# Patient Record
Sex: Female | Born: 1970 | Race: White | Hispanic: No | Marital: Married | Smoking: Former smoker
Health system: Southern US, Community
[De-identification: ages and names within clinical notes are randomized; demographics above are authoritative.]

## PROBLEM LIST (undated history)

## (undated) DIAGNOSIS — E669 Obesity, unspecified: Secondary | ICD-10-CM

## (undated) DIAGNOSIS — E282 Polycystic ovarian syndrome: Secondary | ICD-10-CM

## (undated) DIAGNOSIS — E559 Vitamin D deficiency, unspecified: Secondary | ICD-10-CM

## (undated) DIAGNOSIS — G9332 Myalgic encephalomyelitis/chronic fatigue syndrome: Secondary | ICD-10-CM

## (undated) DIAGNOSIS — D649 Anemia, unspecified: Secondary | ICD-10-CM

## (undated) DIAGNOSIS — R7989 Other specified abnormal findings of blood chemistry: Secondary | ICD-10-CM

## (undated) DIAGNOSIS — I10 Essential (primary) hypertension: Secondary | ICD-10-CM

## (undated) DIAGNOSIS — F419 Anxiety disorder, unspecified: Secondary | ICD-10-CM

## (undated) DIAGNOSIS — M2550 Pain in unspecified joint: Secondary | ICD-10-CM

## (undated) DIAGNOSIS — M1990 Unspecified osteoarthritis, unspecified site: Secondary | ICD-10-CM

## (undated) DIAGNOSIS — K589 Irritable bowel syndrome without diarrhea: Secondary | ICD-10-CM

## (undated) DIAGNOSIS — E039 Hypothyroidism, unspecified: Secondary | ICD-10-CM

## (undated) DIAGNOSIS — K829 Disease of gallbladder, unspecified: Secondary | ICD-10-CM

## (undated) DIAGNOSIS — E88819 Insulin resistance, unspecified: Secondary | ICD-10-CM

## (undated) DIAGNOSIS — I493 Ventricular premature depolarization: Secondary | ICD-10-CM

## (undated) DIAGNOSIS — R002 Palpitations: Secondary | ICD-10-CM

## (undated) DIAGNOSIS — M549 Dorsalgia, unspecified: Secondary | ICD-10-CM

## (undated) DIAGNOSIS — E8881 Metabolic syndrome: Secondary | ICD-10-CM

## (undated) DIAGNOSIS — E7800 Pure hypercholesterolemia, unspecified: Secondary | ICD-10-CM

## (undated) DIAGNOSIS — E785 Hyperlipidemia, unspecified: Secondary | ICD-10-CM

## (undated) DIAGNOSIS — R5382 Chronic fatigue, unspecified: Secondary | ICD-10-CM

## (undated) HISTORY — PX: CHOLECYSTECTOMY: SHX55

## (undated) HISTORY — DX: Chronic fatigue, unspecified: R53.82

## (undated) HISTORY — DX: Anxiety disorder, unspecified: F41.9

## (undated) HISTORY — DX: Hyperlipidemia, unspecified: E78.5

## (undated) HISTORY — DX: Ventricular premature depolarization: I49.3

## (undated) HISTORY — DX: Myalgic encephalomyelitis/chronic fatigue syndrome: G93.32

## (undated) HISTORY — DX: Other specified abnormal findings of blood chemistry: R79.89

## (undated) HISTORY — DX: Pain in unspecified joint: M25.50

## (undated) HISTORY — DX: Palpitations: R00.2

## (undated) HISTORY — DX: Pure hypercholesterolemia, unspecified: E78.00

## (undated) HISTORY — DX: Insulin resistance, unspecified: E88.819

## (undated) HISTORY — DX: Essential (primary) hypertension: I10

## (undated) HISTORY — DX: Hypothyroidism, unspecified: E03.9

## (undated) HISTORY — DX: Disease of gallbladder, unspecified: K82.9

## (undated) HISTORY — DX: Unspecified osteoarthritis, unspecified site: M19.90

## (undated) HISTORY — DX: Irritable bowel syndrome, unspecified: K58.9

## (undated) HISTORY — DX: Anemia, unspecified: D64.9

## (undated) HISTORY — DX: Vitamin D deficiency, unspecified: E55.9

## (undated) HISTORY — DX: Dorsalgia, unspecified: M54.9

## (undated) HISTORY — DX: Polycystic ovarian syndrome: E28.2

## (undated) HISTORY — DX: Obesity, unspecified: E66.9

## (undated) HISTORY — DX: Metabolic syndrome: E88.81

## (undated) HISTORY — PX: BREAST REDUCTION SURGERY: SHX8

---

## 2005-07-31 ENCOUNTER — Emergency Department (HOSPITAL_COMMUNITY): Admission: EM | Admit: 2005-07-31 | Discharge: 2005-07-31 | Payer: Self-pay | Admitting: Emergency Medicine

## 2005-08-28 ENCOUNTER — Ambulatory Visit: Payer: Self-pay | Admitting: *Deleted

## 2005-08-28 ENCOUNTER — Encounter: Payer: Self-pay | Admitting: Cardiovascular Disease

## 2005-09-17 ENCOUNTER — Ambulatory Visit: Payer: Self-pay | Admitting: *Deleted

## 2005-10-21 ENCOUNTER — Ambulatory Visit: Payer: Self-pay | Admitting: Cardiology

## 2005-10-22 ENCOUNTER — Ambulatory Visit: Payer: Self-pay

## 2005-12-10 ENCOUNTER — Emergency Department (HOSPITAL_COMMUNITY): Admission: EM | Admit: 2005-12-10 | Discharge: 2005-12-11 | Payer: Self-pay | Admitting: Emergency Medicine

## 2006-04-10 ENCOUNTER — Ambulatory Visit: Payer: Self-pay | Admitting: Family Medicine

## 2006-08-30 ENCOUNTER — Emergency Department (HOSPITAL_COMMUNITY): Admission: EM | Admit: 2006-08-30 | Discharge: 2006-08-30 | Payer: Self-pay | Admitting: Emergency Medicine

## 2006-11-19 ENCOUNTER — Ambulatory Visit: Payer: Self-pay | Admitting: Internal Medicine

## 2007-06-08 DIAGNOSIS — F411 Generalized anxiety disorder: Secondary | ICD-10-CM | POA: Insufficient documentation

## 2007-06-08 DIAGNOSIS — I10 Essential (primary) hypertension: Secondary | ICD-10-CM | POA: Insufficient documentation

## 2007-06-10 ENCOUNTER — Ambulatory Visit: Payer: Self-pay | Admitting: Family Medicine

## 2007-06-10 DIAGNOSIS — S335XXA Sprain of ligaments of lumbar spine, initial encounter: Secondary | ICD-10-CM | POA: Insufficient documentation

## 2007-11-01 ENCOUNTER — Inpatient Hospital Stay (HOSPITAL_COMMUNITY): Admission: EM | Admit: 2007-11-01 | Discharge: 2007-11-03 | Payer: Self-pay | Admitting: Emergency Medicine

## 2007-11-02 ENCOUNTER — Ambulatory Visit: Payer: Self-pay | Admitting: Internal Medicine

## 2007-11-03 ENCOUNTER — Encounter: Payer: Self-pay | Admitting: Family Medicine

## 2007-11-04 ENCOUNTER — Ambulatory Visit: Payer: Self-pay | Admitting: Family Medicine

## 2007-11-04 DIAGNOSIS — R1031 Right lower quadrant pain: Secondary | ICD-10-CM | POA: Insufficient documentation

## 2007-11-04 DIAGNOSIS — R1032 Left lower quadrant pain: Secondary | ICD-10-CM

## 2007-11-25 ENCOUNTER — Ambulatory Visit: Payer: Self-pay | Admitting: Gastroenterology

## 2008-01-26 ENCOUNTER — Encounter: Payer: Self-pay | Admitting: Family Medicine

## 2008-04-26 ENCOUNTER — Ambulatory Visit: Payer: Self-pay | Admitting: Family Medicine

## 2008-04-26 DIAGNOSIS — R002 Palpitations: Secondary | ICD-10-CM | POA: Insufficient documentation

## 2008-06-03 ENCOUNTER — Ambulatory Visit: Payer: Self-pay | Admitting: Family Medicine

## 2008-06-07 ENCOUNTER — Encounter: Payer: Self-pay | Admitting: Family Medicine

## 2008-08-08 ENCOUNTER — Telehealth: Payer: Self-pay | Admitting: Family Medicine

## 2008-12-01 ENCOUNTER — Telehealth: Payer: Self-pay | Admitting: Family Medicine

## 2009-01-10 ENCOUNTER — Telehealth: Payer: Self-pay | Admitting: Family Medicine

## 2009-08-23 ENCOUNTER — Telehealth: Payer: Self-pay | Admitting: Family Medicine

## 2009-10-25 ENCOUNTER — Telehealth: Payer: Self-pay | Admitting: Family Medicine

## 2009-11-14 ENCOUNTER — Telehealth: Payer: Self-pay | Admitting: Family Medicine

## 2010-09-10 ENCOUNTER — Other Ambulatory Visit: Payer: Self-pay | Admitting: Family Medicine

## 2010-09-10 ENCOUNTER — Encounter: Payer: Self-pay | Admitting: Family Medicine

## 2010-09-10 ENCOUNTER — Ambulatory Visit
Admission: RE | Admit: 2010-09-10 | Discharge: 2010-09-10 | Payer: Self-pay | Source: Home / Self Care | Attending: Family Medicine | Admitting: Family Medicine

## 2010-09-11 LAB — HEPATIC FUNCTION PANEL
ALT: 52 U/L — ABNORMAL HIGH (ref 0–35)
AST: 37 U/L (ref 0–37)
Albumin: 4.5 g/dL (ref 3.5–5.2)
Alkaline Phosphatase: 57 U/L (ref 39–117)
Total Bilirubin: 0.5 mg/dL (ref 0.3–1.2)

## 2010-09-11 LAB — CBC WITH DIFFERENTIAL/PLATELET
Basophils Absolute: 0.1 10*3/uL (ref 0.0–0.1)
Eosinophils Relative: 2.1 % (ref 0.0–5.0)
HCT: 46.2 % — ABNORMAL HIGH (ref 36.0–46.0)
Hemoglobin: 15.8 g/dL — ABNORMAL HIGH (ref 12.0–15.0)
Lymphocytes Relative: 27.2 % (ref 12.0–46.0)
Lymphs Abs: 2.7 10*3/uL (ref 0.7–4.0)
Monocytes Relative: 5.3 % (ref 3.0–12.0)
Neutro Abs: 6.4 10*3/uL (ref 1.4–7.7)
WBC: 9.9 10*3/uL (ref 4.5–10.5)

## 2010-09-11 LAB — BASIC METABOLIC PANEL
Calcium: 9.6 mg/dL (ref 8.4–10.5)
GFR: 114.8 mL/min (ref >60.00)
Potassium: 4.7 mEq/L (ref 3.5–5.1)
Sodium: 139 mEq/L (ref 135–145)

## 2010-09-11 LAB — LIPID PANEL
HDL: 59.8 mg/dL (ref >39.00)
VLDL: 27 mg/dL (ref 0.0–40.0)

## 2010-09-11 LAB — LDL CHOLESTEROL, DIRECT: Direct LDL: 194.4 mg/dL

## 2010-09-11 LAB — TSH: TSH: 2.14 u[IU]/mL (ref 0.35–5.50)

## 2010-09-11 NOTE — Progress Notes (Signed)
Summary: refill xanax  Phone Note Refill Request Message from:  Patient on August 23, 2009 2:16 PM  Refills Requested: Medication #1:  ALPRAZOLAM 0.5 MG  TABS three times a day as needed anxiety [facility]   Method Requested: Electronic Initial call taken by: Alfred Levins, CMA,  August 23, 2009 2:16 PM  Follow-up for Phone Call        call in #30 only with no rf. She needs an OV before any after that Follow-up by: Nelwyn Salisbury MD,  August 23, 2009 4:05 PM  Additional Follow-up for Phone Call Additional follow up Details #1::        Rx called to pharmacy Additional Follow-up by: Alfred Levins, CMA,  August 23, 2009 5:14 PM    Prescriptions: ALPRAZOLAM 0.5 MG  TABS (ALPRAZOLAM) three times a day as needed anxiety  #90 x 0   Entered by:   Alfred Levins, CMA   Authorized by:   Nelwyn Salisbury MD   Signed by:   Alfred Levins, CMA on 08/23/2009   Method used:   Telephoned to ...       [facility]  [address]  [phone]* (retail)       [address]       [place], [place]  [address]       Ph: [phone] or [phone]       Fax: [phone]   RxID:   [id number]

## 2010-09-11 NOTE — Progress Notes (Signed)
Summary: refill xanax  Phone Note From Pharmacy   Caller: [facility]  [address]  [phone]* Call For: refill  Summary of Call: request for refill on xanax 0.5 mg  three times a day prn Initial call taken by: Duard Brady LPN,  November 14, 2009 9:01 AM  Follow-up for Phone Call        as I said last time, NO, not without an OV Follow-up by: Nelwyn Salisbury MD,  November 14, 2009 12:57 PM    Prescriptions: ALPRAZOLAM 0.5 MG  TABS (ALPRAZOLAM) three times a day as needed anxiety  #0 x 0   Entered by:   Duard Brady LPN   Authorized by:   Nelwyn Salisbury MD   Signed by:   Duard Brady LPN on 98/06/9146   Method used:   Faxed to ...       [facility]  [address]  [phone]* (retail)       [address]       [place], [place]  [address]       Ph: [phone] or [phone]       Fax: [phone]   RxID:   [id number]

## 2010-09-11 NOTE — Progress Notes (Signed)
Summary: REQ FOR REFILL ON MED (alprazolam / xanax)  Phone Note Call from Patient   Caller: Patient Summary of Call: Pt called about Rx for med: alprazolam / xanax 0.5mg ...only has 1 pill left..... Pt adv that she has appt for this Friday, 10/27/2009 for med ck / refill w/ Dr Clent Ridges.  Initial call taken by: Debbra Riding,  October 25, 2009 2:28 PM  Follow-up for Phone Call        NO, she needs to see me first Follow-up by: Nelwyn Salisbury MD,  October 26, 2009 8:54 AM  Additional Follow-up for Phone Call Additional follow up Details #1::        Phone Call Completed----Noted..... Home # for pt has been d/c.... Pt will be in for appt on Friday, 10/27/2009... If pt calls before then she needs to be notified of Dr Claris Che notation and a valid c/b # will need to be obtained.  Additional Follow-up by: Debbra Riding,  October 26, 2009 9:00 AM

## 2010-09-12 ENCOUNTER — Encounter: Payer: Self-pay | Admitting: Family Medicine

## 2010-09-14 ENCOUNTER — Telehealth: Payer: Self-pay

## 2010-09-14 NOTE — Telephone Encounter (Signed)
Phone number disconnected. Letter mailed.

## 2010-09-14 NOTE — Telephone Encounter (Signed)
Message copied by Madison Hickman on Fri Sep 14, 2010  4:32 PM ------      Message from: Dwaine Deter      Created: Caleen Essex Sep 14, 2010  4:09 PM       normal

## 2010-09-19 NOTE — Letter (Signed)
Summary: Results Follow-up Letter  [facility]  [address] [place], [place] [address]   Phone: [phone]  Fax: [phone]    09/12/2010  [address] [place], [place]  [address]  Dear Ms. Kinn,   We have  been unable to reach you via phone-- number disconnected.  Attached is copy of your labs with recommendations . Call if questions         Sincerely,     Dr Kathie Rhodes. Fry,MD

## 2010-09-19 NOTE — Assessment & Plan Note (Signed)
Summary: consult re: irregular heart beats/cjr   Vital Signs:  Patient profile:   40 year old female Weight:      180 pounds O2 Sat:      97 % Temp:     98.8 degrees F Pulse rate:   102 / minute BP sitting:   140 / 94  (left arm) Cuff size:   large  Vitals Entered By: Pura Spice, RN (September 10, 2010 2:16 PM) CC: c/o heart skipping states does have panic attacks took husb xanax last night.  multiple issues needs cpx c/o hair loss pain in rt lower leg like stabbing pain   History of Present Illness: Here for a recurrence of anxiety and chest palpitations, similar to what she experienced about 5  years ago. At that time she had a full cardiologic workup including labs, a stress ECHO, and an event monitor (all of which were normal). It was felt to be due to anxiety, and in fact they resolved after she started taking Zoloft. She took this for 6 months and then stopped it. She did well until recently, when the skipping and racing started again. No chest pains or SOB, but she can feel her heart pounding in her chest. She has been under a lot of stress lately, and she thinks she is having "panic attacks". She has not slept well lately, and the palpitations are often more noticeable at night. No GERD symptoms.   Allergies: No Known Drug Allergies  Past History:  Past Medical History: Reviewed history from 04/26/2008 and no changes required. PVCs (normal workup with labs, EKG, ECHO, and treadmill per Dr. Glennon Hamilton), saw Dr. Jones Broom Anxiety Hypertension  Review of Systems  The patient denies anorexia, fever, weight loss, weight gain, vision loss, decreased hearing, hoarseness, chest pain, syncope, dyspnea on exertion, peripheral edema, prolonged cough, headaches, hemoptysis, abdominal pain, melena, hematochezia, severe indigestion/heartburn, hematuria, incontinence, genital sores, muscle weakness, suspicious skin lesions, transient blindness, difficulty walking, depression, unusual  weight change, abnormal bleeding, enlarged lymph nodes, angioedema, breast masses, and testicular masses.    Physical Exam  General:  Well-developed,well-nourished,in no acute distress; alert,appropriate and cooperative throughout examination Neck:  No deformities, masses, or tenderness noted. Chest Wall:  No deformities, masses, or tenderness noted. Lungs:  Normal respiratory effort, chest expands symmetrically. Lungs are clear to auscultation, no crackles or wheezes. Heart:  Normal rate and regular rhythm. S1 and S2 normal without gallop, murmur, click, rub or other extra sounds. EKG normal  Neurologic:  alert & oriented X3.   Psych:  Oriented X3, memory intact for recent and remote, normally interactive, good eye contact, and moderately anxious.     Impression & Recommendations:  Problem # 1:  PALPITATIONS (ICD-785.1)  The following medications were removed from the medication list:    Toprol Xl 50 Mg Xr24h-tab (Metoprolol succinate) ..... Once daily  Orders: Venipuncture (57846) TLB-BMP (Basic Metabolic Panel-BMET) (80048-METABOL) TLB-CBC Platelet - w/Differential (85025-CBCD) TLB-Hepatic/Liver Function Pnl (80076-HEPATIC) TLB-TSH (Thyroid Stimulating Hormone) (84443-TSH) TLB-Lipid Panel (80061-LIPID) Specimen Handling (96295) EKG w/ Interpretation (93000)  Problem # 2:  ANXIETY (ICD-300.00)  The following medications were removed from the medication list:    Zoloft 50 Mg Tabs (Sertraline hcl) .[name]... 1/2 tab daily Her updated medication list for this problem includes:    Alprazolam 0.5 Mg Tabs (Alprazolam) .[name]... Three times a day as needed anxiety    Zoloft 50 Mg Tabs (Sertraline hcl) ..... Once daily  Complete Medication List: 1)  Alprazolam 0.5 Mg Tabs (Alprazolam) .Marland KitchenMarland KitchenMarland Kitchen  Three times a day as needed anxiety 2)  Zoloft 50 Mg Tabs (Sertraline hcl) .... Once daily  Patient Instructions: 1)  get back on Zoloft daily.  2)  Please schedule a follow-up appointment in 1 month.    Prescriptions: ALPRAZOLAM 0.5 MG  TABS (ALPRAZOLAM) three times a day as needed anxiety  #90 x 2   Entered and Authorized by:   Nelwyn Salisbury MD   Signed by:   Nelwyn Salisbury MD on 09/10/2010   Method used:   Print then Give to Patient   RxID:   [id number] ZOLOFT 50 MG TABS (SERTRALINE HCL) once daily  #30 x 2   Entered and Authorized by:   Nelwyn Salisbury MD   Signed by:   Nelwyn Salisbury MD on 09/10/2010   Method used:   Print then Give to Patient   RxID:   [id number]    Orders Added: 1)  Venipuncture [84696] 2)  TLB-BMP (Basic Metabolic Panel-BMET) [80048-METABOL] 3)  TLB-CBC Platelet - w/Differential [85025-CBCD] 4)  TLB-Hepatic/Liver Function Pnl [80076-HEPATIC] 5)  TLB-TSH (Thyroid Stimulating Hormone) [84443-TSH] 6)  TLB-Lipid Panel [80061-LIPID] 7)  Specimen Handling [99000] 8)  Est. Patient Level IV [29528] 9)  EKG w/ Interpretation [93000]

## 2010-09-20 ENCOUNTER — Encounter: Payer: Self-pay | Admitting: Family Medicine

## 2010-09-24 ENCOUNTER — Ambulatory Visit: Payer: Self-pay | Admitting: Family Medicine

## 2010-12-25 NOTE — Assessment & Plan Note (Signed)
[facility]                         GASTROENTEROLOGY OFFICE NOTE   NAME:[name] [name]                     MRN:          [id number]  DATE:11/25/2007                            DOB:          Oct 09, 1970    PROBLEM:  Abdominal pain.   [name] [name] is a 40 year old white female referred through the courtesy  of Dr. Clent Ridges for evaluation.  Approximately 3 weeks ago she was  hospitalized for 4 days because of severe right lower quadrant pain.  Workup including blood work, CT, and ultrasound were all negative.  It  was felt that she had a gastroenteritis.  She has a history of erratic  bowels following her cholecystectomy.  For the 2 weeks prior to her  hospital admission she noted increasing diarrhea.  There is no history  of melena or hematochezia.  Since discharge she has felt well and has  had no further episodes of pain.  Stools have been intermittently loose.   PAST MEDICAL HISTORY:  Pertinent for anxiety.  She is status post  cholecystectomy.   FAMILY HISTORY:  Pertinent for a sister with ulcerative colitis.   MEDICATIONS INCLUDE:  The Xanax p.r.n.   ALLERGIES:  She has no allergies.   SOCIAL HISTORY:  She is married and works for [employer].  She smokes a pack a day.  She drinks only occasionally.   REVIEW OF SYSTEMS:  Reviewed and is positive only for back pain.   PHYSICAL EXAMINATION:  VITAL SIGNS:  Pulse 88, blood pressure 120/80,  weight 161.  HEENT: EOMI.  PERRLA.  Sclerae are anicteric.  Conjunctivae are pink.  NECK:  Supple without thyromegaly, adenopathy or carotid bruits.  CHEST:  Clear to auscultation and percussion without adventitious  sounds.  CARDIAC:  Regular rhythm; normal S1 S2.  There are no murmurs, gallops  or rubs.  ABDOMEN:  Bowel sounds are normoactive.  Abdomen is soft, nontender and  nondistended.  There are no abdominal masses, tenderness, splenic  enlargement or hepatomegaly.  EXTREMITIES:  Full  range of motion.  No cyanosis, clubbing or edema.  RECTAL:  Deferred.  MUSCULOSKELETAL:  There are no skeletal abnormalities.  NEUROLOGIC EXAM:  Nonfocal.   IMPRESSION:  Recent episode of right lower quadrant pain.  Negative  workup renders acute appendicitis and colitis very unlikely.  There is  no acute abdominal event or gynecological event such as a ruptured cyst.  At this point, I would chalk this up to a gastroenteritis.  With her  family history of colitis, I have a slightly higher index of concern  about underlying inflammatory bowel disease; but, I still think, that  this is unlikely.   RECOMMENDATIONS:  No further GI workup at this point. I carefully  instructed [name] [name] to contact me if she has recurrent bowel symptoms  including worsening diarrhea or abdominal pain.     Barbette Hair. Arlyce Dice, MD,FACG  Electronically Signed    RDK/MedQ  DD: 11/25/2007  DT: 11/25/2007  Job #: [id number]   cc:   Jeannett Senior A. Clent Ridges, MD

## 2010-12-25 NOTE — H&P (Signed)
[name], [name]            ACCOUNT NO.:  [id number]   MEDICAL RECORD NO.:  [id number]          PATIENT TYPE:  EMS   LOCATION:  MAJO                         FACILITY:  [facility]   PHYSICIAN:  Deirdre Peer. Polite, M.D. DATE OF BIRTH:  1971/02/12   DATE OF ADMISSION:  10/31/2007  DATE OF DISCHARGE:                              HISTORY & PHYSICAL   CHIEF COMPLAINT:  Abdominal pain.   HISTORY OF PRESENT ILLNESS:  A 40 year old female presents to the ED  after awakened earlier in the day with abdominal pain.  She stated the  pain was in the right lower quadrant, constant, then moved to the pubic  area, constant throughout the day appears to have moved from side to  side.  She also complains of some gas pain.  Denies any lactose  intolerance.  Denies any vomiting, denies any diarrhea, denies any  vaginal discharge.  Denies any fever or chills.  In the ED, the patient  was evaluated.  The patient was afebrile, hemodynamically stable.  The  patient had screening labs initially showed an elevated white count of  13.6.  Had a pelvic exam that showed a few clue cells, 3 wbc's, no  Trichomonas, no yeast.  UA negative.  BMET unremarkable.  The patient  had a followup CBC that showed decrease in the white count down to 11.7  with no shift.  The patient had a CT of the abdomen and pelvis which was  normal, as well as an abdominal ultrasound normal.  The patient  initially was given some IV fluids and Percocet, later given GI cocktail  and morphine.  Stated that she felt some relief with her morphine;  however, was still having fleeting abdominal pain.  Medicine was called  for further evaluation and admission.  At the time of my evaluation the  patient standing at the side of the bed, constantly writhing,  complaining of abdominal pain.  Admission is deemed necessary for  further evaluation and treatment.   PAST MEDICAL HISTORY:  Significant for:  1. Palpitations with a negative evaluation by  [facility].  2. Anxiety with p.r.n. Xanax use.   CURRENT MEDICATIONS:  P.r.n. Xanax.   SOCIAL HISTORY:  Positive for tobacco, half pack per day; social  alcohol; no drugs.   PAST SURGICAL HISTORY:  Significant for cholecystectomy approximately 11  years ago.   ALLERGIES:  None.   FAMILY HISTORY:  Mother - hypertension.  Father - history of esophageal  cancer; he is deceased.  Brother and sister are healthy.   REVIEW OF SYSTEMS:  As stated in the HPI.   PHYSICAL EXAMINATION:  The patient is oriented, moderate distress  secondary to complaints of abdominal pain.  VITAL SIGNS:  BP 125/65, temperature 98.3, pulse 87, respiratory rate  18.  HEENT:  Unremarkable.  CHEST:  Clear without rales or rhonchi.  CARDIOVASCULAR:  Regular, no S3.  ABDOMEN:  Soft, generalized discomfort greater in the right lower  quadrant.  No guarding, no rebound, no tympany.  No hepatosplenomegaly  appreciated.  EXTREMITIES:  No edema.  NEUROLOGIC:  Nonfocal.   ASSESSMENT:  1. Abdominal  pain in a patient with a negative CT, negative      ultrasound.  However, pelvic exam positive for clue cells which      could be consistent with vaginosis.  2. Anxiety.  3. Elevated WBC, questionable etiology.   Recommend the patient be admitted for pain control.  She will be given  IV fluid, analgesia.  Will have followup labs.  Will have repeat exam.  Consider treatment for vaginosis when taking p.o. adequately.      Deirdre Peer. Polite, M.D.  Electronically Signed     RDP/MEDQ  D:  11/01/2007  T:  11/01/2007  Job:  [id number]

## 2010-12-25 NOTE — Discharge Summary (Signed)
[name], [name]            ACCOUNT NO.:  [id number]   MEDICAL RECORD NO.:  [id number]          PATIENT TYPE:  OBV   LOCATION:  5120                         FACILITY:  [facility]   PHYSICIAN:  [name], MDDATE OF BIRTH:  1971/05/24   DATE OF ADMISSION:  10/31/2007  DATE OF DISCHARGE:  11/03/2007                               DISCHARGE SUMMARY   DISCHARGE DIAGNOSES:  1. Abdominal pain, suspect secondary to viral gastroenteritis, workup      here negative.  2. Anxiety.  3. Menorrhagia with normal hemoglobin, recommend outpatient GYN      followup.   HISTORY OF PRESENT ILLNESS:  [name] is a 39 year old white female  admitted on October 31, 2007, with a chief complaint of abdominal pain.  She apparently awakened earlier on the day of admission with abdominal  pain, which was in the right lower quadrant and then moved to the pubic  area.  It was constant throughout the day and appears to move from side  to side.  She complained of some gas.  She was noted to have a slightly  elevated white count of 13.6 on admission.  She underwent a pelvic exam  in the emergency department.  Wet prep noted few clue cells, 3 white  blood cells, and no Trichomonas and no yeast.  Urinalysis was negative.  She was admitted for further evaluation and treatment.   PAST MEDICAL HISTORY:  1. History of palpitations with apparently negative evaluation per      [facility].  2. Anxiety, uses p.r.n. Xanax.   COURSE OF HOSPITALIZATION:  Abdominal pain.  The patient was admitted.  Urine pregnancy screen was negative.  She underwent a CT of the abdomen  and pelvis, which was negative.  An ultrasound was also performed, which  showed normal uterus and normal ovaries.  She had a slight elevation of  white blood cell count, which trended down to normal overnight.  She is  currently tolerating p.o.  She had 2 episodes of loose stools during  this admission.  She continues to complain of gassy  discomfort and  suprapubic/right lower quadrant tenderness, although she has no guarding  and no rebound.  She has positive bowel sounds and is eating without  difficulty.  She has been afebrile.  At this time, we plan to discharge  the patient to home.  We will give her a small prescription for Vicodin  p.r.n. for pain.  She did note that she has had very heavy menstrual  periods, although there was no note of any uterine fibroids on vaginal  ultrasound.  It was recommended to the patient that she establish care  with her local gynecologist for further followup.  The patient does  appear quite anxious, concerned that she may have an ectopic pregnancy  or underlying malignancy.  Reassured the patient that there was no sign  of ectopic pregnancy on CT, and that her urine pregnancy test was  negative.  There were no signs of any large masses or such on CT either.  She was, however, instructed to call Dr. Clent [name] or return to the emergency  department, should she develop fever of 101, increasing abdominal pain,  or inability to keep down fluid and medicines.   PERTINENT LABORATORY:  At the time of discharge, hemoglobin 13.5,  hematocrit 38.6, white blood cell count 8.5, platelets 211, amylase and  lipase normal, BUN 4, creatinine 0.62.   DISPOSITION:  The patient will be discharged to home.   FOLLOWUP:  She is scheduled to follow up with Dr. Gershon [name] on November 04, 2007, at 9:30 a.m.      [name] Craze, NP      [name] Rover. Felicity Coyer, MD  Electronically Signed    MO/MEDQ  D:  11/03/2007  T:  11/04/2007  Job:  [id number]   cc:   [name] Senior A. Clent Ridges, MD

## 2010-12-28 NOTE — Assessment & Plan Note (Signed)
[name] OFFICE NOTE   NAME:[name], [name]                     MRN:          [id number]  DATE:04/10/2006                            DOB:          1971/07/14    This is a 40 year old woman here to establish with our practice who is  complaining of elevated blood pressures.  She has had palpitations for the  past year or so and has seen Dr. Cecil Cranker on a number of occasions  for this.  This was deemed to be simple PVCs only, and she was told that it  had no medical consequence.  Her work up thus far has included a treadmill  stress test, lab work, EKGs, and even an echocardiogram.  I do not have  access to actual results of these tests, but verbally today, she tells me  that they were all normal.  Clearly her PVCs are exacerbated by stress, and  she has been under quite a bit of stress lately.  She and her family moved  to [place] from [place], [place] about a year ago due to a job  relocation for her husband.  She has found the move to be extremely  stressful, and, in fact, has been seeing Dr. Bobby Rumpf for psychiatric help for  this.  Three weeks ago, she was started on Zoloft, and they are slowly  tapering up on her dose.  She uses Xanax periodically as well.  She says she  feels a little better than she did, but realizes that anxiety plays a large  part in what she is feeling.  Also for the first time over the past several  weeks, she has noted some elevations in her blood pressure.  She has checked  it at a pharmacy a couple of times, and her mother has a blood pressure cuff  that she has used several times as well.  She has gotten several readings in  the 140 systolic and 93-100 diastolic over the past several weeks, and other  times her blood pressure is normal.  She denies any chest pain, shortness of  breath, or other problems.   PAST MEDICAL HISTORY:  She has had 4 vaginal deliveries.   Her last pap smear  was in 2006.  She does plan to come back to see [place] in the near future for  another complete physical as well as pap smear.  She has never had a  mammogram.  She has had a cholecystectomy.   ALLERGIES:  None.   CURRENT MEDICATIONS:  1. Zoloft 12.5 mg per day.  2. Xanax 0.5 mg as needed.   HABITS:  She drinks some alcohol.  She smokes 1 pack per day of cigarettes.   SOCIAL HISTORY:  She is a housewife.  She is married with 4 children, and  her mother lives with her as well.   FAMILY HISTORY:  Remarkable for alcoholism and hypertension.   OBJECTIVE:  HEIGHT:  5'5.  Weight 169.  Blood pressure 120/80.  Pulse 100  and regular.  GENERAL:  She is a  little overweight.  She is fairly anxious.  NECK:  Supple without lymphadenopathy or masses.  LUNGS:  Clear.  CARDIAC:  Rate and rhythm are regular without gallops, murmurs, rubs.  Pulses are full.  EKG is within normal limits.  EXTREMITIES:  No edema.   ASSESSMENT/PLAN:  1. PVCs.  I reassured her that these are usually a benign phenomenon, and      that stress and smoking can make them worse.  We will try to get them      under control as below.  She will follow up as needed.  2. Elevated blood pressures.  I think at this point we could treat with      non-pharmaceutical measures.  I have advised her to get regular      exercise, lose some weight, and stop smoking immediately.  We will      continue to follow this closely and treat if needed.  3. Anxiety.  She will follow up with Dr. Bobby Rumpf.                                   Tera Mater. Clent Ridges, MD   SAF/MedQ  DD:  04/10/2006  DT:  04/11/2006  Job #:  [id number]

## 2010-12-28 NOTE — Assessment & Plan Note (Signed)
[facility]                            CARDIOLOGY OFFICE NOTE   NAME:[name], [name]                     MRN:          [id number]  DATE:11/19/2006                            DOB:          10-13-1970    This is a very pleasant 40 year old married white female with a history  of palpitations.  She had an echocardiogram in 2007 with normal LV size  and function.  She had stress echo in March 2007, exercise 9 minutes, 10  seconds.  Target heart rate was achieved.  The echo images were very  good.  There was no signs of scar or ischemia.  She has had a normal  TSH, and she has had an event recorder with rare PACs and PVCs, and  sinus tachycardia at 112 beats per minute.  She saw Lorin Picket last year and  was started on Lexapro, which she says she never took, and followed up  with primary who is Dr. Ander Slade in [place].  She saw him this morning who  recommended to resume Zoloft.   The patient says last year she was started on Zoloft by Dr. Ander Slade, and  actually, her symptoms improved.  Back in November she was feeling  better so she stopped her Zoloft and did well over the past 3 months.  Two weeks ago she began having palpitations once again.  She says she  feels these beats of skipping and pounding, and she becomes very anxious  and has what she describes as a panic attack.  She feels very limited by  these, she is afraid to leave her house because she has a fear of this  going into prolonged arrhythmia and she is scared to be out if this  would happen, even though this has never happened.  She does take Xanax  on a p.r.n. basis.  She drinks no caffeine, but she does smoke less than  a pack of cigarettes a day.  Dr. Ander Slade asked her to start her Zoloft this  evening which she is willing to do, but she does not like taking  medications in general, so was hoping not to have to do this.   CURRENT MEDICATIONS:  1. Xanax 0.5 mg 1/2 b.i.d. p.r.n.  2. Folic acid 400 mEq  daily.  3. B12 500 daily.  4. Multivitamin daily.   PHYSICAL EXAMINATION:  This is an anxious 40 year old white female in no  acute distress.  Blood pressure 130/90, pulse 97, weight 168.  NECK:  Without JVD, HDR, bruit, or thyroid enlargement.  LUNGS:  Clear anterior, posterior, and lateral.  HEART:  Regular rate and rhythm at 90 beats per minute.  Normal S1, S2.  No murmur, rub, bruit, thrill, or heave noted.  ABDOMEN:  Soft without organomegaly, masses, lesions, or abnormal  tenderness.  EXTREMITIES:  Without cyanosis, clubbing, or edema.  She has good distal  pulses.   EKG normal sinus rhythm, no acute change on EKG.   IMPRESSION:  1. Palpitations associated with anxiety.  2. Documented premature atrial contractions and premature ventricular      contractions on prior event recorder.  3. Normal stress echo in March 2007.  4. Stress and anxiety.   PLAN:  I had a long discussion with the patient and told her her  premature ventricular contractions and premature atrial contractions are  not life threatening.  I told her if she would have prolonged racing or  arrhythmias that she should come to the emergency room.  I have offered  her a low dose beta blocker to take on a p.r.n. basis since she does not  want to take daily medications which she seems happy to try.  I have  encouraged her to quit smoking all together as her husband is trying to  quit at this time, and told her that this would also help.  She has had  a fear of exercising because of it getting worse, and I have told her  she can also get on an exercise program, and this may also benefit her.  If the Zoloft that Dr. Ander Slade has prescribed and the p.r.n. Lopressor do  not help she is to call, and she has an appointment to see Dr. Gala Romney  in follow up in light of Dr. Blossom Hoops retirement.   ADDENDUM:  She does admit to being under quite a bit of stress since she  moved here from [place], and trying to adjust to  life here.      Jacolyn Reedy, PA-C  Electronically Signed      Bevelyn Buckles. Bensimhon, MD  Electronically Signed   ML/MedQ  DD: 11/19/2006  DT: 11/19/2006  Job #: [id number]

## 2011-04-18 ENCOUNTER — Other Ambulatory Visit: Payer: Self-pay | Admitting: Family Medicine

## 2011-04-19 NOTE — Telephone Encounter (Signed)
Call in #[phone] with 5 rf 

## 2011-04-19 NOTE — Telephone Encounter (Signed)
Rx request for alprazolam 0.5 mg; pt last seen on 09/10/10. Pls advise

## 2011-04-23 ENCOUNTER — Other Ambulatory Visit: Payer: Self-pay

## 2011-04-23 NOTE — Telephone Encounter (Signed)
Rx request for alprazolam 0.5 mg. Pt last seen on 09/10/10. Pls advise.

## 2011-04-23 NOTE — Telephone Encounter (Signed)
Pt called regarding her Rx. Pt only has 3 days left. Please contact pt when prescription is sent

## 2011-04-24 NOTE — Telephone Encounter (Signed)
Script called in

## 2011-04-24 NOTE — Telephone Encounter (Signed)
Call in #[phone] with 5 rf 

## 2011-05-06 LAB — CBC
HCT: 37.8
HCT: 41.8
Hemoglobin: 12.9
Hemoglobin: 14.4
MCHC: 34.3
MCHC: 34.3
MCHC: 34.8
MCHC: 35
MCV: 97.2
MCV: 97.6
MCV: 97.7
MCV: 98
Platelets: 211
Platelets: 225
RBC: 4.27
RDW: 12.6
RDW: 12.7
WBC: 11.7 — ABNORMAL HIGH
WBC: 13.6 — ABNORMAL HIGH
WBC: 8.5

## 2011-05-06 LAB — DIFFERENTIAL
Basophils Relative: 1
Basophils Relative: 2 — ABNORMAL HIGH
Eosinophils Absolute: 0.4
Eosinophils Absolute: 0.4
Eosinophils Relative: 3
Lymphs Abs: 3.9
Lymphs Abs: 4.1 — ABNORMAL HIGH
Monocytes Absolute: 1
Monocytes Relative: 7
Neutro Abs: 6.4
Neutrophils Relative %: 55

## 2011-05-06 LAB — URINALYSIS, ROUTINE W REFLEX MICROSCOPIC
Glucose, UA: NEGATIVE
Ketones, ur: NEGATIVE
Nitrite: NEGATIVE
Protein, ur: NEGATIVE

## 2011-05-06 LAB — POCT I-STAT CREATININE: Operator id: 151321

## 2011-05-06 LAB — BASIC METABOLIC PANEL
CO2: 27
Calcium: 8.6
Chloride: 109
Glucose, Bld: 105 — ABNORMAL HIGH
Sodium: 141

## 2011-05-06 LAB — RAPID URINE DRUG SCREEN, HOSP PERFORMED
Barbiturates: NOT DETECTED
Cocaine: NOT DETECTED
Opiates: POSITIVE — AB

## 2011-05-06 LAB — I-STAT 8, (EC8 V) (CONVERTED LAB)
Chloride: 108
Glucose, Bld: 96
Hemoglobin: 14.6
Potassium: 4
Sodium: 139
TCO2: 26

## 2011-05-06 LAB — GC/CHLAMYDIA PROBE AMP, GENITAL: Chlamydia, DNA Probe: NEGATIVE

## 2011-05-06 LAB — POCT PREGNANCY, URINE: Operator id: 151321

## 2012-05-08 ENCOUNTER — Encounter: Payer: Self-pay | Admitting: Family Medicine

## 2012-05-08 DIAGNOSIS — Z0289 Encounter for other administrative examinations: Secondary | ICD-10-CM

## 2013-12-22 ENCOUNTER — Telehealth: Payer: Self-pay | Admitting: Family Medicine

## 2013-12-22 NOTE — Telephone Encounter (Signed)
Per Dr. Sarajane Jews request he wants patient to go to the ED. Pt is informed to go to ED.

## 2013-12-22 NOTE — Telephone Encounter (Addendum)
Pt was triaged this am w/ you and you advised pt to go to ED. Pt would like a fup ASAP bc she said they did not resolve anything at the ed.  However, pt has never seen you or anyone else here. She had scheduled an appt in 04/2012 and was a no show and cancelled a 2012 appt.   Pt 's husband is a pt of yours, but you have never seen this pt.  Is it ok to scheduled pt a post hosp fup?

## 2013-12-22 NOTE — Telephone Encounter (Signed)
Patient Information:  Caller Name: [name]  Phone: [phone]  Patient: [name], [name]  Gender: Female  DOB: Aug 31, 1970  Age: 43 Years  PCP: Alysia Penna [facility])  Pregnant: No  Office Follow Up:  Does the office need to follow up with this patient?: Yes  Instructions For The Office: Call and give pt instructions per note.   Symptoms  Reason For Call & Symptoms: 12/21/13 OM was having heart palpitations at 2200 and then 0300, 12/22/13.  If she laid on her back with her feet elevated it stopped.  She has 1 diarrhea stool this AM   She took .25 mg of Xanax this AM to calm down.   No longer on Zoloft.  Pt is having SOB with exertion, but no chest pain,.  She has been having pain in the left sde of her neck for about 1 month.  Called Saundrea in the office and instructed to send note to the office so she can review with Dr.  Deborha Payment will call the pt back.  Reviewed Health History In EMR: Yes  Reviewed Medications In EMR: Yes  Reviewed Allergies In EMR: Yes  Reviewed Surgeries / Procedures: Yes  Date of Onset of Symptoms: 12/21/2013  Treatments Tried: Xanax  Treatments Tried Worked: No OB / GYN:  LMP: 12/03/2013  Guideline(s) Used:  Heart Rate and Heartbeat Questions  Disposition Per Guideline:   Go to ED Now (or to Office with PCP Approval)  Reason For Disposition Reached:   New or worsened shortness of breath with activity (dyspnea on exertion)  Advice Given:  N/A  Patient Will Follow Care Advice:  YES

## 2013-12-22 NOTE — Telephone Encounter (Signed)
Sorry but I cannot take her on since I am too busy. I suggest she see either Roxy Cedar or Bebe Liter

## 2013-12-23 NOTE — Telephone Encounter (Signed)
Informed pt of dr fry's decision. Encouraged pt to establish w/ dr Maudie Mercury. Pt refused and states she will find another dr. Abbott [name] was upset and and did not understand nor agree w/ our 3 yr policy of not being seen.

## 2013-12-27 ENCOUNTER — Ambulatory Visit (INDEPENDENT_AMBULATORY_CARE_PROVIDER_SITE_OTHER): Payer: PRIVATE HEALTH INSURANCE | Admitting: Physician Assistant

## 2013-12-27 ENCOUNTER — Encounter: Payer: Self-pay | Admitting: Physician Assistant

## 2013-12-27 VITALS — BP 132/86 | HR 91 | Ht 65.0 in | Wt 185.0 lb

## 2013-12-27 DIAGNOSIS — R002 Palpitations: Secondary | ICD-10-CM

## 2013-12-27 DIAGNOSIS — I10 Essential (primary) hypertension: Secondary | ICD-10-CM

## 2013-12-27 DIAGNOSIS — Z1239 Encounter for other screening for malignant neoplasm of breast: Secondary | ICD-10-CM

## 2013-12-27 DIAGNOSIS — F411 Generalized anxiety disorder: Secondary | ICD-10-CM

## 2013-12-27 MED ORDER — ALPRAZOLAM 0.5 MG PO TBDP: 0.5000 mg | ORAL_TABLET | Freq: Two times a day (BID) | ORAL | Status: DC | PRN

## 2013-12-27 MED ORDER — SERTRALINE HCL 50 MG PO TABS: 50.0000 mg | ORAL_TABLET | Freq: Every day | ORAL | Status: DC

## 2013-12-28 NOTE — Progress Notes (Signed)
Subjective:    Patient ID: [name], female    DOB: 1971/08/02, 43 y.o.   MRN: [id number]  HPI Pt is a 43 yo female who presents to the clinic to establish care.   .. Active Ambulatory Problems    Diagnosis Date Noted  . ANXIETY 06/08/2007  . HYPERTENSION 06/08/2007  . PALPITATIONS 04/26/2008  . ABDOMINAL PAIN RIGHT LOWER QUADRANT 11/04/2007  . LUMBAR STRAIN 06/10/2007   Resolved Ambulatory Problems    Diagnosis Date Noted  . No Resolved Ambulatory Problems   Past Medical History  Diagnosis Date  . Anxiety   . Hypertension   . Hyperlipidemia    . Family History  Problem Relation Age of Onset  . Alcohol abuse      family hx  . Arthritis      family hx  . Hypertension      family hx  . Stroke      <50 family hx  . Colitis    . Hyperlipidemia Mother   . Hypertension Mother   . Cancer Father   . Alcohol abuse Father    . History   Social History  . Marital Status: Married    Spouse Name: N/A    Number of Children: N/A  . Years of Education: N/A   Occupational History  . Not on file.   Social History Main Topics  . Smoking status: Former Research scientist (life sciences)  . Smokeless tobacco: Not on file  . Alcohol Use: Yes  . Drug Use: No  . Sexual Activity: Yes   Other Topics Concern  . Not on file   Social History Narrative  . No narrative on file   Pt follows up from ED last Wednesday 12/22/13. She was having bad palpitations. Full labs and cardaic work up was done and negative. Negative for TSH, anemic, cardaic enzymes. One liver enzyme was a little elevated. No acute finding. She was given metoprolol to start before palpitation or when needed. She does not want to take due to hx of BB making her feel like crap. She has had palpitations for 17 years. Seems to be getting worse. Trigger is 10 days before her period. Once her period starts seems to help relieve symptoms. No caffiene for 17 years. Occasionally will drink wine. Gained 20lbs over last year. zoloft did  help anxiety and thus palpitations but she didn't have insurance and went off of it. Would like to try again.    Review of Systems  All other systems reviewed and are negative.      Objective:   Physical Exam  Constitutional: She is oriented to person, place, and time. She appears well-developed and well-nourished.  HENT:  Head: Normocephalic and atraumatic.  Cardiovascular: Normal rate, regular rhythm and normal heart sounds.   Pulmonary/Chest: Effort normal and breath sounds normal.  Neurological: She is alert and oriented to person, place, and time.  Skin: Skin is dry.  Psychiatric: She has a normal mood and affect. Her behavior is normal.          Assessment & Plan:  Palpitations- discussed palpitations and being around her period. Perhaps there is some PMS trigger. I do think adding an anti-depressant/anti-anxiety might be benefical. I did encourage use of metoprolol however pt had bad experience with BB in past. She does not want to try at this point. Pt was given xanax to use as needed for acute Palpitations that cause panic attacks. Pt has had in the past and not used more  than one every couple of days. I would also like to set pt up with holter monitor over 10 days before period to further evaluate palpitations.   Anxiety/PMS- will start zoloft 50mg  daily. Pt will do her own taper up since she does not like to advance medications very fast. Encouraged walking and being outside to help with anxiety and mood. F/u in 4 weeks.    Obesity/abnormal weight gain- will discuss at future appt. I do think working on weight loss can help you feel better and be a natural treatment for anxiety.   Pt needs CPE and fasting labs.

## 2014-01-14 ENCOUNTER — Other Ambulatory Visit: Payer: Self-pay | Admitting: *Deleted

## 2014-01-14 DIAGNOSIS — R002 Palpitations: Secondary | ICD-10-CM

## 2014-01-17 ENCOUNTER — Encounter: Payer: Self-pay | Admitting: *Deleted

## 2014-01-17 ENCOUNTER — Encounter (INDEPENDENT_AMBULATORY_CARE_PROVIDER_SITE_OTHER): Payer: PRIVATE HEALTH INSURANCE

## 2014-01-17 DIAGNOSIS — R002 Palpitations: Secondary | ICD-10-CM

## 2014-01-17 DIAGNOSIS — F411 Generalized anxiety disorder: Secondary | ICD-10-CM

## 2014-01-17 NOTE — Progress Notes (Signed)
Patient ID: [name], female   DOB: 16-Jul-1971, 43 y.o.   MRN: [id number] ECardio 24 hour holter monitor applied to patient.

## 2014-01-18 ENCOUNTER — Ambulatory Visit (INDEPENDENT_AMBULATORY_CARE_PROVIDER_SITE_OTHER): Payer: PRIVATE HEALTH INSURANCE

## 2014-01-18 DIAGNOSIS — R928 Other abnormal and inconclusive findings on diagnostic imaging of breast: Secondary | ICD-10-CM

## 2014-01-19 ENCOUNTER — Encounter: Payer: Self-pay | Admitting: Physician Assistant

## 2014-01-19 DIAGNOSIS — N6310 Unspecified lump in the right breast, unspecified quadrant: Secondary | ICD-10-CM | POA: Insufficient documentation

## 2014-01-21 ENCOUNTER — Other Ambulatory Visit: Payer: Self-pay | Admitting: Physician Assistant

## 2014-01-21 DIAGNOSIS — R928 Other abnormal and inconclusive findings on diagnostic imaging of breast: Secondary | ICD-10-CM

## 2014-01-24 ENCOUNTER — Encounter: Payer: PRIVATE HEALTH INSURANCE | Admitting: Physician Assistant

## 2014-01-26 ENCOUNTER — Encounter: Payer: Self-pay | Admitting: Physician Assistant

## 2014-01-26 ENCOUNTER — Telehealth: Payer: Self-pay | Admitting: Physician Assistant

## 2014-01-26 ENCOUNTER — Ambulatory Visit (INDEPENDENT_AMBULATORY_CARE_PROVIDER_SITE_OTHER): Payer: PRIVATE HEALTH INSURANCE | Admitting: Physician Assistant

## 2014-01-26 VITALS — BP 125/86 | HR 101 | Ht 65.0 in | Wt 183.0 lb

## 2014-01-26 DIAGNOSIS — Z131 Encounter for screening for diabetes mellitus: Secondary | ICD-10-CM

## 2014-01-26 DIAGNOSIS — I498 Other specified cardiac arrhythmias: Secondary | ICD-10-CM

## 2014-01-26 DIAGNOSIS — R002 Palpitations: Secondary | ICD-10-CM

## 2014-01-26 DIAGNOSIS — R000 Tachycardia, unspecified: Secondary | ICD-10-CM | POA: Insufficient documentation

## 2014-01-26 DIAGNOSIS — Z0000 Encounter for general adult medical examination without abnormal findings: Secondary | ICD-10-CM

## 2014-01-26 DIAGNOSIS — F411 Generalized anxiety disorder: Secondary | ICD-10-CM

## 2014-01-26 DIAGNOSIS — Z13220 Encounter for screening for lipoid disorders: Secondary | ICD-10-CM

## 2014-01-26 LAB — CBC WITH DIFFERENTIAL/PLATELET
Basophils Absolute: 0 10*3/uL (ref 0.0–0.1)
Basophils Relative: 0 % (ref 0–1)
EOS PCT: 2 % (ref 0–5)
Eosinophils Absolute: 0.2 10*3/uL (ref 0.0–0.7)
HCT: 44 % (ref 36.0–46.0)
HEMOGLOBIN: 15.7 g/dL — AB (ref 12.0–15.0)
LYMPHS ABS: 2.7 10*3/uL (ref 0.7–4.0)
LYMPHS PCT: 31 % (ref 12–46)
MCH: 33.8 pg (ref 26.0–34.0)
MCHC: 35.7 g/dL (ref 30.0–36.0)
MCV: 94.8 fL (ref 78.0–100.0)
MONOS PCT: 5 % (ref 3–12)
Monocytes Absolute: 0.4 10*3/uL (ref 0.1–1.0)
Neutro Abs: 5.3 10*3/uL (ref 1.7–7.7)
Neutrophils Relative %: 62 % (ref 43–77)
PLATELETS: 326 10*3/uL (ref 150–400)
RBC: 4.64 MIL/uL (ref 3.87–5.11)
RDW: 13.1 % (ref 11.5–15.5)
WBC: 8.6 10*3/uL (ref 4.0–10.5)

## 2014-01-26 MED ORDER — ALPRAZOLAM 0.5 MG PO TABS: 0.5000 mg | ORAL_TABLET | Freq: Every evening | ORAL | Status: DC | PRN

## 2014-01-26 NOTE — Progress Notes (Signed)
   Subjective:    Patient ID: [name], female    DOB: 05/27/1971, 43 y.o.   MRN: [id number]  HPI    Review of Systems     Objective:   Physical Exam        Assessment & Plan:     Subjective:     [name] is a 43 y.o. female and is here for a comprehensive physical exam. The patient reports problems - she continues to have palpitations and tachycardia. she wants to know results of her holter monitor. .  She stopped zoloft. She did not see any benefit and just doesn't want to be on those kind of medications.  History   Social History  . Marital Status: Married    Spouse Name: N/A    Number of Children: N/A  . Years of Education: N/A   Occupational History  . Not on file.   Social History Main Topics  . Smoking status: Former Research scientist (life sciences)  . Smokeless tobacco: Not on file  . Alcohol Use: Yes  . Drug Use: No  . Sexual Activity: Yes   Other Topics Concern  . Not on file   Social History Narrative  . No narrative on file   Health Maintenance  Topic Date Due  . Pap Smear  07/23/1987  . Tetanus/tdap  07/22/1988  . Influenza Vaccine  03/12/2014  . Mammogram  01/20/2015    The following portions of the patient's history were reviewed and updated as appropriate: allergies, current medications, past family history, past medical history, past social history, past surgical history and problem list.  Review of Systems A comprehensive review of systems was negative.   Objective:    BP 125/86  Pulse 101  Ht 5\' 5"  (1.651 m)  Wt 183 lb (83.008 kg)  BMI 30.45 kg/m2  LMP 01/21/2014 General appearance: alert, cooperative and appears stated age Head: Normocephalic, without obvious abnormality, atraumatic Eyes: conjunctivae/corneas clear. PERRL, EOM's intact. Fundi benign. Ears: normal TM's and external ear canals both ears Nose: Nares normal. Septum midline. Mucosa normal. No drainage or sinus tenderness. Throat: lips, mucosa, and tongue normal;  teeth and gums normal Neck: no adenopathy, no carotid bruit, no JVD, supple, symmetrical, trachea midline and thyroid not enlarged, symmetric, no tenderness/mass/nodules Back: symmetric, no curvature. ROM normal. No CVA tenderness. Lungs: clear to auscultation bilaterally Heart: regular rate and rhythm, S1, S2 normal, no murmur, click, rub or gallop tachycardia at 101. Abdomen: soft, non-tender; bowel sounds normal; no masses,  no organomegaly Extremities: extremities normal, atraumatic, no cyanosis or edema Pulses: 2+ and symmetric Skin: Skin color, texture, turgor normal. No rashes or lesions Lymph nodes: Cervical, supraclavicular, and axillary nodes normal. Neurologic: Grossly normal    Assessment:    Healthy female exam.      Plan:    CPE-ordered fasting labs. Declined Tdap today. Scheduled for mammogram. Discussed calcium 1200mg  and vitamin D 800 units.   Sinus tachycardia/anxiety- holter moniter- NSR sinus tachycardia up to 117bpm sinus tachycardia. Gave samples of bystolic 5mg  daily. Pt is concerned it could be her hormones. Will check labs tSH, LH, Fsh etc.  Recheck in 4 weeks.  See After Visit Summary for Counseling Recommendations

## 2014-01-26 NOTE — Telephone Encounter (Signed)
Please call pt: Holter monitor results showed NSR that went into sinus tachycardia. No extra or concerning beats. bystolic should really help this.

## 2014-01-26 NOTE — Telephone Encounter (Signed)
Pt.notified

## 2014-01-26 NOTE — Patient Instructions (Signed)
Keeping You Healthy  Get These Tests 1. Blood Pressure- Have your blood pressure checked once a year by your health care Cuauhtemoc Huegel.  Normal blood pressure is 120/80. 2. Weight- Have your body mass index (BMI) calculated to screen for obesity.  BMI is measure of body fat based on height and weight.  You can also calculate your own BMI at GravelBags.it. 3. Cholesterol- Have your cholesterol checked every 5 years starting at age 45 then yearly starting at age 65. 61. Chlamydia, HIV, and other sexually transmitted diseases- Get screened every year until age 25, then within three months of each new sexual Brionna Romanek. 5. Pap Smear- Every 1-3 years; discuss with your health care Adline Kirshenbaum. 6. Mammogram- Every year starting at age 64  Take these medicines  Calcium with Vitamin D-Your body needs 1200 mg of Calcium each day and [phone] IU of Vitamin D daily.  Your body can only absorb 500 mg of Calcium at a time so Calcium must be taken in 2 or 3 divided doses throughout the day.  Multivitamin with folic acid- Once daily if it is possible for you to become pregnant.  Get these Immunizations  Tetanus shot- Every 10 years.  Flu shot-Every year.  Take these steps 1. Do not smoke-Your healthcare Jazmine Longshore can help you quit.  For tips on how to quit go to www.smokefree.gov or call 1-800 QUITNOW. 2. Be physically active- Exercise 5 days a week for at least 30 minutes.  If you are not already physically active, start slow and gradually work up to 30 minutes of moderate physical activity.  Examples of moderate activity include walking briskly, dancing, swimming, bicycling, etc. 3. Breast Cancer- A self breast exam every month is important for early detection of breast cancer.  For more information and instruction on self breast exams, ask your healthcare Deanda Ruddell or https://www.patel.info/. 4. Eat a healthy diet- Eat a variety of healthy foods such as fruits, vegetables, whole  grains, low fat milk, low fat cheeses, yogurt, lean meats, poultry and fish, beans, nuts, tofu, etc.  For more information go to www. Thenutritionsource.org 5. Drink alcohol in moderation- Limit alcohol intake to one drink or less per day. Never drink and drive. 6. Depression- Your emotional health is as important as your physical health.  If you're feeling down or losing interest in things you normally enjoy please talk to your healthcare Yovany Clock about being screened for depression. 7. Dental visit- Brush and floss your teeth twice daily; visit your dentist twice a year. 8. Eye doctor- Get an eye exam at least every 2 years. 9. Helmet use- Always wear a helmet when riding a bicycle, motorcycle, rollerblading or skateboarding. 18. Safe sex- If you may be exposed to sexually transmitted infections, use a condom. 11. Seat belts- Seat belts can save your live; always wear one. 12. Smoke/Carbon Monoxide detectors- These detectors need to be installed on the appropriate level of your home. Replace batteries at least once a year. 13. Skin cancer- When out in the sun please cover up and use sunscreen 15 SPF or higher. 14. Violence- If anyone is threatening or hurting you, please tell your healthcare Marvelene Stoneberg.

## 2014-01-27 LAB — LIPID PANEL
CHOL/HDL RATIO: 5.1 ratio
Cholesterol: 235 mg/dL — ABNORMAL HIGH (ref 0–200)
HDL: 46 mg/dL (ref >39)
LDL CALC: 163 mg/dL — AB (ref 0–99)
TRIGLYCERIDES: 129 mg/dL (ref <150)
VLDL: 26 mg/dL (ref 0–40)

## 2014-01-27 LAB — COMPLETE METABOLIC PANEL WITH GFR
ALK PHOS: 59 U/L (ref 39–117)
ALT: 32 U/L (ref 0–35)
AST: 18 U/L (ref 0–37)
Albumin: 4.4 g/dL (ref 3.5–5.2)
BILIRUBIN TOTAL: 0.4 mg/dL (ref 0.2–1.2)
BUN: 6 mg/dL (ref 6–23)
CO2: 24 mEq/L (ref 19–32)
CREATININE: 0.66 mg/dL (ref 0.50–1.10)
Calcium: 9.7 mg/dL (ref 8.4–10.5)
Chloride: 103 mEq/L (ref 96–112)
GFR, Est Non African American: >89 mL/min
Glucose, Bld: 95 mg/dL (ref 70–99)
Potassium: 4.8 mEq/L (ref 3.5–5.3)
SODIUM: 139 meq/L (ref 135–145)
Total Protein: 7 g/dL (ref 6.0–8.3)

## 2014-01-27 LAB — FOLLICLE STIMULATING HORMONE: FSH: 8.8 m[IU]/mL

## 2014-01-27 LAB — LUTEINIZING HORMONE: LH: 3.3 m[IU]/mL

## 2014-01-27 LAB — TSH: TSH: 1.74 u[IU]/mL (ref 0.350–4.500)

## 2014-01-27 LAB — FERRITIN: FERRITIN: 112 ng/mL (ref 10–291)

## 2014-01-31 ENCOUNTER — Encounter: Payer: Self-pay | Admitting: Physician Assistant

## 2014-01-31 DIAGNOSIS — E785 Hyperlipidemia, unspecified: Secondary | ICD-10-CM | POA: Insufficient documentation

## 2014-02-01 ENCOUNTER — Ambulatory Visit
Admission: RE | Admit: 2014-02-01 | Discharge: 2014-02-01 | Disposition: A | Discharge status: Home / Self Care | Payer: 59 | Source: Ambulatory Visit | Attending: Physician Assistant | Admitting: Physician Assistant

## 2014-02-01 DIAGNOSIS — R928 Other abnormal and inconclusive findings on diagnostic imaging of breast: Secondary | ICD-10-CM

## 2014-02-02 ENCOUNTER — Encounter: Payer: Self-pay | Admitting: Physician Assistant

## 2014-02-02 LAB — ESTRADIOL, FREE
ESTRADIOL FREE: 1.58 pg/mL
Estradiol: 83 pg/mL

## 2014-02-24 ENCOUNTER — Other Ambulatory Visit: Payer: Self-pay | Admitting: Physician Assistant

## 2014-05-27 ENCOUNTER — Other Ambulatory Visit: Payer: Self-pay

## 2014-05-30 ENCOUNTER — Other Ambulatory Visit (HOSPITAL_COMMUNITY)
Admission: RE | Admit: 2014-05-30 | Discharge: 2014-05-30 | Disposition: A | Discharge status: Home / Self Care | Payer: 59 | Source: Ambulatory Visit | Attending: Physician Assistant | Admitting: Physician Assistant

## 2014-05-30 ENCOUNTER — Encounter: Payer: Self-pay | Admitting: Physician Assistant

## 2014-05-30 ENCOUNTER — Ambulatory Visit (INDEPENDENT_AMBULATORY_CARE_PROVIDER_SITE_OTHER): Payer: 59 | Admitting: Physician Assistant

## 2014-05-30 VITALS — BP 115/81 | HR 75 | Ht 65.0 in | Wt 188.0 lb

## 2014-05-30 DIAGNOSIS — R002 Palpitations: Secondary | ICD-10-CM

## 2014-05-30 DIAGNOSIS — Z01419 Encounter for gynecological examination (general) (routine) without abnormal findings: Secondary | ICD-10-CM

## 2014-05-30 DIAGNOSIS — Z1151 Encounter for screening for human papillomavirus (HPV): Secondary | ICD-10-CM | POA: Insufficient documentation

## 2014-05-30 DIAGNOSIS — R5383 Other fatigue: Secondary | ICD-10-CM

## 2014-05-30 DIAGNOSIS — Z113 Encounter for screening for infections with a predominantly sexual mode of transmission: Secondary | ICD-10-CM | POA: Insufficient documentation

## 2014-05-30 DIAGNOSIS — I493 Ventricular premature depolarization: Secondary | ICD-10-CM

## 2014-05-30 DIAGNOSIS — F411 Generalized anxiety disorder: Secondary | ICD-10-CM

## 2014-05-30 DIAGNOSIS — N760 Acute vaginitis: Secondary | ICD-10-CM | POA: Insufficient documentation

## 2014-05-30 MED ORDER — NEBIVOLOL HCL 2.5 MG PO TABS: ORAL_TABLET | ORAL | Status: DC

## 2014-05-30 MED ORDER — CITALOPRAM HYDROBROMIDE 20 MG PO TABS: ORAL_TABLET | ORAL | Status: DC

## 2014-05-30 MED ORDER — ALPRAZOLAM 0.5 MG PO TABS: 0.5000 mg | ORAL_TABLET | Freq: Two times a day (BID) | ORAL | Status: DC | PRN

## 2014-05-30 NOTE — Patient Instructions (Signed)
Will schedule for cardiology.

## 2014-05-30 NOTE — Progress Notes (Signed)
   Subjective:    Patient ID: [name], female    DOB: May 19, 1971, 43 y.o.   MRN: [id number]  HPI    Review of Systems     Objective:   Physical Exam        Assessment & Plan:   Subjective:     [name] is a 43 y.o. woman who comes in today for a  pap smear only. Her most recent annual exam was on 01/26/2014. Her most recent Pap smear was on unknown and showed no abnormalities. Previous abnormal Pap smears: no. Contraception: vasectomy  She continues to have palpitations. She did not start bystolic until about 5 days ago and has noticed improvement since. They still make her very anxious. She is now scared to exercise. When PVC's occur they send her into a panic attack. She does have xanax which she can take to calm her down. No CP, vision changes. She has had a frontal headache a few days after starting bystolic but better today. She feels anxious most days any ways. She worries and very irritable.   Quit smoking 12 days ago cold [place].  The following portions of the patient's history were reviewed and updated as appropriate: allergies, current medications, past family history, past medical history, past social history, past surgical history and problem list.  Review of Systems A comprehensive review of systems was negative.   Objective:    BP 115/81  Pulse 75  Ht 5\' 5"  (1.651 m)  Wt 188 lb (85.276 kg)  BMI 31.28 kg/m2  Heart: NSR, no murmurs. Pysch:  Pelvic Exam: cervix normal in appearance, external genitalia normal and vagina appeared normal with some scant thin white to grey discharge.. Pap smear obtained.   Assessment:    Screening pap smear.   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 Discussed starting celexa. Side effects discussed. Pt is not a fan of medications. Give rx to have filled accordingly. Pt aware to start at bedtime. If anxiety is triggering PVC's could help in more than one way. Continue on bystolic just started 2.5 so give  another week if PVC's still not controlled then increase to 5mg . Reassured pt holter monitor showed tachycardia but no PVC's. Pt request cardiologist for further evaluation since PVC  Are making her very nervous.   Tobacco dependence- stopped smoking 12 days ago. Cold [place]. This could be increasing her anxiety. Encouraged  And praised decision. Encouraged start of celexa.     Follow up in 3 years, or as indicated by Pap results.

## 2014-05-31 DIAGNOSIS — I493 Ventricular premature depolarization: Secondary | ICD-10-CM | POA: Insufficient documentation

## 2014-05-31 LAB — VITAMIN B12: Vitamin B-12: 376 pg/mL (ref 211–911)

## 2014-05-31 LAB — VITAMIN D 25 HYDROXY (VIT D DEFICIENCY, FRACTURES): VIT D 25 HYDROXY: 24 ng/mL — AB (ref 30–89)

## 2014-05-31 LAB — MAGNESIUM: Magnesium: 1.8 mg/dL (ref 1.5–2.5)

## 2014-06-02 LAB — CYTOLOGY - PAP

## 2014-06-02 LAB — CERVICOVAGINAL ANCILLARY ONLY
BACTERIAL VAGINITIS: NEGATIVE
CANDIDA VAGINITIS: NEGATIVE

## 2014-06-03 LAB — CERVICOVAGINAL ANCILLARY ONLY: Herpes: NEGATIVE

## 2014-06-22 ENCOUNTER — Encounter: Payer: Self-pay | Admitting: Cardiology

## 2014-06-22 ENCOUNTER — Encounter: Payer: Self-pay | Admitting: *Deleted

## 2014-06-22 ENCOUNTER — Ambulatory Visit (INDEPENDENT_AMBULATORY_CARE_PROVIDER_SITE_OTHER): Payer: 59 | Admitting: Cardiology

## 2014-06-22 VITALS — BP 142/100 | HR 71 | Ht 65.0 in | Wt 193.0 lb

## 2014-06-22 DIAGNOSIS — R002 Palpitations: Secondary | ICD-10-CM

## 2014-06-22 MED ORDER — NEBIVOLOL HCL 5 MG PO TABS: 5.0000 mg | ORAL_TABLET | Freq: Every day | ORAL | Status: DC

## 2014-06-22 NOTE — Progress Notes (Signed)
     HPI: 43 year old female for evaluation of palpitations. Echocardiogram January 2007 showed normal LV function. Holter monitor in June 2015 showed sinus to sinus tachycardia. Potassium and TSH normal in June 2015. Patient has had intermittent palpitations for years. She has seen cardiologist previously in [place]. Workup has revealed PVCs. Recently her symptoms have worsened. Her palpitations are more sustained. She otherwise does not have dyspnea, chest pain or syncope. She does note increased palpitations with exercise.  Current Outpatient Prescriptions  Medication Sig Dispense Refill  . ALPRAZolam (XANAX) 0.5 MG tablet Take 1 tablet (0.5 mg total) by mouth 2 (two) times daily as needed for anxiety. 60 tablet 1  . citalopram (CELEXA) 20 MG tablet Take one half tablet for 7 days then increase to one full tablet. (Patient taking differently: hasnt started as of 06/22/2014 Take one half tablet for 7 days then increase to one full tablet.) 30 tablet 1  . nebivolol (BYSTOLIC) 2.5 MG tablet Take 2 tablets daily. (Patient taking differently: daily. Take 2.5 mg daily) 60 tablet 2   No current facility-administered medications for this visit.    No Known Allergies   Past Medical History  Diagnosis Date  . Anxiety   . Hypertension   . Hyperlipidemia   . PVC's (premature ventricular contractions)     Past Surgical History  Procedure Laterality Date  . Cholecystectomy      History   Social History  . Marital Status: Married    Spouse Name: N/A    Number of Children: 4  . Years of Education: N/A   Occupational History  .      Office Scientist, water quality   Social History Main Topics  . Smoking status: Former Research scientist (life sciences)  . Smokeless tobacco: Not on file     Comment: quit April 19, 2014  . Alcohol Use: 0.0 oz/week    0 Not specified per week     Comment: Occasional  . Drug Use: No  . Sexual Activity: Yes   Other Topics Concern  . Not on file   Social History Narrative     Family History  Problem Relation Age of Onset  . Alcohol abuse      family hx  . Arthritis      family hx  . Hypertension      family hx  . Stroke      <50 family hx  . Colitis    . Hyperlipidemia Mother   . Hypertension Mother   . Cancer Father   . Alcohol abuse Father     ROS: no fevers or chills, productive cough, hemoptysis, dysphasia, odynophagia, melena, hematochezia, dysuria, hematuria, rash, seizure activity, orthopnea, PND, pedal edema, claudication. Remaining systems are negative.  Physical Exam:   Blood pressure 142/100, pulse 71, height 5\' 5"  (1.651 m), weight 193 lb (87.544 kg), last menstrual period 06/18/2014.  General:  Well developed/well nourished in NAD Skin warm/dry Patient not depressed No peripheral clubbing Back-normal HEENT-normal/normal eyelids Neck supple/normal carotid upstroke bilaterally; no bruits; no JVD; no thyromegaly chest - CTA/ normal expansion CV - RRR/normal S1 and S2; no murmurs, rubs or gallops;  PMI nondisplaced Abdomen -NT/ND, no HSM, no mass, + bowel sounds, no bruit 2+ femoral pulses, no bruits Ext-no edema, chords, 2+ DP Neuro-grossly nonfocal  ECG Sinus rhythm at a rate of 71. Low voltage. No ST changes.

## 2014-06-22 NOTE — Patient Instructions (Addendum)
Your physician recommends that you schedule a follow-up appointment in: [place] has requested that you have an exercise tolerance test. For further information please visit [email or web address]. Please also follow instruction sheet, as given.   Your physician has requested that you have an echocardiogram. Echocardiography is a painless test that uses sound waves to create images of your heart. It provides your doctor with information about the size and shape of your heart and how well your heart's chambers and valves are working. This procedure takes approximately one hour. There are no restrictions for this procedure.   INCREASE BYSTOLIC TO 5 MG ONCE DAILY     Exercise Stress Electrocardiogram An exercise stress electrocardiogram is a test to check how blood flows to your heart. It is done to find areas of poor blood flow. You will need to walk on a treadmill for this test. The electrocardiogram will record your heartbeat when you are at rest and when you are exercising. BEFORE THE PROCEDURE  Do not have drinks with caffeine or foods with caffeine for 24 hours before the test, or as told by your doctor. This includes coffee, tea (even decaf tea), sodas, chocolate, and cocoa.  Follow your doctor's instructions about eating and drinking before the test.  Ask your doctor what medicines you should or should not take before the test. Take your medicines with water unless told by your doctor not to.  If you use an inhaler, bring it with you to the test.  Bring a snack to eat after the test.  Do not  smoke for 4 hours before the test.  Do not put lotions, powders, creams, or oils on your chest before the test.  Wear comfortable shoes and clothing. PROCEDURE  You will have patches put on your chest. Small areas of your chest may need to be shaved. Wires will be connected to the patches.  Your heart rate will be watched while you are resting and while you are  exercising.  You will walk on the treadmill. The treadmill will slowly get faster to raise your heart rate.  The test will take about 1-2 hours. AFTER THE PROCEDURE  Your heart rate and blood pressure will be watched after the test.  You may return to your normal diet, activities, and medicines or as told by your doctor. Document Released: 01/15/2008 Document Revised: 12/13/2013 Document Reviewed: 04/05/2013 [facility] Patient Information 2015 [place], [place]. This information is not intended to replace advice given to you by your health care provider. Make sure you discuss any questions you have with your health care provider.

## 2014-06-22 NOTE — Assessment & Plan Note (Signed)
Blood pressure elevated. Increase bystolic to 5 mg daily.

## 2014-06-22 NOTE — Assessment & Plan Note (Signed)
Patient complains of increasing palpitations. Previously diagnosed with PVCs. She notes increase with exercise. Schedule exercise treadmill. Plan echocardiogram to assess LV function. Increase beta blocker. If symptoms persist we will arrange an event monitor.

## 2014-06-23 ENCOUNTER — Telehealth: Payer: Self-pay | Admitting: Cardiology

## 2014-06-29 ENCOUNTER — Institutional Professional Consult (permissible substitution): Payer: 59 | Admitting: Cardiology

## 2014-07-05 ENCOUNTER — Encounter (HOSPITAL_COMMUNITY): Payer: 59

## 2014-07-05 ENCOUNTER — Ambulatory Visit (HOSPITAL_COMMUNITY)
Admission: RE | Admit: 2014-07-05 | Discharge: 2014-07-05 | Disposition: A | Discharge status: Home / Self Care | Payer: 59 | Source: Ambulatory Visit | Attending: Cardiovascular Disease | Admitting: Cardiovascular Disease

## 2014-07-05 DIAGNOSIS — I10 Essential (primary) hypertension: Secondary | ICD-10-CM | POA: Insufficient documentation

## 2014-07-05 DIAGNOSIS — E785 Hyperlipidemia, unspecified: Secondary | ICD-10-CM | POA: Insufficient documentation

## 2014-07-05 DIAGNOSIS — R002 Palpitations: Secondary | ICD-10-CM | POA: Diagnosis present

## 2014-07-05 DIAGNOSIS — Z87891 Personal history of nicotine dependence: Secondary | ICD-10-CM | POA: Insufficient documentation

## 2014-07-05 DIAGNOSIS — I519 Heart disease, unspecified: Secondary | ICD-10-CM

## 2014-07-05 NOTE — Progress Notes (Signed)
2D Echocardiogram Complete.  07/05/2014   Warden Buffa [place], RDCS

## 2014-07-06 ENCOUNTER — Encounter: Payer: Self-pay | Admitting: Cardiology

## 2014-07-06 NOTE — Telephone Encounter (Signed)
New message     Talk to a nurse regarding her 06-22-14 visit that was posted on mychart

## 2014-07-06 NOTE — Telephone Encounter (Signed)
This encounter was created in error - please disregard.

## 2014-07-22 ENCOUNTER — Telehealth (HOSPITAL_COMMUNITY): Payer: Self-pay

## 2014-07-22 NOTE — Telephone Encounter (Signed)
Encounter complete. 

## 2014-07-27 ENCOUNTER — Ambulatory Visit (HOSPITAL_COMMUNITY)
Admission: RE | Admit: 2014-07-27 | Discharge: 2014-07-27 | Disposition: A | Discharge status: Home / Self Care | Payer: 59 | Source: Ambulatory Visit | Attending: Internal Medicine | Admitting: Internal Medicine

## 2014-07-27 DIAGNOSIS — I10 Essential (primary) hypertension: Secondary | ICD-10-CM

## 2014-07-27 DIAGNOSIS — R002 Palpitations: Secondary | ICD-10-CM | POA: Diagnosis present

## 2014-07-27 DIAGNOSIS — I493 Ventricular premature depolarization: Secondary | ICD-10-CM | POA: Insufficient documentation

## 2014-07-27 DIAGNOSIS — F410 Panic disorder [episodic paroxysmal anxiety] without agoraphobia: Secondary | ICD-10-CM | POA: Diagnosis not present

## 2014-07-27 NOTE — Procedures (Signed)
Exercise Treadmill Test   Test  Exercise Tolerance Test Ordering MD: Kirk Ruths, MD    Unique Test No: 1  Treadmill:  1  Indication for ETT: Palpitations  Contraindication to ETT: No   Stress Modality: exercise - treadmill  Cardiac Imaging Performed: non   Protocol: standard Bruce - maximal  Max BP:  139/104  Max MPHR (bpm):  176 85% MPR (bpm):  149  MPHR obtained (bpm):  179 % MPHR obtained:  102  Reached 85% MPHR (min:sec):  6:26 Total Exercise Time (min-sec):  9  Workload in METS:  10.1 Borg Scale: 15  Reason ETT Terminated:  SOB, Fatigue and panic attack    ST Segment Analysis At Rest: normal ST segments - no evidence of significant ST depression With Exercise: borderline ST changes  Other Information Arrhythmia:  No Angina during ETT:  absent (0) Quality of ETT:  diagnostic  ETT Interpretation:  normal - no evidence of ischemia by ST analysis  Comments: Upsloping <29mm st depression Good exercise tolerance Patient experienced a "panic attack" during the end of exercise PVC's were noted  Pixie Casino, MD, [facility] Attending Cardiologist [place]

## 2014-08-22 ENCOUNTER — Other Ambulatory Visit: Payer: Self-pay | Admitting: Physician Assistant

## 2014-08-24 ENCOUNTER — Encounter: Payer: Self-pay | Admitting: Cardiology

## 2014-08-24 ENCOUNTER — Ambulatory Visit (INDEPENDENT_AMBULATORY_CARE_PROVIDER_SITE_OTHER): Payer: 59 | Admitting: Cardiology

## 2014-08-24 VITALS — BP 140/78 | HR 87 | Ht 65.0 in | Wt 201.0 lb

## 2014-08-24 DIAGNOSIS — I493 Ventricular premature depolarization: Secondary | ICD-10-CM

## 2014-08-24 DIAGNOSIS — R002 Palpitations: Secondary | ICD-10-CM

## 2014-08-24 MED ORDER — NEBIVOLOL HCL 5 MG PO TABS: 5.0000 mg | ORAL_TABLET | Freq: Every day | ORAL | Status: DC

## 2014-08-24 NOTE — Assessment & Plan Note (Addendum)
These are felt secondary to PVCs. Increase beta blocker. We will consider repeating a monitor in the future if her symptoms worsen.

## 2014-08-24 NOTE — Progress Notes (Signed)
      HPI: follow-up palpitations. Holter monitor in June 2015 showed sinus to sinus tachycardia. Potassium and TSH normal in June 2015. Patient has had intermittent palpitations for years. She has seen cardiologist previously in [place]. Workup has revealed PVCs. Echocardiogram November 2015 showed normal LV function and grade 1 diastolic dysfunction. Trace mitral regurgitation. Exercise treadmill December 2015 showed no diagnostic ST changes and PVCs. Since last seen she occasionally feels her heart skip. There is no dyspnea, chest pain or syncope.  Current Outpatient Prescriptions  Medication Sig Dispense Refill  . ALPRAZolam (XANAX) 0.5 MG tablet TAKE 1 TABLET BY MOUTH TWICE A DAY AS NEEDED FOR ANXIETY 30 tablet 0  . nebivolol (BYSTOLIC) 5 MG tablet Take 1 tablet (5 mg total) by mouth daily. (Patient taking differently: Take 2.5 mg by mouth daily. )     No current facility-administered medications for this visit.     Past Medical History  Diagnosis Date  . Anxiety   . Hypertension   . Hyperlipidemia   . PVC's (premature ventricular contractions)     Past Surgical History  Procedure Laterality Date  . Cholecystectomy      History   Social History  . Marital Status: Married    Spouse Name: N/A    Number of Children: 4  . Years of Education: N/A   Occupational History  .      Office Scientist, water quality   Social History Main Topics  . Smoking status: Former Research scientist (life sciences)  . Smokeless tobacco: Not on file     Comment: quit April 19, 2014  . Alcohol Use: 0.0 oz/week    0 Not specified per week     Comment: Occasional  . Drug Use: No  . Sexual Activity: Yes   Other Topics Concern  . Not on file   Social History Narrative    ROS: fatigue, weight gain and hair loss but no fevers or chills, productive cough, hemoptysis, dysphasia, odynophagia, melena, hematochezia, dysuria, hematuria, rash, seizure activity, orthopnea, PND, pedal edema, claudication. Remaining systems  are negative.  Physical Exam: Well-developed well-nourished in no acute distress.  Skin is warm and dry.  HEENT is normal.  Neck is supple.  Chest is clear to auscultation with normal expansion.  Cardiovascular exam is regular rate and rhythm.  Abdominal exam nontender or distended. No masses palpated. Extremities show no edema. neuro grossly intact

## 2014-08-24 NOTE — Assessment & Plan Note (Signed)
Blood pressure mildly elevated. Increase bysystolic to 5 mg daily and follow.

## 2014-08-24 NOTE — Assessment & Plan Note (Signed)
Patient continues to have palpitations. Increase bysystolic to 5 mg daily. I have explained that in the setting of normal LV function preserved benign.

## 2014-08-24 NOTE — Patient Instructions (Signed)
Your physician wants you to follow-up in: [place] will receive a reminder letter in the mail two months in advance. If you don't receive a letter, please call our office to schedule the follow-up appointment.   INCREASE BYSTOLIC TO 5 MG ONCE DAILY

## 2014-08-26 ENCOUNTER — Ambulatory Visit: Payer: 59 | Admitting: Physician Assistant

## 2014-08-26 DIAGNOSIS — Z0289 Encounter for other administrative examinations: Secondary | ICD-10-CM

## 2014-08-30 ENCOUNTER — Encounter: Payer: Self-pay | Admitting: Physician Assistant

## 2014-08-30 ENCOUNTER — Ambulatory Visit (INDEPENDENT_AMBULATORY_CARE_PROVIDER_SITE_OTHER): Payer: 59 | Admitting: Physician Assistant

## 2014-08-30 VITALS — BP 118/77 | HR 77 | Wt 203.0 lb

## 2014-08-30 DIAGNOSIS — R635 Abnormal weight gain: Secondary | ICD-10-CM

## 2014-08-30 DIAGNOSIS — R5383 Other fatigue: Secondary | ICD-10-CM

## 2014-08-30 DIAGNOSIS — L853 Xerosis cutis: Secondary | ICD-10-CM

## 2014-08-30 DIAGNOSIS — L659 Nonscarring hair loss, unspecified: Secondary | ICD-10-CM

## 2014-08-31 ENCOUNTER — Other Ambulatory Visit: Payer: Self-pay | Admitting: Physician Assistant

## 2014-08-31 ENCOUNTER — Other Ambulatory Visit: Payer: Self-pay | Admitting: *Deleted

## 2014-08-31 DIAGNOSIS — R635 Abnormal weight gain: Secondary | ICD-10-CM

## 2014-08-31 LAB — CBC WITH DIFFERENTIAL/PLATELET
BASOS ABS: 0 10*3/uL (ref 0.0–0.1)
Basophils Relative: 0 % (ref 0–1)
EOS PCT: 2 % (ref 0–5)
Eosinophils Absolute: 0.2 10*3/uL (ref 0.0–0.7)
HCT: 41.6 % (ref 36.0–46.0)
Hemoglobin: 14.3 g/dL (ref 12.0–15.0)
Lymphocytes Relative: 30 % (ref 12–46)
Lymphs Abs: 3.3 10*3/uL (ref 0.7–4.0)
MCH: 32.4 pg (ref 26.0–34.0)
MCHC: 34.4 g/dL (ref 30.0–36.0)
MCV: 94.3 fL (ref 78.0–100.0)
MPV: 10.1 fL (ref 8.6–12.4)
Monocytes Absolute: 0.7 10*3/uL (ref 0.1–1.0)
Monocytes Relative: 6 % (ref 3–12)
NEUTROS ABS: 6.9 10*3/uL (ref 1.7–7.7)
Neutrophils Relative %: 62 % (ref 43–77)
PLATELETS: 304 10*3/uL (ref 150–400)
RBC: 4.41 MIL/uL (ref 3.87–5.11)
RDW: 12.7 % (ref 11.5–15.5)
WBC: 11.1 10*3/uL — ABNORMAL HIGH (ref 4.0–10.5)

## 2014-08-31 LAB — TSH: TSH: 3.017 u[IU]/mL (ref 0.350–4.500)

## 2014-08-31 LAB — COMPLETE METABOLIC PANEL WITH GFR
ALBUMIN: 4 g/dL (ref 3.5–5.2)
ALK PHOS: 48 U/L (ref 39–117)
ALT: 26 U/L (ref 0–35)
AST: 21 U/L (ref 0–37)
BUN: 11 mg/dL (ref 6–23)
CHLORIDE: 101 meq/L (ref 96–112)
CO2: 29 mEq/L (ref 19–32)
Calcium: 9.6 mg/dL (ref 8.4–10.5)
Creat: 0.56 mg/dL (ref 0.50–1.10)
GFR, Est African American: >89 mL/min
GFR, Est Non African American: >89 mL/min
GLUCOSE: 80 mg/dL (ref 70–99)
POTASSIUM: 4.3 meq/L (ref 3.5–5.3)
SODIUM: 137 meq/L (ref 135–145)
Total Bilirubin: 0.4 mg/dL (ref 0.2–1.2)
Total Protein: 6.8 g/dL (ref 6.0–8.3)

## 2014-08-31 LAB — T4, FREE: Free T4: 0.95 ng/dL (ref 0.80–1.80)

## 2014-08-31 LAB — VITAMIN B12: Vitamin B-12: 326 pg/mL (ref 211–911)

## 2014-08-31 LAB — VITAMIN D 25 HYDROXY (VIT D DEFICIENCY, FRACTURES): Vit D, 25-Hydroxy: 16 ng/mL — ABNORMAL LOW (ref 30–100)

## 2014-08-31 LAB — FERRITIN: FERRITIN: 148 ng/mL (ref 10–291)

## 2014-08-31 NOTE — Progress Notes (Signed)
   Subjective:    Patient ID: [name], female    DOB: Jul 20, 1971, 44 y.o.   MRN: [id number]  HPI Patient is a 44 year old female who presents to the clinic with some concerns. She is very concerned because she is extremely fatigued. She sleeps 8-9 hours at night but she has no energy the next day. She denies any frequent awakenings or snoring. She has no problems getting to sleep or staying asleep. She is also getting 23 pounds in the last couple months. She's not doing anything different. She admits she not exercising. She is signing of her [employer] today. She has extremely dry skin as well. She is losing hair. She is very concerned her something hormonal going on. She denies any depression or anxiety. She denies any feelings of hopelessness or helplessness. In the past she has had low vitamin D.   Review of Systems  All other systems reviewed and are negative.      Objective:   Physical Exam  Constitutional: She is oriented to person, place, and time. She appears well-developed and well-nourished.  HENT:  Head: Normocephalic and atraumatic.  Neck: Normal range of motion. Neck supple. No thyromegaly present.  Cardiovascular: Normal rate, regular rhythm and normal heart sounds.   Pulmonary/Chest: Effort normal and breath sounds normal. She has no wheezes.  Neurological: She is alert and oriented to person, place, and time.  Psychiatric: She has a normal mood and affect. Her behavior is normal.          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 Imaged by Dr. Stanford Breed, cardiologist.  Fatigue/dry skin/hair loss/weight gain- unclear etiology at this point. GAD-7 was 7, pH Q9 was 8. Patient does not feel like her anxiety or depression is playing a role in the symptoms. She has tried an antidepressant in the past and has made no benefit. Patient reports to be sleeping well at night with no snoring. I have a low suspicion for sleep apnea causing the  symptoms. will order some labs to evaluate vitamin D, B12, iron levels, thyroid and will get a 24-hour urine cortisol to look at any think suspicious for Cushing's. Patient is very concerned and stated if we do not find anything in this initial workup that we could consider referral to endocrinology. Patient is very resistant to treatment with medications therefore we will hold off on any medication treatment at this time.

## 2014-09-05 ENCOUNTER — Other Ambulatory Visit: Payer: Self-pay | Admitting: Physician Assistant

## 2014-09-05 MED ORDER — VITAMIN D (ERGOCALCIFEROL) 1.25 MG (50000 UNIT) PO CAPS: 50000.0000 [IU] | ORAL_CAPSULE | ORAL | Status: DC

## 2014-09-05 MED ORDER — LEVOTHYROXINE SODIUM 25 MCG PO TABS: 25.0000 ug | ORAL_TABLET | Freq: Every day | ORAL | Status: DC

## 2014-09-06 LAB — CORTISOL, URINE, FREE

## 2014-09-13 ENCOUNTER — Other Ambulatory Visit: Payer: Self-pay | Admitting: Physician Assistant

## 2014-09-13 ENCOUNTER — Telehealth: Payer: Self-pay

## 2014-09-13 DIAGNOSIS — E039 Hypothyroidism, unspecified: Secondary | ICD-10-CM

## 2014-09-13 DIAGNOSIS — R1310 Dysphagia, unspecified: Secondary | ICD-10-CM

## 2014-09-13 DIAGNOSIS — R29898 Other symptoms and signs involving the musculoskeletal system: Secondary | ICD-10-CM

## 2014-09-13 NOTE — Telephone Encounter (Signed)
Symptoms also sound like they could be allergic reaction. Stop levothyroxine. Ordered U/S of thyroid and labs. Amber will fax down to lab.

## 2014-09-13 NOTE — Telephone Encounter (Signed)
Spoke with pt to notify her of labs ordered and to stop taking the levothyroxine.  She requested [place] add selenium and iodine labs as well.  Those labs were added & faxed to [facility].

## 2014-09-13 NOTE — Telephone Encounter (Signed)
Patient calls to report worsening of symptoms since starting Levothyroxin 09/05/14; neck feels 'wierd', tenderness right axilla, occasion difficulty swallowing, sharp shooting pains in both heels. She has good insurance which will be D/Cs 10/09/14 due to job change. She would like several tests ordered before end of insurance:for Hashimoto, all related labs; an ultrasound of her thyroid, free T3 and T4 and reverse T3. I suggested this may be time for a referral to Endocrinologist and she is open to this, although would prefer "just having all tests done" with you. Time frame crucial.

## 2014-09-15 ENCOUNTER — Ambulatory Visit (INDEPENDENT_AMBULATORY_CARE_PROVIDER_SITE_OTHER): Payer: 59

## 2014-09-15 DIAGNOSIS — E041 Nontoxic single thyroid nodule: Secondary | ICD-10-CM

## 2014-09-15 DIAGNOSIS — E039 Hypothyroidism, unspecified: Secondary | ICD-10-CM

## 2014-09-15 DIAGNOSIS — R590 Localized enlarged lymph nodes: Secondary | ICD-10-CM

## 2014-09-16 LAB — T3, FREE: T3, Free: 3.2 pg/mL (ref 2.3–4.2)

## 2014-09-16 LAB — THYROID PEROXIDASE ANTIBODY: THYROID PEROXIDASE ANTIBODY: 1 [IU]/mL (ref <9)

## 2014-09-17 LAB — SELENIUM SERUM: Selenium, Blood: 129 mcg/L (ref 63–160)

## 2014-09-17 LAB — IODINE, SERUM/PLASMA: Iodine: 57 mcg/L (ref 52–109)

## 2014-09-19 ENCOUNTER — Other Ambulatory Visit: Payer: Self-pay | Admitting: Physician Assistant

## 2014-09-19 DIAGNOSIS — R5383 Other fatigue: Secondary | ICD-10-CM | POA: Insufficient documentation

## 2014-09-19 DIAGNOSIS — I493 Ventricular premature depolarization: Secondary | ICD-10-CM

## 2014-09-19 DIAGNOSIS — L853 Xerosis cutis: Secondary | ICD-10-CM | POA: Insufficient documentation

## 2014-09-19 DIAGNOSIS — R635 Abnormal weight gain: Secondary | ICD-10-CM | POA: Insufficient documentation

## 2014-09-19 LAB — T3, REVERSE: T3 REVERSE: 20 ng/dL (ref 8–25)

## 2014-10-18 ENCOUNTER — Other Ambulatory Visit: Payer: Self-pay | Admitting: Physician Assistant

## 2014-10-18 NOTE — Telephone Encounter (Signed)
F/u appt needed for anxiety before future refills

## 2014-10-21 ENCOUNTER — Telehealth: Payer: Self-pay | Admitting: *Deleted

## 2014-10-21 ENCOUNTER — Telehealth: Payer: Self-pay | Admitting: Physician Assistant

## 2014-10-21 NOTE — Telephone Encounter (Signed)
Pt left vm stating that the endocrinologist did some labs & the only thing that was "off" was elevated testosterone. They are wanting her to start taking metformin for PCOS.  She states that she does not agree with this due to very regular periods and having 4 children.  She would like a GYN referral.

## 2014-10-22 NOTE — Telephone Encounter (Signed)
Ok to make referral

## 2014-10-25 ENCOUNTER — Telehealth: Payer: Self-pay | Admitting: *Deleted

## 2014-10-25 DIAGNOSIS — R7989 Other specified abnormal findings of blood chemistry: Secondary | ICD-10-CM

## 2014-10-25 NOTE — Telephone Encounter (Signed)
Referral to GYN placed

## 2014-10-31 ENCOUNTER — Encounter: Payer: Self-pay | Admitting: Obstetrics & Gynecology

## 2014-10-31 ENCOUNTER — Ambulatory Visit (INDEPENDENT_AMBULATORY_CARE_PROVIDER_SITE_OTHER): Payer: 59 | Admitting: Obstetrics & Gynecology

## 2014-10-31 VITALS — BP 122/89 | HR 75 | Resp 16 | Ht 65.0 in | Wt 200.0 lb

## 2014-10-31 DIAGNOSIS — R102 Pelvic and perineal pain: Secondary | ICD-10-CM | POA: Diagnosis not present

## 2014-10-31 MED ORDER — LEVONORGEST-ETH ESTRAD 91-DAY 0.15-0.03 &0.01 MG PO TABS: 1.0000 | ORAL_TABLET | Freq: Every day | ORAL | Status: DC

## 2014-10-31 NOTE — Progress Notes (Signed)
   Subjective:    Patient ID: [name], female    DOB: [birth year], 44 y.o.   MRN: [id number]  HPI  44 yo MW P4 (20,18,16, and 60 yo kids) here for evaluation of elevated free test 59 (range 8-48). This was ordered by [facility]. She does mention extra hair growth of chin, legs, butt, started about a year ago. PA at endocrinology said "You have PCOS"  She also complains of bilateral pelvic "cramping", started about 2 months ago, happens "mostly upon waking", lasts 20-40 minutes, goes away spontaneously. Her periods are "like clockwork" every 26 days, lasts 3 days. Her husband has had a vasectomy in 2002. She has never used birth control.  She denies dyspareunia  She has had PVCs since one of her pregnancies in 1998. She remembers them getting worse always just before her periods.  Review of Systems Paternal aunt died of ovarian cancer at 76 yo Mammo and paps UTD  Quit smoking 10/17 and that is when all of her symptoms started.    Objective:   Physical Exam Pleasant non-anxious WF NAD Breathing and ambulating normally      Assessment & Plan:  Pelvic pain- check gyn u/s

## 2014-11-01 ENCOUNTER — Telehealth: Payer: Self-pay | Admitting: Physician Assistant

## 2014-11-01 ENCOUNTER — Ambulatory Visit (INDEPENDENT_AMBULATORY_CARE_PROVIDER_SITE_OTHER): Payer: 59

## 2014-11-01 DIAGNOSIS — R102 Pelvic and perineal pain: Secondary | ICD-10-CM

## 2014-11-01 NOTE — Telephone Encounter (Signed)
I see [name] has seen Dr. Hulan Fray she mentioned drawing a lymes titer with EBV. We can send to lab if [name] would like to have drawn. Will you call and ask [name]?

## 2014-11-03 ENCOUNTER — Ambulatory Visit (INDEPENDENT_AMBULATORY_CARE_PROVIDER_SITE_OTHER): Payer: 59 | Admitting: Obstetrics & Gynecology

## 2014-11-03 ENCOUNTER — Encounter: Payer: Self-pay | Admitting: Obstetrics & Gynecology

## 2014-11-03 VITALS — BP 131/86 | HR 78 | Resp 16 | Ht 65.0 in | Wt 200.0 lb

## 2014-11-03 DIAGNOSIS — R5382 Chronic fatigue, unspecified: Secondary | ICD-10-CM | POA: Diagnosis not present

## 2014-11-03 MED ORDER — LEVOTHYROXINE SODIUM 25 MCG PO TABS: 25.0000 ug | ORAL_TABLET | Freq: Every day | ORAL | Status: DC

## 2014-11-03 NOTE — Progress Notes (Signed)
   Subjective:    Patient ID: [name], female    DOB: Aug 20, 1970, 44 y.o.   MRN: [id number]  HPI  44 yo lady here with fatigue and for follow up for her u/s.  Review of Systems     Objective:   Physical Exam   U/s normal     Assessment & Plan:  Fatigue- She would like to retry synthroid. OK. Re check thyroid panel in 3 month

## 2014-11-21 ENCOUNTER — Other Ambulatory Visit: Payer: Self-pay | Admitting: Physician Assistant

## 2014-12-12 ENCOUNTER — Other Ambulatory Visit: Payer: Self-pay | Admitting: Physician Assistant

## 2014-12-15 ENCOUNTER — Telehealth: Payer: Self-pay | Admitting: Cardiology

## 2014-12-15 NOTE — Telephone Encounter (Signed)
Pt. Encouraged to make appt. With Dr. Stanford Breed or see an extender on  A day Dr. Stanford Breed is here. Pt. stated understanding of instructions

## 2014-12-15 NOTE — Telephone Encounter (Signed)
Patient c/o Palpitations:  High priority if patient c/o lightheadedness and shortness of breath.  1. How long have you been having palpitations? yesterday  2. Are you currently experiencing lightheadedness and shortness of breath?No  3. Have you checked your BP and heart rate? (document readings) 138/86 pulse 74  4. Are you experiencing any other symptoms? No

## 2014-12-19 ENCOUNTER — Ambulatory Visit: Payer: 59 | Admitting: Cardiology

## 2015-01-17 ENCOUNTER — Other Ambulatory Visit: Payer: Self-pay | Admitting: Physician Assistant

## 2015-01-19 ENCOUNTER — Other Ambulatory Visit: Payer: Self-pay

## 2015-01-19 MED ORDER — CITALOPRAM HYDROBROMIDE 20 MG PO TABS: 20.0000 mg | ORAL_TABLET | Freq: Every day | ORAL | Status: DC

## 2015-01-19 MED ORDER — ALPRAZOLAM 0.5 MG PO TABS: 0.5000 mg | ORAL_TABLET | Freq: Two times a day (BID) | ORAL | Status: DC | PRN

## 2015-01-20 ENCOUNTER — Other Ambulatory Visit: Payer: Self-pay

## 2015-01-20 MED ORDER — SERTRALINE HCL 50 MG PO TABS: 50.0000 mg | ORAL_TABLET | Freq: Every day | ORAL | Status: DC

## 2015-02-03 ENCOUNTER — Ambulatory Visit: Payer: 59 | Admitting: Cardiology

## 2015-02-20 ENCOUNTER — Encounter: Payer: Self-pay | Admitting: Family Medicine

## 2015-02-20 ENCOUNTER — Other Ambulatory Visit: Payer: 59

## 2015-02-20 ENCOUNTER — Ambulatory Visit (INDEPENDENT_AMBULATORY_CARE_PROVIDER_SITE_OTHER): Payer: 59 | Admitting: Family Medicine

## 2015-02-20 VITALS — BP 143/92 | HR 94 | Wt 209.0 lb

## 2015-02-20 DIAGNOSIS — R5382 Chronic fatigue, unspecified: Secondary | ICD-10-CM

## 2015-02-20 DIAGNOSIS — I493 Ventricular premature depolarization: Secondary | ICD-10-CM | POA: Diagnosis not present

## 2015-02-20 DIAGNOSIS — G4719 Other hypersomnia: Secondary | ICD-10-CM | POA: Diagnosis not present

## 2015-02-20 DIAGNOSIS — F411 Generalized anxiety disorder: Secondary | ICD-10-CM | POA: Diagnosis not present

## 2015-02-20 DIAGNOSIS — G478 Other sleep disorders: Secondary | ICD-10-CM

## 2015-02-20 MED ORDER — ALPRAZOLAM 0.5 MG PO TABS: 0.5000 mg | ORAL_TABLET | Freq: Two times a day (BID) | ORAL | Status: DC | PRN

## 2015-02-20 MED ORDER — SERTRALINE HCL 50 MG PO TABS: 25.0000 mg | ORAL_TABLET | Freq: Every day | ORAL | Status: DC

## 2015-02-20 NOTE — Addendum Note (Signed)
Addended by: Asencion Islam on: 02/20/2015 04:41 PM   Modules accepted: Orders

## 2015-02-20 NOTE — Addendum Note (Signed)
Addended by: Phill Myron on: 02/20/2015 04:43 PM   Modules accepted: Orders

## 2015-02-20 NOTE — Progress Notes (Signed)
CC: [name] is a 44 y.o. female is here for Fatigue and Palpitations   Subjective: HPI:  Follow-up anxiety: Vision states that the only thing that causes her anxiety is palpitations. She tells me it always occurs for 2-3 days in the direct middle of her ovilary cycle and also during her menses. Symptoms are moderate to severe in severity but have slightly improved with using Bystolic, sertraline and as needed alprazolam. She is requesting refills on Zoloft and Xanax. She's tried metoprolol the past for palpitations however it was intolerable due to hypotension. She denies any chest pain, lightheadedness, nor peripheral edema.   She continues to struggle with severe daytime sleepiness and nonrestorative sleep. She tells me that she slept 13 hours yesterday and would not have woken up with her husband and wake her up. This is a typical scenario has been going on for at least 9 months now. Symptoms were present prior to starting a beta blocker. Symptoms have been persistent despite starting on a low dose of levothyroxine given to her by her gynecologist. Symptoms are severe in severity. Nothing seems to make symptoms better or worse. She's had quite a bit of blood work done over the last 9 months all of which was unremarkable other than vitamin D however she is taking vitamin D supplement on a daily basis given to her by her endocrinologist and has not noticed any benefit. Denies fevers, chills, joint pain or rash.  Review Of Systems Outlined In HPI  Past Medical History  Diagnosis Date  . Anxiety   . Hypertension   . Hyperlipidemia   . PVC's (premature ventricular contractions)     Past Surgical History  Procedure Laterality Date  . Cholecystectomy     Family History  Problem Relation Age of Onset  . Alcohol abuse      family hx  . Arthritis      family hx  . Hypertension      family hx  . Stroke      <50 family hx  . Colitis    . Hyperlipidemia Mother   . Hypertension  Mother   . Cancer Father   . Alcohol abuse Father     History   Social History  . Marital Status: Married    Spouse Name: N/A  . Number of Children: 4  . Years of Education: N/A   Occupational History  .      Office Scientist, water quality   Social History Main Topics  . Smoking status: Former Research scientist (life sciences)  . Smokeless tobacco: Not on file     Comment: quit April 19, 2014  . Alcohol Use: 0.0 oz/week    0 Standard drinks or equivalent per week     Comment: Occasional  . Drug Use: No  . Sexual Activity: Yes    Birth Control/ Protection: Other-see comments     Comment: Spouse vasectomy   Other Topics Concern  . Not on file   Social History Narrative     Objective: BP 143/92 mmHg  Pulse 94  Wt 209 lb (94.802 kg)  Vital signs reviewed. General: Alert and Oriented, No Acute Distress HEENT: Pupils equal, round, reactive to light. Conjunctivae clear.  External ears unremarkable.  Moist mucous membranes. Lungs: Clear and comfortable work of breathing, speaking in full sentences without accessory muscle use. Cardiac: Regular rate and rhythm.  Neuro: CN II-XII grossly intact, gait normal. Extremities: No peripheral edema.  Strong peripheral pulses.  Mental Status: No depression, anxiety, nor agitation.  Logical though process. Skin: Warm and dry.  Assessment & Plan: [name] was seen today for fatigue and palpitations.  Diagnoses and all orders for this visit:  Non-restorative sleep Orders: -     Home sleep test  Excessive daytime sleepiness Orders: -     Home sleep test  PVC's (premature ventricular contractions)  Anxiety state  Other orders -     sertraline (ZOLOFT) 50 MG tablet; Take 0.5 tablets (25 mg total) by mouth daily. -     ALPRAZolam (XANAX) 0.5 MG tablet; Take 1 tablet (0.5 mg total) by mouth 2 (two) times daily as needed for anxiety.   Suspicion for sleep apnea or some other sleep disorder preventing her from getting REM sleep is high therefore start  home sleep study. PVCs and anxiety: Offered a switch to carvedilol which would be much more affordable with her having to pay out of pocket for Bystolic however she states that she is willing to live with the symptoms at the current severity level and is overall pleased with the Bystolic, Zoloft and alprazolam regimen.   Return if symptoms worsen or fail to improve.

## 2015-02-21 ENCOUNTER — Telehealth: Payer: Self-pay | Admitting: *Deleted

## 2015-02-21 LAB — THYROID PANEL WITH TSH
FREE THYROXINE INDEX: 2 (ref 1.4–3.8)
T3 UPTAKE: 25 % (ref 22–35)
T4 TOTAL: 8 ug/dL (ref 4.5–12.0)
TSH: 2.144 u[IU]/mL (ref 0.350–4.500)

## 2015-02-21 NOTE — Telephone Encounter (Signed)
Copy of normal labs mailed to pt's home address.

## 2015-03-13 ENCOUNTER — Ambulatory Visit (HOSPITAL_BASED_OUTPATIENT_CLINIC_OR_DEPARTMENT_OTHER): Payer: Medicaid Other | Attending: Family Medicine | Admitting: Radiology

## 2015-03-13 VITALS — Ht 65.0 in | Wt 195.0 lb

## 2015-03-13 DIAGNOSIS — G4719 Other hypersomnia: Secondary | ICD-10-CM

## 2015-03-13 DIAGNOSIS — G4733 Obstructive sleep apnea (adult) (pediatric): Secondary | ICD-10-CM | POA: Insufficient documentation

## 2015-03-13 DIAGNOSIS — G4710 Hypersomnia, unspecified: Secondary | ICD-10-CM | POA: Diagnosis present

## 2015-03-23 ENCOUNTER — Other Ambulatory Visit: Payer: Self-pay | Admitting: *Deleted

## 2015-03-23 DIAGNOSIS — R002 Palpitations: Secondary | ICD-10-CM

## 2015-03-23 MED ORDER — NEBIVOLOL HCL 5 MG PO TABS: 5.0000 mg | ORAL_TABLET | Freq: Every day | ORAL | Status: DC

## 2015-03-27 ENCOUNTER — Encounter: Payer: Self-pay | Admitting: Obstetrics & Gynecology

## 2015-03-27 ENCOUNTER — Ambulatory Visit (INDEPENDENT_AMBULATORY_CARE_PROVIDER_SITE_OTHER): Payer: Medicaid Other | Admitting: Obstetrics & Gynecology

## 2015-03-27 VITALS — BP 128/92 | HR 87 | Resp 16 | Ht 65.0 in | Wt 210.0 lb

## 2015-03-27 DIAGNOSIS — R5382 Chronic fatigue, unspecified: Secondary | ICD-10-CM

## 2015-03-27 NOTE — Progress Notes (Signed)
   Subjective:    Patient ID: [name], female    DOB: [birth year], 44 y.o.   MRN: [id number]  HPI  Ms. Blane is here to discuss some salivary hormone test results she received in the mail. Her test and DHEAS were elevated and the prog and estradiol were normal. She is seeing Telford Nab and also an endocrinologist for her fatigue.  Review of Systems She has monthly periods about every 27-30 days.    Objective:   Physical Exam [id number] Breathing, conversing, and ambulating normally       Assessment & Plan:  Chronic fatigue- I would defer to her endocrinologist and primary care provider

## 2015-03-28 ENCOUNTER — Telehealth: Payer: Self-pay | Admitting: Family Medicine

## 2015-03-28 NOTE — Telephone Encounter (Signed)
Pt called clinic regarding results from her sleep study. Will route to Provider who ordered exam and Providers who are covering for PCP in her absence.

## 2015-03-28 NOTE — Telephone Encounter (Signed)
Kelsi, I can't find results anywhere in our system regarding this.  Can you see if any of the [facility] sleep labs have records of this.  Also I don't think I'm covering for jade this month.

## 2015-03-28 NOTE — Telephone Encounter (Signed)
Called sleep study location in [place], Pt study has not been reviewed yet. Advised Pt once it's reviewed we would contact her.

## 2015-03-29 ENCOUNTER — Ambulatory Visit (INDEPENDENT_AMBULATORY_CARE_PROVIDER_SITE_OTHER): Payer: Medicaid Other | Admitting: Cardiology

## 2015-03-29 ENCOUNTER — Encounter: Payer: Self-pay | Admitting: Cardiology

## 2015-03-29 VITALS — BP 118/70 | HR 78 | Ht 65.0 in | Wt 211.1 lb

## 2015-03-29 DIAGNOSIS — R002 Palpitations: Secondary | ICD-10-CM

## 2015-03-29 DIAGNOSIS — E785 Hyperlipidemia, unspecified: Secondary | ICD-10-CM

## 2015-03-29 DIAGNOSIS — I10 Essential (primary) hypertension: Secondary | ICD-10-CM

## 2015-03-29 MED ORDER — ATENOLOL 25 MG PO TABS: 25.0000 mg | ORAL_TABLET | Freq: Two times a day (BID) | ORAL | Status: DC

## 2015-03-29 NOTE — Progress Notes (Signed)
      HPI: follow-up palpitations. Holter monitor in June 2015 showed sinus to sinus tachycardia. Potassium and TSH normal in June 2015. Patient has had intermittent palpitations for years. She has seen cardiologist previously in [place]. Workup has revealed PVCs. Echocardiogram November 2015 showed normal LV function and grade 1 diastolic dysfunction. Trace mitral regurgitation. Exercise treadmill December 2015 showed no diagnostic ST changes and PVCs. Since last seen she states her palpitations have worsened recently. They are continued to be described as a skip and heartbeat. Not sustained. She notes some dyspnea on exertion but no orthopnea, PND, pedal edema or chest pain.  Current Outpatient Prescriptions  Medication Sig Dispense Refill  . ALPRAZolam (XANAX) 0.5 MG tablet Take 1 tablet (0.5 mg total) by mouth 2 (two) times daily as needed for anxiety. 45 tablet 1  . Cholecalciferol (VITAMIN D3) 5000 UNITS CAPS Take 1 capsule by mouth daily.    [name] levothyroxine (LEVOTHROID) 25 MCG tablet Take 1 tablet (25 mcg total) by mouth daily before breakfast. 90 tablet 6  . metFORMIN (GLUCOPHAGE) 500 MG tablet Take 500 mg by mouth 2 (two) times daily with a meal.     . atenolol (TENORMIN) 25 MG tablet Take 1 tablet (25 mg total) by mouth 2 (two) times daily. 180 tablet 3   No current facility-administered medications for this visit.     Past Medical History  Diagnosis Date  . Anxiety   . Hypertension   . Hyperlipidemia   . PVC's (premature ventricular contractions)     Past Surgical History  Procedure Laterality Date  . Cholecystectomy      Social History   Social History  . Marital Status: Married    Spouse Name: N/A  . Number of Children: 4  . Years of Education: N/A   Occupational History  .      Office Scientist, water quality   Social History Main Topics  . Smoking status: Former Research scientist (life sciences)  . Smokeless tobacco: Not on file     Comment: quit April 19, 2014  . Alcohol Use: 0.0  oz/week    0 Standard drinks or equivalent per week     Comment: Occasional  . Drug Use: No  . Sexual Activity: Yes    Birth Control/ Protection: Other-see comments     Comment: Spouse vasectomy   Other Topics Concern  . Not on file   Social History Narrative    ROS: no fevers or chills, productive cough, hemoptysis, dysphasia, odynophagia, melena, hematochezia, dysuria, hematuria, rash, seizure activity, orthopnea, PND, pedal edema, claudication. Remaining systems are negative.  Physical Exam: Well-developed well-nourished in no acute distress.  Skin is warm and dry.  HEENT is normal.  Neck is supple.  Chest is clear to auscultation with normal expansion.  Cardiovascular exam is regular rate and rhythm.  Abdominal exam nontender or distended. No masses palpated. Extremities show no edema. neuro grossly intact  ECG sinus rhythm at a rate of 78. No ST changes.

## 2015-03-29 NOTE — Patient Instructions (Signed)
Your physician recommends that you schedule a follow-up appointment in: 8 WEEKS WITH DR CRENSHAW  HOLD BYSTOLIC  START ATENOLOL 25 MG ONCE DAILY CAN INCREASE TO 256 MG TWICE DAILY  IF BP IS ABOVE 100

## 2015-03-29 NOTE — Assessment & Plan Note (Signed)
Blood pressure controlled. Continue beta blocker.

## 2015-03-29 NOTE — Assessment & Plan Note (Signed)
Patient continues to have palpitations likely secondary to PVCs. See previous workup above. She feels by systolic is not controlling. She will hold this medication and I will try atenolol 25 mg by mouth twice a day. We can consider repeating her monitor in the future if needed.

## 2015-03-29 NOTE — Assessment & Plan Note (Signed)
Management per primary care. 

## 2015-04-01 ENCOUNTER — Encounter (HOSPITAL_BASED_OUTPATIENT_CLINIC_OR_DEPARTMENT_OTHER): Payer: Medicaid Other | Admitting: Internal Medicine

## 2015-04-01 DIAGNOSIS — G4719 Other hypersomnia: Secondary | ICD-10-CM | POA: Diagnosis not present

## 2015-04-01 NOTE — Progress Notes (Addendum)
   Patient Name: [name], Kerkman Date: 03/13/2015 Gender: Female D.O.B: 1971/01/18 Age (years): 43 Referring Provider: Ileene Rubens Height (inches): 65 Interpreting Physician: Baird Lyons MD, ABSM Weight (lbs): 200 RPSGT: Jacolyn Reedy BMI: 33 MRN: [id number] Neck Size: CLINICAL INFORMATION Sleep Study Type: Home Sleep Test- unattended     Indication for sleep study: 780.54 Hypersomnia     Epworth Sleepiness Score: 2/24  SLEEP STUDY TECHNIQUE A multi-channel overnight portable sleep study was performed. The channels recorded were: nasal airflow, thoracic respiratory movement, and oxygen saturation with a pulse oximetry. Snoring was also monitored.  MEDICATIONS Patient self administered medications include: N/A.  SLEEP ARCHITECTURE Patient was studied for 441.8 minutes. The sleep efficiency was 92.3 % and the patient was supine for 47.1%. The arousal index was 0.0 per hour.  RESPIRATORY PARAMETERS The overall AHI was 11.5 per hour, with a central apnea index of 0.1 per hour.  The oxygen nadir was 87% during sleep, with 2.2 minutes with oxygen saturation <= 89% on room air.   CARDIAC DATA Mean heart rate during sleep was 83.1 bpm.  IMPRESSIONS Mild obstructive sleep apnea occurred during this study (AHI = 11.5/h). No significant central sleep apnea occurred during this study (CAI = 0.1/h). Mild oxygen desaturation was noted during this study (Min O2 = 87%).   DIAGNOSIS Obstructive Sleep Apnea (327.23 [G47.33 ICD-10])  RECOMMENDATIONS Therapeutic CPAP titration is an option Positional therapy avoiding supine position during sleep. Oral appliance may be considered. Avoid alcohol, sedatives and other CNS depressants that may worsen sleep apnea and disrupt normal sleep architecture. Sleep hygiene should be reviewed to assess factors that may improve sleep quality. Weight management and regular exercise should be initiated or continued.    Deneise Lever Diplomate, American Board of Sleep Medicine  ELECTRONICALLY SIGNED ON:  04/01/2015, 11:46 AM [place] PH: [phone]   FX: [phone] [place]

## 2015-04-03 ENCOUNTER — Other Ambulatory Visit: Payer: Self-pay

## 2015-04-03 ENCOUNTER — Telehealth: Payer: Self-pay | Admitting: Family Medicine

## 2015-04-03 DIAGNOSIS — G4733 Obstructive sleep apnea (adult) (pediatric): Secondary | ICD-10-CM | POA: Insufficient documentation

## 2015-04-03 NOTE — Telephone Encounter (Signed)
Pt.notified

## 2015-04-03 NOTE — Telephone Encounter (Signed)
Seth Bake, Will you please let patient know that her sleep test confirmed a mild case of sleep apnea.  I would recommend going through with a CPAP titration study to see if this helps her feel better. An order has been placed to have this done at [facility].

## 2015-04-06 ENCOUNTER — Telehealth: Payer: Self-pay

## 2015-04-12 ENCOUNTER — Other Ambulatory Visit: Payer: Self-pay

## 2015-04-12 ENCOUNTER — Telehealth: Payer: Self-pay

## 2015-04-12 MED ORDER — BYSTOLIC 5 MG PO TABS: 5.0000 mg | ORAL_TABLET | Freq: Every day | ORAL | Status: DC

## 2015-04-12 NOTE — Telephone Encounter (Signed)
Left message for patient to call back Thursday 04/13/15. PA sent to [facility].

## 2015-04-12 NOTE — Telephone Encounter (Signed)
Resume bystolic 5 mg daily; dc atenolol Kirk Ruths

## 2015-04-12 NOTE — Telephone Encounter (Signed)
Phone call today. Patient requesting to resume Bystolic 5mg  daily. She states the Atenolol 25mg  bid made her feel fatigued and dropped her pulse into the 40's Felt better on the Bystolic. I will get a PA for it, but won't notify the pharmacy till you approve it.

## 2015-04-21 ENCOUNTER — Encounter (HOSPITAL_BASED_OUTPATIENT_CLINIC_OR_DEPARTMENT_OTHER): Payer: Medicaid Other

## 2015-04-28 ENCOUNTER — Ambulatory Visit: Payer: Medicaid Other | Admitting: Physician Assistant

## 2015-05-01 ENCOUNTER — Ambulatory Visit (INDEPENDENT_AMBULATORY_CARE_PROVIDER_SITE_OTHER): Payer: Medicaid Other | Admitting: Physician Assistant

## 2015-05-01 ENCOUNTER — Encounter: Payer: Self-pay | Admitting: Physician Assistant

## 2015-05-01 VITALS — BP 114/77 | HR 84 | Temp 98.6°F | Wt 210.0 lb

## 2015-05-01 DIAGNOSIS — E559 Vitamin D deficiency, unspecified: Secondary | ICD-10-CM

## 2015-05-01 DIAGNOSIS — F411 Generalized anxiety disorder: Secondary | ICD-10-CM

## 2015-05-01 DIAGNOSIS — E8881 Metabolic syndrome: Secondary | ICD-10-CM | POA: Diagnosis not present

## 2015-05-01 DIAGNOSIS — E038 Other specified hypothyroidism: Secondary | ICD-10-CM | POA: Diagnosis not present

## 2015-05-01 DIAGNOSIS — R002 Palpitations: Secondary | ICD-10-CM

## 2015-05-01 DIAGNOSIS — E88819 Insulin resistance, unspecified: Secondary | ICD-10-CM

## 2015-05-01 DIAGNOSIS — R5383 Other fatigue: Secondary | ICD-10-CM

## 2015-05-01 MED ORDER — BUSPIRONE HCL 7.5 MG PO TABS: 7.5000 mg | ORAL_TABLET | Freq: Two times a day (BID) | ORAL | Status: DC

## 2015-05-01 MED ORDER — PROPRANOLOL HCL 10 MG PO TABS: 10.0000 mg | ORAL_TABLET | Freq: Three times a day (TID) | ORAL | Status: DC

## 2015-05-01 MED ORDER — SERTRALINE HCL 50 MG PO TABS
50.0000 mg | ORAL_TABLET | Freq: Every day | ORAL | Status: DC
Start: 2015-05-01 — End: 2015-07-22

## 2015-05-01 NOTE — Patient Instructions (Signed)
flonase 2 sprays each nostril daily

## 2015-05-01 NOTE — Progress Notes (Addendum)
   Subjective:    Patient ID: [name], female    DOB: [birth year], 44 y.o.   MRN: [id number]  HPI Patient presents to clinic today with increased palpitations, fatigue, anxiety, weight gain, irregular menses, menorrhagia, breast tenderness, and otalgia. She reports she experiences extreme fatigue daily, and feels "like she is dying or there is something seriously wrong". She has been worked up by obgyn, endocrinology, and cardiology for fatigue with no established etiology. She states she is experiencing PVCs and skipped beats daily, particularly when participating in physical activity. Cardiac symptoms result in panic attacks, which has caused her to decrease physical activity over the past few months. She wonders if decrease in activity has contributed to weight gain. She states she would like to try propranolol instead of bystolic because her mother experienced relief when she had similar cardiac symptoms. LMP start date was 9/17, but she has previously gone over 60 days without menses. She wonders if this indicates she is in peri-menopause, or that she has "hormone imbalance responsible for all of her symptoms". She states she is having menorrhagia with current menses and is bleeding through many heavy pads per day. She is also experiencing increased breast tenderness. She denies the possibility of pregnancy because her husband had a vasectomy several years ago. She recently had a saliva hormone study performed by a "functional doctor" in [place], and would like the results to be interpreted. She also believes her thyroid function needs to be evaluated (last TSH performed in July), as well as iron studies and "hormone levels". She will not be seen by her endocrinologist until January. She also complains of increased otorrhea, particularly in the morning.  .. Past Medical History  Diagnosis Date  . Anxiety   . Hypertension   . Hyperlipidemia   . PVC's (premature ventricular contractions)       Review of Systems Positive for symptoms listed in HPI    Objective:   Physical Exam  Constitutional: She is oriented to person, place, and time. She appears well-developed and well-nourished.  HENT:  Head: Normocephalic and atraumatic.  Neurological: She is alert and oriented to person, place, and time.  Psychiatric:  Anxious today.     Ears: External canal clear, TM visualized without bulging or erythema bilaterally  Cardiac: Normal S1, S2. No M/G/R Pulm: CTAB       Assessment & Plan:  PVCs & Palpitations- Discontinue bystolic and begin 10 mg propranolol tablet tid. This was done on pts request. Her mother tolerated this medication and had same symptoms.   Anxiety- Rx 50 mg sertraline tablet qd and 7.5 mg buspirone tablet bid. Continue .5 mg alprazolam tablet prn. I feel like her ongoing anxiety could be causing PVC's to worsen.   Fatigue- Check thyroid, vitamin D, b12 and iron panel, follow up in 4 weeks  Hypothyroidism- seen at [facility]. Last TSH panel was normal 2 months ago. Pt request to recheck. On BRAND synthyroid daily.   Amenorrhea/menorrhagia- Consult Dr. Leona Singleton at [facility] regarding "functional doctor" hormone testing results for guidance on hormone treatment.    Otalgia- Likely allergic etiology, recommend intranasal steroid to help with some eustachian tube dysfunction.  Reviewed and changes made by Iran Planas PA-C

## 2015-05-02 ENCOUNTER — Encounter: Payer: Self-pay | Admitting: Physician Assistant

## 2015-05-02 LAB — CBC WITH DIFFERENTIAL/PLATELET
BASOS ABS: 0 10*3/uL (ref 0.0–0.1)
BASOS PCT: 0 % (ref 0–1)
Eosinophils Absolute: 0.2 10*3/uL (ref 0.0–0.7)
Eosinophils Relative: 2 % (ref 0–5)
HCT: 39.1 % (ref 36.0–46.0)
Hemoglobin: 13 g/dL (ref 12.0–15.0)
LYMPHS PCT: 30 % (ref 12–46)
Lymphs Abs: 2.8 10*3/uL (ref 0.7–4.0)
MCH: 32 pg (ref 26.0–34.0)
MCHC: 33.2 g/dL (ref 30.0–36.0)
MCV: 96.3 fL (ref 78.0–100.0)
MONO ABS: 0.5 10*3/uL (ref 0.1–1.0)
MPV: 9.7 fL (ref 8.6–12.4)
Monocytes Relative: 5 % (ref 3–12)
NEUTROS ABS: 5.8 10*3/uL (ref 1.7–7.7)
NEUTROS PCT: 63 % (ref 43–77)
Platelets: 331 10*3/uL (ref 150–400)
RBC: 4.06 MIL/uL (ref 3.87–5.11)
RDW: 13.4 % (ref 11.5–15.5)
WBC: 9.2 10*3/uL (ref 4.0–10.5)

## 2015-05-02 LAB — VITAMIN D 25 HYDROXY (VIT D DEFICIENCY, FRACTURES): VIT D 25 HYDROXY: 29 ng/mL — AB (ref 30–100)

## 2015-05-02 LAB — THYROID PANEL WITH TSH
Free Thyroxine Index: 2.4 (ref 1.4–3.8)
T3 UPTAKE: 23 % (ref 22–35)
T4, Total: 10.4 ug/dL (ref 4.5–12.0)
TSH: 2.275 u[IU]/mL (ref 0.350–4.500)

## 2015-05-02 LAB — IRON AND TIBC
%SAT: 24 % (ref 11–50)
IRON: 91 ug/dL (ref 40–190)
TIBC: 376 ug/dL (ref 250–450)
UIBC: 285 ug/dL (ref 125–400)

## 2015-05-02 LAB — HEMOGLOBIN A1C
HEMOGLOBIN A1C: 5.5 % (ref <5.7)
Mean Plasma Glucose: 111 mg/dL (ref <117)

## 2015-05-02 LAB — FERRITIN: FERRITIN: 160 ng/mL (ref 10–291)

## 2015-05-02 NOTE — Addendum Note (Signed)
Addended by: Donella Stade on: 05/02/2015 08:48 AM   Modules accepted: Level of Service

## 2015-05-10 ENCOUNTER — Other Ambulatory Visit: Payer: Self-pay | Admitting: *Deleted

## 2015-05-10 MED ORDER — AMBULATORY NON FORMULARY MEDICATION: Status: DC

## 2015-05-24 ENCOUNTER — Ambulatory Visit: Payer: Medicaid Other | Admitting: Cardiology

## 2015-05-24 NOTE — Telephone Encounter (Signed)
Was this done?

## 2015-05-27 ENCOUNTER — Other Ambulatory Visit: Payer: Self-pay | Admitting: Family Medicine

## 2015-05-29 ENCOUNTER — Ambulatory Visit: Payer: Medicaid Other | Admitting: Physician Assistant

## 2015-05-31 ENCOUNTER — Ambulatory Visit: Payer: Medicaid Other | Admitting: Physician Assistant

## 2015-06-23 ENCOUNTER — Other Ambulatory Visit: Payer: Self-pay | Admitting: Physician Assistant

## 2015-06-24 ENCOUNTER — Other Ambulatory Visit: Payer: Self-pay | Admitting: Physician Assistant

## 2015-06-27 ENCOUNTER — Encounter: Payer: Self-pay | Admitting: Physician Assistant

## 2015-06-27 ENCOUNTER — Ambulatory Visit (INDEPENDENT_AMBULATORY_CARE_PROVIDER_SITE_OTHER): Payer: Medicaid Other | Admitting: Physician Assistant

## 2015-06-27 VITALS — BP 133/87 | HR 88 | Ht 65.0 in | Wt 214.0 lb

## 2015-06-27 DIAGNOSIS — F411 Generalized anxiety disorder: Secondary | ICD-10-CM

## 2015-06-27 DIAGNOSIS — I493 Ventricular premature depolarization: Secondary | ICD-10-CM | POA: Diagnosis not present

## 2015-06-27 DIAGNOSIS — R002 Palpitations: Secondary | ICD-10-CM

## 2015-06-27 DIAGNOSIS — N938 Other specified abnormal uterine and vaginal bleeding: Secondary | ICD-10-CM | POA: Diagnosis not present

## 2015-06-27 LAB — CBC
HCT: 34.3 % — ABNORMAL LOW (ref 36.0–46.0)
HEMOGLOBIN: 11.5 g/dL — AB (ref 12.0–15.0)
MCH: 31.9 pg (ref 26.0–34.0)
MCHC: 33.5 g/dL (ref 30.0–36.0)
MCV: 95 fL (ref 78.0–100.0)
MPV: 9.8 fL (ref 8.6–12.4)
PLATELETS: 383 10*3/uL (ref 150–400)
RBC: 3.61 MIL/uL — AB (ref 3.87–5.11)
RDW: 13.1 % (ref 11.5–15.5)
WBC: 8.7 10*3/uL (ref 4.0–10.5)

## 2015-06-27 MED ORDER — PROPRANOLOL HCL 10 MG PO TABS: ORAL_TABLET | ORAL | Status: DC

## 2015-06-27 MED ORDER — ALPRAZOLAM 0.5 MG PO TABS: ORAL_TABLET | ORAL | Status: DC

## 2015-06-27 MED ORDER — BUSPIRONE HCL 7.5 MG PO TABS: ORAL_TABLET | ORAL | Status: DC

## 2015-06-27 MED ORDER — MEDROXYPROGESTERONE ACETATE 10 MG PO TABS: 10.0000 mg | ORAL_TABLET | Freq: Every day | ORAL | Status: DC

## 2015-06-27 NOTE — Telephone Encounter (Signed)
Pt declined

## 2015-06-27 NOTE — Progress Notes (Signed)
   Subjective:    Patient ID: [name], female    DOB: 01-03-1971, 44 y.o.   MRN: [id number]  HPI patient is a 44 year old female who presents to the clinic for medication refills and to discuss ongoing vaginal bleeding. She has had irregular menstrual cycles for the past 6 months. In August she had a 60 day cycle. Now she had a 40 day cycle but she's had 4 weeks of bleeding. Her normal bleeding is 3-5 days. She admits to seeing clots. She feels very weak and fatigued. She has been taking the compounded Prometrium and cream. It seems to have helped with her mood but not with bleeding. She denies any abdominal pain or cramping.  Palpitations/sinus tachycardia/PVCs-they have much improved with propanolol. She takes it 3 times a day. She denies any chest pains.  Anxiety-patient's anxiety has much improved with BuSpar and Zoloft along with as needed Xanax.    Review of Systems  All other systems reviewed and are negative.      Objective:   Physical Exam  Constitutional: She is oriented to person, place, and time. She appears well-developed and well-nourished.  HENT:  Head: Normocephalic and atraumatic.  Cardiovascular: Normal rate, regular rhythm and normal heart sounds.   Pulmonary/Chest: Effort normal and breath sounds normal.  Abdominal: Soft. Bowel sounds are normal. She exhibits no distension and no mass. There is no tenderness. There is no rebound and no guarding.  Neurological: She is alert and oriented to person, place, and time.  Psychiatric: She has a normal mood and affect. Her behavior is normal.          Assessment & Plan:  Dysfunctional uterine bleeding- patient is already taking Prometrium at 50 mg. Told her to increase to 200 mg daily until she sees GYN. She is already established next door. I told her to go make an appointment with Dr. Gala Romney. She had a pelvic ultrasound earlier this year that was normal. TSH, progesterone, LH, FSH and CBC ordered.    PVCs/palpitations/sinus tachycardia-she is doing much better with the propanolol. She needs refills today.  Generalized anxiety-she does feel like her anxiety has much improved. She needs a refill on Zoloft 50 mg. She does not want to consider increasing today. She is also taking BuSpar 3 times a day. She rarely takes Xanax but she has it for when needed.

## 2015-06-28 DIAGNOSIS — N938 Other specified abnormal uterine and vaginal bleeding: Secondary | ICD-10-CM | POA: Insufficient documentation

## 2015-06-28 LAB — FOLLICLE STIMULATING HORMONE: FSH: 8.5 m[IU]/mL

## 2015-06-28 LAB — PROGESTERONE: PROGESTERONE: 0.4 ng/mL

## 2015-06-28 LAB — TSH: TSH: 2.279 u[IU]/mL (ref 0.350–4.500)

## 2015-06-28 LAB — FERRITIN: Ferritin: 60 ng/mL (ref 10–291)

## 2015-06-28 LAB — LUTEINIZING HORMONE: LH: 3.1 m[IU]/mL

## 2015-06-29 ENCOUNTER — Other Ambulatory Visit (HOSPITAL_COMMUNITY)
Admission: RE | Admit: 2015-06-29 | Discharge: 2015-06-29 | Disposition: A | Discharge status: Home / Self Care | Payer: Medicaid Other | Source: Ambulatory Visit | Attending: Obstetrics & Gynecology | Admitting: Obstetrics & Gynecology

## 2015-06-29 ENCOUNTER — Encounter: Payer: Self-pay | Admitting: Obstetrics & Gynecology

## 2015-06-29 ENCOUNTER — Ambulatory Visit (INDEPENDENT_AMBULATORY_CARE_PROVIDER_SITE_OTHER): Payer: Medicaid Other | Admitting: Obstetrics & Gynecology

## 2015-06-29 VITALS — BP 108/82 | HR 84 | Resp 16 | Ht 65.0 in | Wt 212.0 lb

## 2015-06-29 DIAGNOSIS — N938 Other specified abnormal uterine and vaginal bleeding: Secondary | ICD-10-CM | POA: Diagnosis not present

## 2015-06-29 DIAGNOSIS — Z01419 Encounter for gynecological examination (general) (routine) without abnormal findings: Secondary | ICD-10-CM | POA: Insufficient documentation

## 2015-06-29 DIAGNOSIS — Z1151 Encounter for screening for human papillomavirus (HPV): Secondary | ICD-10-CM | POA: Insufficient documentation

## 2015-06-29 DIAGNOSIS — Z0000 Encounter for general adult medical examination without abnormal findings: Secondary | ICD-10-CM

## 2015-06-29 DIAGNOSIS — Z124 Encounter for screening for malignant neoplasm of cervix: Secondary | ICD-10-CM

## 2015-06-29 MED ORDER — MEDROXYPROGESTERONE ACETATE 10 MG PO TABS: 10.0000 mg | ORAL_TABLET | Freq: Every day | ORAL | Status: DC

## 2015-06-29 NOTE — Progress Notes (Signed)
   Subjective:    Patient ID: [name], female    DOB: [birth year], 44 y.o.   MRN: [id number]  HPI 44 yo MW P4 (46, 18,17, and 67 yo kids) here with the issue of DUB. She has been bleeding for 5 weeks. Her hbg was 11.5. Her TSH, hormones, and gyn u/s were normal. She is using progesterone BID.   Review of Systems     Objective:   Physical Exam [id number] Breathing, conversing, and ambulating normally Cervix- very tiny, small amount of dark red blood noted Pap smear done       Assessment & Plan:  DUB-She would like a d&c with ablation In the meantime (until surgery), I will prescribe provera 10 mg days, cyclic

## 2015-06-30 LAB — CYTOLOGY - PAP

## 2015-07-03 ENCOUNTER — Encounter (HOSPITAL_COMMUNITY): Payer: Self-pay | Admitting: *Deleted

## 2015-07-05 ENCOUNTER — Telehealth: Payer: Self-pay | Admitting: *Deleted

## 2015-07-05 ENCOUNTER — Encounter (HOSPITAL_COMMUNITY): Payer: Self-pay | Admitting: *Deleted

## 2015-07-05 ENCOUNTER — Other Ambulatory Visit: Payer: Self-pay | Admitting: *Deleted

## 2015-07-05 MED ORDER — NEBIVOLOL HCL 5 MG PO TABS: 5.0000 mg | ORAL_TABLET | Freq: Every day | ORAL | Status: DC

## 2015-07-05 NOTE — Telephone Encounter (Signed)
Pt left vm stating that the propanolol seemed to work well for the first couple weeks but she started to feel her heart race.  She stopped it & went back to the Bystolic.  She just wanted you to know.

## 2015-07-05 NOTE — Telephone Encounter (Signed)
Can you update her med list.

## 2015-07-05 NOTE — Telephone Encounter (Signed)
Done

## 2015-07-18 ENCOUNTER — Ambulatory Visit: Admit: 2015-07-18 | Payer: Medicaid Other | Admitting: Obstetrics & Gynecology

## 2015-07-18 SURGERY — DILATATION & CURETTAGE/HYSTEROSCOPY WITH HYDROTHERMAL ABLATION
Anesthesia: Choice | Site: Vagina

## 2015-07-22 ENCOUNTER — Other Ambulatory Visit: Payer: Self-pay | Admitting: Physician Assistant

## 2015-08-24 ENCOUNTER — Ambulatory Visit: Admit: 2015-08-24 | Payer: Medicaid Other | Admitting: Obstetrics & Gynecology

## 2015-08-24 SURGERY — DILATATION & CURETTAGE/HYSTEROSCOPY WITH HYDROTHERMAL ABLATION
Anesthesia: Choice | Site: Vagina

## 2015-09-09 ENCOUNTER — Other Ambulatory Visit: Payer: Self-pay | Admitting: Cardiology

## 2015-09-11 NOTE — Telephone Encounter (Signed)
Rx request sent to pharmacy.  

## 2015-10-10 ENCOUNTER — Other Ambulatory Visit: Payer: Self-pay | Admitting: Physician Assistant

## 2015-10-22 ENCOUNTER — Other Ambulatory Visit: Payer: Self-pay | Admitting: Cardiology

## 2015-11-23 ENCOUNTER — Other Ambulatory Visit: Payer: Self-pay | Admitting: Cardiology

## 2015-11-23 NOTE — Telephone Encounter (Signed)
Rx(s) sent to pharmacy electronically.  

## 2015-11-27 ENCOUNTER — Other Ambulatory Visit: Payer: Self-pay | Admitting: Physician Assistant

## 2015-12-11 ENCOUNTER — Encounter: Payer: Self-pay | Admitting: Physician Assistant

## 2015-12-11 ENCOUNTER — Ambulatory Visit (INDEPENDENT_AMBULATORY_CARE_PROVIDER_SITE_OTHER): Payer: Managed Care, Other (non HMO) | Admitting: Physician Assistant

## 2015-12-11 VITALS — BP 129/81 | HR 89 | Ht 65.0 in | Wt 222.0 lb

## 2015-12-11 DIAGNOSIS — Z803 Family history of malignant neoplasm of breast: Secondary | ICD-10-CM | POA: Diagnosis not present

## 2015-12-11 DIAGNOSIS — N63 Unspecified lump in breast: Secondary | ICD-10-CM | POA: Diagnosis not present

## 2015-12-11 DIAGNOSIS — Z808 Family history of malignant neoplasm of other organs or systems: Secondary | ICD-10-CM

## 2015-12-11 DIAGNOSIS — H578 Other specified disorders of eye and adnexa: Secondary | ICD-10-CM

## 2015-12-11 DIAGNOSIS — H5789 Other specified disorders of eye and adnexa: Secondary | ICD-10-CM

## 2015-12-11 DIAGNOSIS — N6310 Unspecified lump in the right breast, unspecified quadrant: Secondary | ICD-10-CM

## 2015-12-11 MED ORDER — ALPRAZOLAM 0.5 MG PO TABS: ORAL_TABLET | ORAL | Status: DC

## 2015-12-12 ENCOUNTER — Encounter: Payer: Self-pay | Admitting: Physician Assistant

## 2015-12-12 DIAGNOSIS — H5789 Other specified disorders of eye and adnexa: Secondary | ICD-10-CM | POA: Insufficient documentation

## 2015-12-12 DIAGNOSIS — Z808 Family history of malignant neoplasm of other organs or systems: Secondary | ICD-10-CM | POA: Insufficient documentation

## 2015-12-12 DIAGNOSIS — Z803 Family history of malignant neoplasm of breast: Secondary | ICD-10-CM | POA: Insufficient documentation

## 2015-12-12 NOTE — Progress Notes (Signed)
   Subjective:    Patient ID: [name], female    DOB: 22-Dec-1970, 45 y.o.   MRN: [id number]  HPI  Pt presents to the clinic to get a MRI ordered for breast. Her mother was just diagnosed with invasive breast cancer and her sister was diagnosed with melanoma. She has hx of abnormal mammogram with right breast mass. She has not had recent imaging. She does have some red eye and did itch at first but not no symptoms. No eye pain. Not tried anything to make better. No other allergy symptoms.   Review of Systems    see HPI.  Objective:   Physical Exam  Constitutional: She appears well-developed and well-nourished.  Obese.   Eyes: EOM are normal. Pupils are equal, round, and reactive to light. No scleral icterus.    Psychiatric: She has a normal mood and affect. Her behavior is normal.          Assessment & Plan:  Hx of right breast mass/family hx of breast cancer and melanoma- ordered breast MRI today due to fam and personal hx of right breast mass. Discussed need for BRCA testing.    Redness of medial left eye- blood vessels are particularly prominent. Eye exam normal today. Advised to see opthalmology for complete eye exam especially due to fam hx of melanoma.

## 2015-12-18 ENCOUNTER — Other Ambulatory Visit: Payer: Self-pay | Admitting: Physician Assistant

## 2015-12-18 DIAGNOSIS — Z803 Family history of malignant neoplasm of breast: Secondary | ICD-10-CM

## 2015-12-18 DIAGNOSIS — R2231 Localized swelling, mass and lump, right upper limb: Secondary | ICD-10-CM

## 2015-12-22 ENCOUNTER — Ambulatory Visit
Admission: RE | Admit: 2015-12-22 | Discharge: 2015-12-22 | Disposition: A | Discharge status: Home / Self Care | Payer: Managed Care, Other (non HMO) | Source: Ambulatory Visit | Attending: Physician Assistant | Admitting: Physician Assistant

## 2015-12-22 DIAGNOSIS — R2231 Localized swelling, mass and lump, right upper limb: Secondary | ICD-10-CM

## 2015-12-22 DIAGNOSIS — Z803 Family history of malignant neoplasm of breast: Secondary | ICD-10-CM

## 2015-12-25 ENCOUNTER — Telehealth: Payer: Self-pay | Admitting: *Deleted

## 2015-12-25 DIAGNOSIS — E041 Nontoxic single thyroid nodule: Secondary | ICD-10-CM

## 2015-12-25 NOTE — Telephone Encounter (Signed)
Follow-up thyroid ultrasound ordered.

## 2015-12-27 ENCOUNTER — Telehealth: Payer: Self-pay | Admitting: Physician Assistant

## 2015-12-27 NOTE — Telephone Encounter (Signed)
Case was began via [facility] on 12/12/15, submitted for additional clinical review. Called [facility] to check status, imaging denied due to not meeting plan medical requirements. They require Pt to have mammogram, [place], and biopsy before medical necessity is met. Will notify imaging facility, PCP, and Pt of denial. Per imaging facility, if order is put back in it will need to be MRI Bilateral Breast W and W/O contrast.

## 2015-12-30 ENCOUNTER — Inpatient Hospital Stay: Admission: RE | Admit: 2015-12-30 | Payer: Managed Care, Other (non HMO) | Source: Ambulatory Visit

## 2016-01-10 ENCOUNTER — Other Ambulatory Visit: Payer: Self-pay | Admitting: Physician Assistant

## 2016-01-17 ENCOUNTER — Other Ambulatory Visit: Payer: Self-pay | Admitting: Cardiology

## 2016-01-17 NOTE — Telephone Encounter (Signed)
Rx(s) sent to pharmacy electronically.  

## 2016-01-26 ENCOUNTER — Ambulatory Visit (INDEPENDENT_AMBULATORY_CARE_PROVIDER_SITE_OTHER): Payer: Managed Care, Other (non HMO)

## 2016-01-26 DIAGNOSIS — E041 Nontoxic single thyroid nodule: Secondary | ICD-10-CM

## 2016-01-29 ENCOUNTER — Encounter: Payer: Self-pay | Admitting: Physician Assistant

## 2016-01-29 DIAGNOSIS — E041 Nontoxic single thyroid nodule: Secondary | ICD-10-CM | POA: Insufficient documentation

## 2016-02-03 ENCOUNTER — Other Ambulatory Visit: Payer: Self-pay | Admitting: Physician Assistant

## 2016-02-18 ENCOUNTER — Other Ambulatory Visit: Payer: Self-pay | Admitting: Physician Assistant

## 2016-02-29 ENCOUNTER — Ambulatory Visit (INDEPENDENT_AMBULATORY_CARE_PROVIDER_SITE_OTHER): Payer: Managed Care, Other (non HMO) | Admitting: Physician Assistant

## 2016-02-29 ENCOUNTER — Encounter: Payer: Self-pay | Admitting: Physician Assistant

## 2016-02-29 VITALS — BP 133/85 | HR 90 | Ht 65.0 in | Wt 223.0 lb

## 2016-02-29 DIAGNOSIS — H6092 Unspecified otitis externa, left ear: Secondary | ICD-10-CM | POA: Diagnosis not present

## 2016-02-29 DIAGNOSIS — H3020 Posterior cyclitis, unspecified eye: Secondary | ICD-10-CM | POA: Insufficient documentation

## 2016-02-29 DIAGNOSIS — H3022 Posterior cyclitis, left eye: Secondary | ICD-10-CM

## 2016-02-29 DIAGNOSIS — I493 Ventricular premature depolarization: Secondary | ICD-10-CM

## 2016-02-29 DIAGNOSIS — I10 Essential (primary) hypertension: Secondary | ICD-10-CM | POA: Diagnosis not present

## 2016-02-29 DIAGNOSIS — F411 Generalized anxiety disorder: Secondary | ICD-10-CM

## 2016-02-29 DIAGNOSIS — H2012 Chronic iridocyclitis, left eye: Secondary | ICD-10-CM

## 2016-02-29 DIAGNOSIS — H2010 Chronic iridocyclitis, unspecified eye: Secondary | ICD-10-CM

## 2016-02-29 MED ORDER — CIPROFLOXACIN-DEXAMETHASONE 0.3-0.1 % OT SUSP: 4.0000 [drp] | Freq: Two times a day (BID) | OTIC | Status: AC

## 2016-02-29 MED ORDER — NEBIVOLOL HCL 5 MG PO TABS
5.0000 mg | ORAL_TABLET | Freq: Every day | ORAL | Status: DC
Start: 2016-02-29 — End: 2016-09-17

## 2016-02-29 MED ORDER — ALPRAZOLAM 0.5 MG PO TABS: ORAL_TABLET | ORAL | Status: DC

## 2016-02-29 MED ORDER — SERTRALINE HCL 50 MG PO TABS: ORAL_TABLET | ORAL | Status: DC

## 2016-02-29 MED ORDER — BUSPIRONE HCL 7.5 MG PO TABS: 7.5000 mg | ORAL_TABLET | Freq: Two times a day (BID) | ORAL | Status: DC

## 2016-02-29 NOTE — Progress Notes (Addendum)
   Subjective:    Patient ID: [name], female    DOB: April 04, 1971, 45 y.o.   MRN: [id number]  HPI Patient is a 45 year old female that present with a CC of left ear pain that started 2 days ago. She states the pain shoots toward her jaw. She reports associated itchiness and tenderness of the right ear and nasal congestion. She denies ear discharge, hearing loss, sinus pressure, sore throat, cough, N/V, changes in BM, TMJ pain, or headache. She has not been swimming recently.   She also states that she was unable to get a breast MRI due to insurance. She has a strong family history of cancer and a personal history of dense breasts that makes mammogram difficult to interpret.    Pt has had uveitis that was diagnosed by opthamology twice. She knows this is related to auto-immune diseases. She wants testing to determine what auto-immune disease she might have.   Hypertension/palpitations- needs refills. Taking biosytolic daily. Denies any CP, headaches or dizziness. Palpitations are much improved.   Anxiety- doing well on zoloft, buspar, xanax. Needs refills. No complaints.    Review of Systems  All other systems reviewed and are negative.     Objective:   Physical Exam  Constitutional: She is oriented to person, place, and time. She appears well-developed and well-nourished.  HENT:  Head: Normocephalic and atraumatic.  Right Ear: Tympanic membrane and external ear normal.  Left ear: Positive tragus sign. No mastoid tenderness. External canal is swollen. Unable to visualize TM.  Cardiovascular: Normal rate, regular rhythm and normal heart sounds.   Pulmonary/Chest: Effort normal and breath sounds normal.  Neurological: She is alert and oriented to person, place, and time.  Skin: Skin is warm and dry.  Psychiatric: She has a normal mood and affect. Her behavior is normal.      Assessment & Plan:  Otitis Externa, left- Ciprodex given for 7-10 days. Return to clinic if symptoms  worsen or do not improve.  Hypertension/palpitations- refill of Bystolic given today.BP controlled.   Anxiety- well controlled with Sertraline, buspar and prn alprazolam. Refills given today.    Hx of recurrent uveitis- TSH, TPO  ordered to look for autoimmune condition. Referral to Rheumatology for further autoimmune work up made.  Breast cancer screening- will check to see with insurance what is needed to get her authorized for breast MRI.

## 2016-02-29 NOTE — Addendum Note (Signed)
Addended by: Donella Stade on: 02/29/2016 05:13 PM   Modules accepted: Level of Service

## 2016-02-29 NOTE — Patient Instructions (Signed)

## 2016-03-01 LAB — THYROID PEROXIDASE ANTIBODY: Thyroperoxidase Ab SerPl-aCnc: 1 IU/mL (ref <9)

## 2016-03-01 LAB — TSH: TSH: 1.92 m[IU]/L

## 2016-03-04 ENCOUNTER — Other Ambulatory Visit: Payer: Self-pay | Admitting: Physician Assistant

## 2016-03-04 DIAGNOSIS — Z803 Family history of malignant neoplasm of breast: Secondary | ICD-10-CM

## 2016-03-16 ENCOUNTER — Ambulatory Visit
Admission: RE | Admit: 2016-03-16 | Discharge: 2016-03-16 | Disposition: A | Discharge status: Home / Self Care | Payer: Managed Care, Other (non HMO) | Source: Ambulatory Visit | Attending: Physician Assistant | Admitting: Physician Assistant

## 2016-03-16 DIAGNOSIS — Z803 Family history of malignant neoplasm of breast: Secondary | ICD-10-CM

## 2016-03-16 MED ORDER — GADOBENATE DIMEGLUMINE 529 MG/ML IV SOLN: 20.0000 mL | Freq: Once | INTRAVENOUS | Status: DC | PRN

## 2016-03-17 ENCOUNTER — Other Ambulatory Visit: Payer: Self-pay | Admitting: Physician Assistant

## 2016-03-18 ENCOUNTER — Telehealth: Payer: Self-pay | Admitting: *Deleted

## 2016-03-18 ENCOUNTER — Other Ambulatory Visit: Payer: Self-pay | Admitting: Physician Assistant

## 2016-03-18 DIAGNOSIS — N6310 Unspecified lump in the right breast, unspecified quadrant: Secondary | ICD-10-CM

## 2016-03-18 NOTE — Telephone Encounter (Signed)
Breast ultrasound ordered

## 2016-03-21 ENCOUNTER — Ambulatory Visit
Admission: RE | Admit: 2016-03-21 | Discharge: 2016-03-21 | Disposition: A | Discharge status: Home / Self Care | Payer: Managed Care, Other (non HMO) | Source: Ambulatory Visit | Attending: Physician Assistant | Admitting: Physician Assistant

## 2016-03-21 DIAGNOSIS — N6310 Unspecified lump in the right breast, unspecified quadrant: Secondary | ICD-10-CM

## 2016-03-21 DIAGNOSIS — R928 Other abnormal and inconclusive findings on diagnostic imaging of breast: Secondary | ICD-10-CM

## 2016-03-24 ENCOUNTER — Other Ambulatory Visit: Payer: Self-pay | Admitting: Physician Assistant

## 2016-04-19 ENCOUNTER — Other Ambulatory Visit: Payer: Self-pay | Admitting: Rheumatology

## 2016-04-19 DIAGNOSIS — M533 Sacrococcygeal disorders, not elsewhere classified: Secondary | ICD-10-CM

## 2016-05-01 ENCOUNTER — Other Ambulatory Visit: Payer: Managed Care, Other (non HMO)

## 2016-05-10 ENCOUNTER — Ambulatory Visit
Admission: RE | Admit: 2016-05-10 | Discharge: 2016-05-10 | Disposition: A | Discharge status: Home / Self Care | Payer: Medicaid Other | Source: Ambulatory Visit | Attending: Rheumatology | Admitting: Rheumatology

## 2016-05-10 DIAGNOSIS — M533 Sacrococcygeal disorders, not elsewhere classified: Secondary | ICD-10-CM

## 2016-05-10 MED ORDER — GADOBENATE DIMEGLUMINE 529 MG/ML IV SOLN
20.0000 mL | Freq: Once | INTRAVENOUS | Status: AC | PRN
  Administered 2016-05-10: 20 mL via INTRAVENOUS

## 2016-05-21 ENCOUNTER — Ambulatory Visit (INDEPENDENT_AMBULATORY_CARE_PROVIDER_SITE_OTHER): Payer: Managed Care, Other (non HMO) | Admitting: Sports Medicine

## 2016-05-21 ENCOUNTER — Encounter: Payer: Self-pay | Admitting: Sports Medicine

## 2016-05-21 ENCOUNTER — Ambulatory Visit (INDEPENDENT_AMBULATORY_CARE_PROVIDER_SITE_OTHER): Payer: Managed Care, Other (non HMO)

## 2016-05-21 DIAGNOSIS — R221 Localized swelling, mass and lump, neck: Secondary | ICD-10-CM

## 2016-05-21 DIAGNOSIS — R635 Abnormal weight gain: Secondary | ICD-10-CM | POA: Diagnosis not present

## 2016-05-21 NOTE — Assessment & Plan Note (Signed)
Ultrasound, I do suspect that this is really nothing. We will simply watch and wait.

## 2016-05-21 NOTE — Assessment & Plan Note (Signed)
She has also had nonspecific fatigue, weight gain, questionable uveitis that has responded to symptomatic treatment, has had a full evaluation by rheumatology which was negative as expected. I'm happy to run some additional blood work however I think her weight gain would be best treated pharmacologically, she is worried well. She has had multiple TSH tests, I am going to add a serum cortisol level. She will follow-up with her PCP regarding weight loss treatment.

## 2016-05-21 NOTE — Progress Notes (Signed)
  Subjective:    CC: Mass on neck  HPI: For the past 1 day this 45 year old female has noted a fullness on the right side of her neck, nontender, no trauma, no constitutional symptoms.  She also endorses vague symptoms with a multitude of systems involved including fatigue, weight gain, questionable uveitis that has since been treated with topical drops by her ophthalmologist. She has had a full rheumatologic workup that was completely negative. Her main concern seems to be weight gain.  Past medical history:  Negative.  See flowsheet/record as well for more information.  Surgical history: Negative.  See flowsheet/record as well for more information.  Family history: Negative.  See flowsheet/record as well for more information.  Social history: Negative.  See flowsheet/record as well for more information.  Allergies, and medications have been entered into the medical record, reviewed, and no changes needed.   Review of Systems: No fevers, chills, night sweats, weight loss, chest pain, or shortness of breath.   Objective:    General: Well Developed, well nourished, and in no acute distress.  Neuro: Alert and oriented x3, extra-ocular muscles intact, sensation grossly intact.  HEENT: Normocephalic, atraumatic, pupils equal round reactive to light, neck supple, no masses, no lymphadenopathy, thyroid nonpalpable. Oropharynx, nasopharynx, ear canals unremarkable, there is a slight fullness on the right side of the neck that is nontender, no crepitation. Approximately 3-4 cm across. Almost not noticeable/imperceptible. Skin: Warm and dry, no rashes. Cardiac: Regular rate and rhythm, no murmurs rubs or gallops, no lower extremity edema.  Respiratory: Clear to auscultation bilaterally. Not using accessory muscles, speaking in full sentences.  Impression and Recommendations:    Mass in neck Ultrasound, I do suspect that this is really nothing. We will simply watch and wait.  Weight gain She  has also had nonspecific fatigue, weight gain, questionable uveitis that has responded to symptomatic treatment, has had a full evaluation by rheumatology which was negative as expected. I'm happy to run some additional blood work however I think her weight gain would be best treated pharmacologically, she is worried well. She has had multiple TSH tests, I am going to add a serum cortisol level. She will follow-up with her PCP regarding weight loss treatment.  I spent 25 minutes with this patient, greater than 50% was face-to-face time counseling regarding the above diagnoses

## 2016-05-27 LAB — CORTISOL, FREE: Cortisol Free, Ser: 0.28 ug/dL

## 2016-07-09 ENCOUNTER — Encounter: Payer: Self-pay | Admitting: Sports Medicine

## 2016-09-13 ENCOUNTER — Ambulatory Visit: Payer: Managed Care, Other (non HMO) | Admitting: Physician Assistant

## 2016-09-17 ENCOUNTER — Ambulatory Visit (INDEPENDENT_AMBULATORY_CARE_PROVIDER_SITE_OTHER): Payer: Managed Care, Other (non HMO) | Admitting: Physician Assistant

## 2016-09-17 ENCOUNTER — Encounter: Payer: Self-pay | Admitting: Physician Assistant

## 2016-09-17 VITALS — BP 134/82 | HR 87 | Ht 65.0 in | Wt 231.0 lb

## 2016-09-17 DIAGNOSIS — G9332 Myalgic encephalomyelitis/chronic fatigue syndrome: Secondary | ICD-10-CM | POA: Insufficient documentation

## 2016-09-17 DIAGNOSIS — R5382 Chronic fatigue, unspecified: Secondary | ICD-10-CM

## 2016-09-17 DIAGNOSIS — E669 Obesity, unspecified: Secondary | ICD-10-CM

## 2016-09-17 DIAGNOSIS — F411 Generalized anxiety disorder: Secondary | ICD-10-CM

## 2016-09-17 DIAGNOSIS — I493 Ventricular premature depolarization: Secondary | ICD-10-CM

## 2016-09-17 MED ORDER — LIRAGLUTIDE -WEIGHT MANAGEMENT 18 MG/3ML ~~LOC~~ SOPN: 0.6000 mg | PEN_INJECTOR | Freq: Every day | SUBCUTANEOUS | 1 refills | Status: DC

## 2016-09-17 MED ORDER — SERTRALINE HCL 50 MG PO TABS: ORAL_TABLET | ORAL | 1 refills | Status: DC

## 2016-09-17 MED ORDER — ALPRAZOLAM 0.5 MG PO TABS: ORAL_TABLET | ORAL | 3 refills | Status: DC

## 2016-09-17 MED ORDER — BUSPIRONE HCL 7.5 MG PO TABS: 7.5000 mg | ORAL_TABLET | Freq: Two times a day (BID) | ORAL | 1 refills | Status: DC

## 2016-09-17 MED ORDER — NEBIVOLOL HCL 5 MG PO TABS: 5.0000 mg | ORAL_TABLET | Freq: Every day | ORAL | 1 refills | Status: DC

## 2016-09-17 NOTE — Progress Notes (Signed)
   Subjective:    Patient ID: [name], female    DOB: 1970-11-04, 46 y.o.   MRN: [id number]  HPI  Pt is a 46 yo female who presents to the clinic for 6 month follow up.   She saw rheumatology recently and numerous test were ran that were all negative except a little elevation with CRP. She was diagnosed with Chronic Fatigue Syndrome. She knows there is not a lot we can do but she wants to discuss weight loss.   PVC's improved. On bystolic. Needs refills.    Review of Systems  All other systems reviewed and are negative.      Objective:   Physical Exam  Constitutional: She is oriented to person, place, and time. She appears well-developed and well-nourished.  Obese.   HENT:  Head: Normocephalic and atraumatic.  Cardiovascular: Normal rate, regular rhythm and normal heart sounds.   Pulmonary/Chest: Effort normal and breath sounds normal.  Neurological: She is alert and oriented to person, place, and time.  Psychiatric: She has a normal mood and affect. Her behavior is normal.          Assessment & Plan:  Marland KitchenMarland KitchenShivanya was seen today for obesity.  Diagnoses and all orders for this visit:  Obesity (BMI 35.0-39.9 without comorbidity) -     Liraglutide -Weight Management (SAXENDA) 18 MG/3ML SOPN; Inject 0.6 mg into the skin daily. For one week then increase by .6mg  weekly until reaches 3mg  daily.  Please include ultra fine needles 49mm  PVC's (premature ventricular contractions) -     nebivolol (BYSTOLIC) 5 MG tablet; Take 1 tablet (5 mg total) by mouth daily.  Chronic fatigue syndrome  Anxiety state -     sertraline (ZOLOFT) 50 MG tablet; TAKE 1 TABLET (50 MG TOTAL) BY MOUTH DAILY. -     busPIRone (BUSPAR) 7.5 MG tablet; Take 1 tablet (7.5 mg total) by mouth 2 (two) times daily. -     ALPRAZolam (XANAX) 0.5 MG tablet; TAKE 1 TABLET BY MOUTH TWICE A DAY AS NEEDED FOR ANXIETY.   Will target weight loss.  Discussed 1200 calorie diet.  150 minutes of exercise a  week.  Started saxenda. Discussed SE's and taper up.  Follow up in 2 months.  Coupon card given.  Pt is not a candidate for phentermine due to PVc's.  Spent 30 minutes with patient and greater than 50 percent of visit spent counseling patient about weight loss.

## 2016-09-17 NOTE — Patient Instructions (Addendum)
150 minutes a week.  1200-1500 calories a day.

## 2016-09-19 DIAGNOSIS — E669 Obesity, unspecified: Secondary | ICD-10-CM | POA: Insufficient documentation

## 2016-09-23 ENCOUNTER — Telehealth: Payer: Self-pay | Admitting: Physician Assistant

## 2016-09-23 MED ORDER — LIRAGLUTIDE 18 MG/3ML ~~LOC~~ SOPN: PEN_INJECTOR | SUBCUTANEOUS | 1 refills | Status: DC

## 2016-09-23 NOTE — Telephone Encounter (Signed)
Pt states the insurance will not cover Saxenda, but will cover Victoza. [facility] Routing to PCP for new Rx.

## 2016-09-23 NOTE — Telephone Encounter (Signed)
I sent victoza;however, insurance at times will not pay for victoza if does not have diabetes. We will see.

## 2016-09-24 NOTE — Telephone Encounter (Signed)
Pt advised.

## 2016-09-26 ENCOUNTER — Telehealth: Payer: Self-pay | Admitting: *Deleted

## 2016-09-26 NOTE — Telephone Encounter (Signed)
PA submitted through covermymed [facility]

## 2016-09-30 ENCOUNTER — Telehealth: Payer: Self-pay

## 2016-09-30 NOTE — Telephone Encounter (Signed)
Pt called requesting 2 samples of xaxenda. Pt states her insurance denied her pre authorization for it. Pt states she applied for financial assistance through [facility]. Talked to [facility] PA-C and contacted rep at [employer] to get samples.

## 2016-10-01 NOTE — Telephone Encounter (Signed)
victoza was denied

## 2016-10-13 ENCOUNTER — Other Ambulatory Visit: Payer: Self-pay | Admitting: Physician Assistant

## 2016-10-16 ENCOUNTER — Telehealth: Payer: Self-pay

## 2016-10-16 NOTE — Telephone Encounter (Signed)
Ok thanks 

## 2016-10-16 NOTE — Telephone Encounter (Signed)
FYI  There will be a form for Victoza from a medication assistance program. Please do not throw it away. It just has general questions and doesn't ask for the reason/Dx.

## 2016-10-18 ENCOUNTER — Other Ambulatory Visit: Payer: Self-pay | Admitting: *Deleted

## 2016-10-18 MED ORDER — LIRAGLUTIDE 18 MG/3ML ~~LOC~~ SOPN: PEN_INJECTOR | SUBCUTANEOUS | 1 refills | Status: DC

## 2016-10-18 MED ORDER — LIRAGLUTIDE 18 MG/3ML ~~LOC~~ SOPN: PEN_INJECTOR | SUBCUTANEOUS | 2 refills | Status: DC

## 2016-10-24 ENCOUNTER — Telehealth: Payer: Self-pay | Admitting: Physician Assistant

## 2016-10-24 NOTE — Telephone Encounter (Signed)
I received an email from patient experience this morning regarding her Victoza prescription.  I called and LM for the patient to call me directly at [phone].  Rolland Bimler, RN spoke with [name] yesterday to let her know that the prescription along with the Rx [facility] letter was mailed Friday (10/18/16) to [employer] [facility].

## 2016-11-12 ENCOUNTER — Telehealth: Payer: Self-pay | Admitting: Cardiology

## 2016-11-12 NOTE — Telephone Encounter (Addendum)
Attempt to return call-busy signal x 2

## 2016-11-12 NOTE — Telephone Encounter (Signed)
New message    Cecille Rubin is calling to find out if med clearance was received that was faxed over 11/01/16 and 11/06/16.

## 2016-11-13 NOTE — Telephone Encounter (Signed)
Patient is going to see Iran Planas for clearance. Clearance form faxed to her @ [phone].

## 2016-11-13 NOTE — Telephone Encounter (Signed)
Spoke with pt, she has not been seen since 2016 and would need an appointment for clearance. She is scheduled for Tuesday next week and reports she has already taken a leave from work for the procedure and can not change it. She also reports she would not be able to come to [place] to be seen, she has no transportation. She ask that I reach out to [employer] pa to see if they will do the clearance. She saw her in february this year, left a message for amber, jade's assistant to please call to discuss.

## 2016-11-13 NOTE — Telephone Encounter (Signed)
Spoke to dental office   informed not received  Dental -cardiac clearance form  Please refax -    verbalized will  Refax  patient schedule  For April 10 ,2018

## 2016-11-18 ENCOUNTER — Ambulatory Visit (INDEPENDENT_AMBULATORY_CARE_PROVIDER_SITE_OTHER): Payer: 59 | Admitting: Physician Assistant

## 2016-11-18 ENCOUNTER — Encounter: Payer: Self-pay | Admitting: Physician Assistant

## 2016-11-18 VITALS — BP 118/76 | HR 93 | Ht 65.0 in | Wt 222.0 lb

## 2016-11-18 DIAGNOSIS — K5903 Drug induced constipation: Secondary | ICD-10-CM | POA: Diagnosis not present

## 2016-11-18 DIAGNOSIS — I493 Ventricular premature depolarization: Secondary | ICD-10-CM

## 2016-11-18 DIAGNOSIS — Z01818 Encounter for other preprocedural examination: Secondary | ICD-10-CM | POA: Diagnosis not present

## 2016-11-18 MED ORDER — LINACLOTIDE 145 MCG PO CAPS: 145.0000 ug | ORAL_CAPSULE | Freq: Every day | ORAL | 1 refills | Status: DC

## 2016-11-18 NOTE — Progress Notes (Signed)
   Subjective:    Patient ID: [name], female    DOB: March 12, 1971, 46 y.o.   MRN: [id number]  HPI Pt is a 46 yo female with PMH of PVC's and on beta blocker who presents to the clinic for surgical clearance for bone graph of left upper jaw. Pt denies any palpitations, headaches, vision changes, SOB, chest pain. She is a former smoker.   Per patient Xray done with dentist showed thickened mucus lining in sinuses and was told to follow up with PCP.   Pt has struggled to have regular bowel movements since starting saxenda. She has tried duculoax, mag citrate, fiber, increasing hydration, and miralax. She is having hard stools every 4-10 days. She would like something for this.    Review of Systems    see HPI>  Objective:   Physical Exam  Constitutional: She is oriented to person, place, and time. She appears well-developed and well-nourished.  HENT:  Head: Normocephalic and atraumatic.  Cardiovascular: Normal rate, regular rhythm and normal heart sounds.   Pulmonary/Chest: Effort normal and breath sounds normal. She has no wheezes.  Abdominal: Soft. Bowel sounds are normal. She exhibits no distension and no mass. There is no tenderness. There is no rebound and no guarding.  Neurological: She is alert and oriented to person, place, and time.  Psychiatric: She has a normal mood and affect. Her behavior is normal.          Assessment & Plan:  Marland KitchenMarland KitchenAngelice was seen today for surgical clearance.  Diagnoses and all orders for this visit:  Pre-operative clearance -     EKG 12-Lead  PVC's (premature ventricular contractions)  Drug-induced constipation -     linaclotide (LINZESS) 145 MCG CAPS capsule; Take 1 capsule (145 mcg total) by mouth daily.     EKG- no changes from previous EKG on 03/2016.  Surgical Clearance form filled out. [place] for surgery.  Would consider adding zyrtec daily after surgery to see if could keep mucosal lining down. Follow up with any sinus  pressure/nasal congestion.   Added linzess, discussed side effects. Coupon card given. Follow up in 3 months.

## 2016-11-24 ENCOUNTER — Other Ambulatory Visit: Payer: Self-pay | Admitting: Physician Assistant

## 2016-11-26 ENCOUNTER — Telehealth: Payer: Self-pay

## 2016-11-26 NOTE — Telephone Encounter (Signed)
Pt's victoza is being sent here.  Please notify pt when it arrives.

## 2016-11-30 ENCOUNTER — Other Ambulatory Visit: Payer: Self-pay | Admitting: Physician Assistant

## 2017-01-10 ENCOUNTER — Encounter: Payer: Self-pay | Admitting: Physician Assistant

## 2017-01-10 ENCOUNTER — Ambulatory Visit (INDEPENDENT_AMBULATORY_CARE_PROVIDER_SITE_OTHER): Payer: 59 | Admitting: Physician Assistant

## 2017-01-10 VITALS — BP 114/79 | HR 92 | Ht 65.0 in | Wt 219.0 lb

## 2017-01-10 DIAGNOSIS — E559 Vitamin D deficiency, unspecified: Secondary | ICD-10-CM | POA: Diagnosis not present

## 2017-01-10 DIAGNOSIS — E785 Hyperlipidemia, unspecified: Secondary | ICD-10-CM

## 2017-01-10 DIAGNOSIS — E669 Obesity, unspecified: Secondary | ICD-10-CM

## 2017-01-10 DIAGNOSIS — Z79899 Other long term (current) drug therapy: Secondary | ICD-10-CM

## 2017-01-10 NOTE — Progress Notes (Signed)
   Subjective:    Patient ID: [name], female    DOB: 1970/09/08, 46 y.o.   MRN: [id number]  HPI  Pt is a 46 yo female who presents to the clinic for follow up on saxenda for weight loss. She continues to be on 1.8mg  dose and still losing weight. She has lost 12lbs since febuary and 3 more lbs since April. She is tolerating the medication well. She feels really good. She has more energy and her palpitations have completely resolved. She does get nauseated pretty easily and taking it easy on the titration.   .. Active Ambulatory Problems    Diagnosis Date Noted  . Anxiety state 06/08/2007  . Essential hypertension 06/08/2007  . PALPITATIONS 04/26/2008  . ABDOMINAL PAIN RIGHT LOWER QUADRANT 11/04/2007  . LUMBAR STRAIN 06/10/2007  . Breast mass, right 01/19/2014  . Sinus tachycardia 01/26/2014  . Hyperlipidemia 01/31/2014  . PVC's (premature ventricular contractions) 05/31/2014  . Weight gain 09/19/2014  . Other fatigue 09/19/2014  . Dry skin 09/19/2014  . OSA (obstructive sleep apnea) 04/03/2015  . Dysfunctional uterine bleeding 06/28/2015  . Family history of breast cancer 12/12/2015  . Family history of melanoma 12/12/2015  . Redness of left eye 12/12/2015  . Right thyroid nodule 01/29/2016  . Uveitis, intermediate 02/29/2016  . Mass in neck 05/21/2016  . Chronic fatigue syndrome 09/17/2016  . Obesity (BMI 35.0-39.9 without comorbidity) 09/19/2016  . Drug-induced constipation 11/18/2016   Resolved Ambulatory Problems    Diagnosis Date Noted  . No Resolved Ambulatory Problems   Past Medical History:  Diagnosis Date  . Anxiety   . Hyperlipidemia   . Hypertension   . Obesity (BMI 30-39.9)   . PVC's (premature ventricular contractions)        Review of Systems  All other systems reviewed and are negative.      Objective:   Physical Exam  Constitutional: She is oriented to person, place, and time. She appears well-developed and well-nourished.  HENT:   Head: Normocephalic and atraumatic.  Cardiovascular: Normal rate, regular rhythm and normal heart sounds.   Pulmonary/Chest: Effort normal and breath sounds normal. She has no wheezes.  Neurological: She is alert and oriented to person, place, and time.  Psychiatric: She has a normal mood and affect. Her behavior is normal.          Assessment & Plan:  .Diagnoses and all orders for this visit:  Obesity (BMI 35.0-39.9 without comorbidity)  Medication management -     COMPLETE METABOLIC PANEL WITH GFR  Hyperlipidemia, unspecified hyperlipidemia type -     Lipid Panel w/reflex Direct LDL  Vitamin D deficiency -     Vitamin D (25 hydroxy)   She is over the 5 percent weight loss. Pt is doing well. Encouraged to titrate up to the 3mg  dose for optimal weight loss. Encouraged adding in exercise as well. Follow up in 3 months.

## 2017-01-11 LAB — LIPID PANEL W/REFLEX DIRECT LDL
CHOL/HDL RATIO: 4.8 ratio (ref <5.0)
CHOLESTEROL: 218 mg/dL — AB (ref <200)
HDL: 45 mg/dL — ABNORMAL LOW (ref >50)
LDL-CHOLESTEROL: 137 mg/dL — AB
NON-HDL CHOLESTEROL (CALC): 173 mg/dL — AB (ref <130)
Triglycerides: 221 mg/dL — ABNORMAL HIGH (ref <150)

## 2017-01-11 LAB — VITAMIN D 25 HYDROXY (VIT D DEFICIENCY, FRACTURES): Vit D, 25-Hydroxy: 24 ng/mL — ABNORMAL LOW (ref 30–100)

## 2017-01-11 LAB — COMPLETE METABOLIC PANEL WITH GFR
ALT: 28 U/L (ref 6–29)
AST: 27 U/L (ref 10–35)
Albumin: 4.1 g/dL (ref 3.6–5.1)
Alkaline Phosphatase: 68 U/L (ref 33–115)
BUN: 10 mg/dL (ref 7–25)
CALCIUM: 9.1 mg/dL (ref 8.6–10.2)
CHLORIDE: 107 mmol/L (ref 98–110)
CO2: 18 mmol/L — AB (ref 20–31)
Creat: 0.64 mg/dL (ref 0.50–1.10)
GFR, Est Non African American: >89 mL/min (ref >=60)
Glucose, Bld: 93 mg/dL (ref 65–99)
POTASSIUM: 4.6 mmol/L (ref 3.5–5.3)
Sodium: 139 mmol/L (ref 135–146)
Total Bilirubin: 0.4 mg/dL (ref 0.2–1.2)
Total Protein: 6.8 g/dL (ref 6.1–8.1)

## 2017-01-13 ENCOUNTER — Encounter: Payer: Self-pay | Admitting: Physician Assistant

## 2017-01-13 DIAGNOSIS — E785 Hyperlipidemia, unspecified: Secondary | ICD-10-CM | POA: Insufficient documentation

## 2017-01-13 NOTE — Progress Notes (Signed)
Call pt: kidney, liver, glucose look good.  Vitamin D low. Are you taking any?  Cholesterol not great.  TG elevated, HDL low, LDL elevated. I would start OTC red yeast rice 1200mg  bid and continue to work on diet/exercise and recheck in 6 monhts.

## 2017-03-27 ENCOUNTER — Other Ambulatory Visit: Payer: Self-pay

## 2017-03-27 DIAGNOSIS — I493 Ventricular premature depolarization: Secondary | ICD-10-CM

## 2017-03-27 MED ORDER — NEBIVOLOL HCL 5 MG PO TABS: 5.0000 mg | ORAL_TABLET | Freq: Every day | ORAL | 0 refills | Status: DC

## 2017-03-27 NOTE — Telephone Encounter (Signed)
Patient request refill for  Bystolic 5 mg. Patient advised that appointment is needed for further refills. Rhonda Cunningham,CMA

## 2017-04-09 ENCOUNTER — Telehealth: Payer: Self-pay | Admitting: *Deleted

## 2017-04-09 NOTE — Telephone Encounter (Signed)
Patient left a message on vm yesterday asking to send 90 day supply of victoza to patient assistance program. [phone]). Called and left a message on vm that patient should call the office and schedule a nurse visit weight and BP check

## 2017-04-11 ENCOUNTER — Ambulatory Visit (INDEPENDENT_AMBULATORY_CARE_PROVIDER_SITE_OTHER): Payer: 59 | Admitting: Physician Assistant

## 2017-04-11 VITALS — BP 118/89 | HR 82 | Ht 65.0 in | Wt 219.0 lb

## 2017-04-11 DIAGNOSIS — E669 Obesity, unspecified: Secondary | ICD-10-CM

## 2017-04-11 NOTE — Progress Notes (Signed)
Pt in today for weight check for Victoza.  She has not lost any or gained any since last visit.  She states that her scales have been reading 213-215 and that this is her "period week".  [name].Discussed low carb diet with 1500 calories and 80g of protein.  Exercising at least 150 minutes a week.  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  Continue with victoza. Iran Planas PA-c

## 2017-04-16 ENCOUNTER — Other Ambulatory Visit: Payer: Self-pay | Admitting: *Deleted

## 2017-04-16 MED ORDER — LIRAGLUTIDE 18 MG/3ML ~~LOC~~ SOPN: PEN_INJECTOR | SUBCUTANEOUS | 2 refills | Status: DC

## 2017-04-24 ENCOUNTER — Other Ambulatory Visit: Payer: Self-pay | Admitting: Physician Assistant

## 2017-04-24 DIAGNOSIS — I493 Ventricular premature depolarization: Secondary | ICD-10-CM

## 2017-04-28 ENCOUNTER — Other Ambulatory Visit: Payer: Self-pay | Admitting: *Deleted

## 2017-04-28 MED ORDER — LIRAGLUTIDE 18 MG/3ML ~~LOC~~ SOPN: PEN_INJECTOR | SUBCUTANEOUS | 2 refills | Status: DC

## 2017-05-02 ENCOUNTER — Other Ambulatory Visit: Payer: Self-pay | Admitting: Physician Assistant

## 2017-05-02 DIAGNOSIS — F411 Generalized anxiety disorder: Secondary | ICD-10-CM

## 2017-05-08 ENCOUNTER — Telehealth: Payer: Self-pay

## 2017-05-08 NOTE — Telephone Encounter (Signed)
Ok to be sent.
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
## 2017-05-09 MED ORDER — LIRAGLUTIDE 18 MG/3ML ~~LOC~~ SOPN: PEN_INJECTOR | SUBCUTANEOUS | 2 refills | Status: DC

## 2017-05-09 NOTE — Telephone Encounter (Signed)
Rx sent, left VM to advise Pt.

## 2017-05-29 ENCOUNTER — Other Ambulatory Visit: Payer: Self-pay | Admitting: Physician Assistant

## 2017-05-29 DIAGNOSIS — I493 Ventricular premature depolarization: Secondary | ICD-10-CM

## 2017-06-04 ENCOUNTER — Other Ambulatory Visit: Payer: Self-pay | Admitting: Physician Assistant

## 2017-06-04 DIAGNOSIS — F411 Generalized anxiety disorder: Secondary | ICD-10-CM

## 2017-06-05 ENCOUNTER — Other Ambulatory Visit: Payer: Self-pay | Admitting: Physician Assistant

## 2017-06-05 NOTE — Telephone Encounter (Signed)
No additional notes

## 2017-06-06 ENCOUNTER — Ambulatory Visit: Payer: 59 | Admitting: Physician Assistant

## 2017-06-11 ENCOUNTER — Ambulatory Visit: Payer: 59

## 2017-07-31 ENCOUNTER — Other Ambulatory Visit: Payer: Self-pay | Admitting: Physician Assistant

## 2017-07-31 DIAGNOSIS — I493 Ventricular premature depolarization: Secondary | ICD-10-CM

## 2017-08-21 ENCOUNTER — Other Ambulatory Visit: Payer: Self-pay | Admitting: *Deleted

## 2017-08-21 MED ORDER — LIRAGLUTIDE 18 MG/3ML ~~LOC~~ SOPN: PEN_INJECTOR | SUBCUTANEOUS | 0 refills | Status: DC

## 2017-09-12 ENCOUNTER — Telehealth: Payer: Self-pay | Admitting: Physician Assistant

## 2017-09-12 ENCOUNTER — Ambulatory Visit (INDEPENDENT_AMBULATORY_CARE_PROVIDER_SITE_OTHER): Payer: 59 | Admitting: Physician Assistant

## 2017-09-12 ENCOUNTER — Encounter: Payer: Self-pay | Admitting: Physician Assistant

## 2017-09-12 VITALS — BP 128/83 | HR 89 | Ht 65.0 in | Wt 216.0 lb

## 2017-09-12 DIAGNOSIS — I493 Ventricular premature depolarization: Secondary | ICD-10-CM

## 2017-09-12 DIAGNOSIS — E782 Mixed hyperlipidemia: Secondary | ICD-10-CM

## 2017-09-12 DIAGNOSIS — E041 Nontoxic single thyroid nodule: Secondary | ICD-10-CM | POA: Diagnosis not present

## 2017-09-12 DIAGNOSIS — F411 Generalized anxiety disorder: Secondary | ICD-10-CM | POA: Diagnosis not present

## 2017-09-12 DIAGNOSIS — Z1231 Encounter for screening mammogram for malignant neoplasm of breast: Secondary | ICD-10-CM

## 2017-09-12 DIAGNOSIS — E559 Vitamin D deficiency, unspecified: Secondary | ICD-10-CM

## 2017-09-12 DIAGNOSIS — E8881 Metabolic syndrome: Secondary | ICD-10-CM | POA: Diagnosis not present

## 2017-09-12 MED ORDER — NEBIVOLOL HCL 5 MG PO TABS: 5.0000 mg | ORAL_TABLET | Freq: Every day | ORAL | 1 refills | Status: DC

## 2017-09-12 MED ORDER — BUSPIRONE HCL 7.5 MG PO TABS: 7.5000 mg | ORAL_TABLET | Freq: Two times a day (BID) | ORAL | 1 refills | Status: DC

## 2017-09-12 MED ORDER — SERTRALINE HCL 50 MG PO TABS: ORAL_TABLET | ORAL | 1 refills | Status: DC

## 2017-09-12 MED ORDER — SEMAGLUTIDE (1 MG/DOSE) 2 MG/1.5ML ~~LOC~~ SOPN: 1.0000 "application " | PEN_INJECTOR | SUBCUTANEOUS | 2 refills | Status: DC

## 2017-09-12 MED ORDER — ALPRAZOLAM 0.5 MG PO TABS: ORAL_TABLET | ORAL | 3 refills | Status: DC

## 2017-09-12 NOTE — Telephone Encounter (Signed)
Received fax for PA on Ozempic sent through cover my meds waiting on determination. - CF °

## 2017-09-14 ENCOUNTER — Encounter: Payer: Self-pay | Admitting: Physician Assistant

## 2017-09-14 DIAGNOSIS — E559 Vitamin D deficiency, unspecified: Secondary | ICD-10-CM | POA: Insufficient documentation

## 2017-09-14 NOTE — Progress Notes (Signed)
Subjective:    Patient ID: [name], female    DOB: May 02, 1971, 47 y.o.   MRN: [id number]  HPI Pt is a 47 yo female with PVC's, HTN, chronic fatigue, thyroid nodules who presents to the clinic for medication refills and medication change.   For insulin resistance she is currently taking metformin and victoza. She would like to replace victoza for ozempic. Denies any side effects. She continues to work on diet and exercise.   She requests thyroid u/s to follow up on thyroid nodules.   PVC's are well controlled on bystolic.   Anxiety is much better. No problems.   .. Active Ambulatory Problems    Diagnosis Date Noted  . Anxiety state 06/08/2007  . Essential hypertension 06/08/2007  . PALPITATIONS 04/26/2008  . ABDOMINAL PAIN RIGHT LOWER QUADRANT 11/04/2007  . LUMBAR STRAIN 06/10/2007  . Breast mass, right 01/19/2014  . Sinus tachycardia 01/26/2014  . Hyperlipidemia 01/31/2014  . PVC's (premature ventricular contractions) 05/31/2014  . Weight gain 09/19/2014  . Other fatigue 09/19/2014  . Dry skin 09/19/2014  . OSA (obstructive sleep apnea) 04/03/2015  . Dysfunctional uterine bleeding 06/28/2015  . Family history of breast cancer 12/12/2015  . Family history of melanoma 12/12/2015  . Redness of left eye 12/12/2015  . Right thyroid nodule 01/29/2016  . Uveitis, intermediate 02/29/2016  . Mass in neck 05/21/2016  . Chronic fatigue syndrome 09/17/2016  . Obesity (BMI 35.0-39.9 without comorbidity) 09/19/2016  . Drug-induced constipation 11/18/2016  . Dyslipidemia (high LDL; low HDL) 01/13/2017  . Vitamin D deficiency 09/14/2017   Resolved Ambulatory Problems    Diagnosis Date Noted  . No Resolved Ambulatory Problems   Past Medical History:  Diagnosis Date  . Anxiety   . Hyperlipidemia   . Hypertension   . Obesity (BMI 30-39.9)   . PVC's (premature ventricular contractions)       Review of Systems  All other systems reviewed and are negative.       Objective:   Physical Exam  Constitutional: She is oriented to person, place, and time. She appears well-developed and well-nourished.  HENT:  Head: Normocephalic and atraumatic.  Neck: No thyromegaly present.  Cardiovascular: Normal rate, regular rhythm and normal heart sounds.  Pulmonary/Chest: Effort normal and breath sounds normal.  Neurological: She is alert and oriented to person, place, and time.  Psychiatric: She has a normal mood and affect. Her behavior is normal.          Assessment & Plan:  Marland KitchenMarland KitchenDiagnoses and all orders for this visit:  Insulin resistance -     Semaglutide (OZEMPIC) 1 MG/DOSE SOPN; Inject 1 application into the skin once a week. -     COMPLETE METABOLIC PANEL WITH GFR -     Hemoglobin A1c  Anxiety state -     sertraline (ZOLOFT) 50 MG tablet; TAKE 1 TABLET (50 MG TOTAL) BY MOUTH DAILY. -     busPIRone (BUSPAR) 7.5 MG tablet; Take 1 tablet (7.5 mg total) by mouth 2 (two) times daily. -     ALPRAZolam (XANAX) 0.5 MG tablet; TAKE 1 TABLET BY MOUTH TWICE A DAY AS NEEDED FOR ANXIETY.  PVC's (premature ventricular contractions) -     nebivolol (BYSTOLIC) 5 MG tablet; Take 1 tablet (5 mg total) by mouth daily. -     B12  Right thyroid nodule -     US THYROID -     TSH  Mixed hyperlipidemia -     Lipid Panel w/reflex Direct  LDL  Vitamin D deficiency -     Vitamin D 1,25 dihydroxy  Visit for screening mammogram -     MM SCREENING BREAST TOMO BILATERAL   .[name] Depression screen [facility] 2/9 09/14/2017 04/11/2017  Decreased Interest 0 0  Down, Depressed, Hopeless 0 0  PHQ - 2 Score 0 0  Altered sleeping 0 -  Tired, decreased energy 0 -  Change in appetite 0 -  Feeling bad or failure about yourself  0 -  Trouble concentrating 0 -  Moving slowly or fidgety/restless 0 -  Suicidal thoughts 0 -  PHQ-9 Score 0 -  Difficult doing work/chores Not difficult at all -   .. GAD 7 : Generalized Anxiety Score 09/14/2017  Nervous, Anxious, on Edge 1  Control/stop  worrying 1  Worry too much - different things 1  Trouble relaxing 1  Restless 0  Easily annoyed or irritable 0  Afraid - awful might happen 0  Total GAD 7 Score 4  Anxiety Difficulty Not difficult at all    Refills ordered.   Made switch from victoza to ozempic. Follow up in 3 months.

## 2017-09-15 NOTE — Telephone Encounter (Signed)
Received fax from [employer] and they denied coverage on Ozempic Pen Injctr due to coverage is provided for the diagnosis of diabetes mellitus type 2. - CF

## 2017-09-15 NOTE — Telephone Encounter (Signed)
Call pt: let her know they will not cover ozempic for insulin resistance only DM type II. If I had even one a1c that was above 6.5 I would document DM but I do not.

## 2017-09-16 LAB — HEMOGLOBIN A1C
HEMOGLOBIN A1C: 5.4 %{Hb} (ref <5.7)
MEAN PLASMA GLUCOSE: 108 (calc)
eAG (mmol/L): 6 (calc)

## 2017-09-16 LAB — COMPLETE METABOLIC PANEL WITH GFR
AG Ratio: 1.7 (calc) (ref 1.0–2.5)
ALBUMIN MSPROF: 4.4 g/dL (ref 3.6–5.1)
ALT: 24 U/L (ref 6–29)
AST: 17 U/L (ref 10–35)
Alkaline phosphatase (APISO): 69 U/L (ref 33–115)
BUN: 11 mg/dL (ref 7–25)
CO2: 27 mmol/L (ref 20–32)
CREATININE: 0.67 mg/dL (ref 0.50–1.10)
Calcium: 9.8 mg/dL (ref 8.6–10.2)
Chloride: 107 mmol/L (ref 98–110)
GFR, Est African American: 120 mL/min/{1.73_m2} (ref >=60)
GFR, Est Non African American: 104 mL/min/{1.73_m2} (ref >=60)
GLUCOSE: 100 mg/dL — AB (ref 65–99)
Globulin: 2.6 g/dL (calc) (ref 1.9–3.7)
Potassium: 4.4 mmol/L (ref 3.5–5.3)
Sodium: 141 mmol/L (ref 135–146)
Total Bilirubin: 0.4 mg/dL (ref 0.2–1.2)
Total Protein: 7 g/dL (ref 6.1–8.1)

## 2017-09-16 LAB — VITAMIN D 1,25 DIHYDROXY
VITAMIN D 1, 25 (OH) TOTAL: 38 pg/mL (ref 18–72)
VITAMIN D3 1, 25 (OH): 38 pg/mL

## 2017-09-16 LAB — LIPID PANEL W/REFLEX DIRECT LDL
Cholesterol: 219 mg/dL — ABNORMAL HIGH (ref <200)
HDL: 55 mg/dL (ref >50)
LDL Cholesterol (Calc): 134 mg/dL (calc) — ABNORMAL HIGH
NON-HDL CHOLESTEROL (CALC): 164 mg/dL — AB (ref <130)
Total CHOL/HDL Ratio: 4 (calc) (ref <5.0)
Triglycerides: 167 mg/dL — ABNORMAL HIGH (ref <150)

## 2017-09-16 LAB — TSH: TSH: 2.24 m[IU]/L

## 2017-09-16 LAB — VITAMIN B12: Vitamin B-12: 317 pg/mL (ref 200–1100)

## 2017-09-19 ENCOUNTER — Ambulatory Visit (INDEPENDENT_AMBULATORY_CARE_PROVIDER_SITE_OTHER): Payer: 59

## 2017-09-19 DIAGNOSIS — Z1231 Encounter for screening mammogram for malignant neoplasm of breast: Secondary | ICD-10-CM | POA: Diagnosis not present

## 2017-09-19 DIAGNOSIS — E041 Nontoxic single thyroid nodule: Secondary | ICD-10-CM | POA: Diagnosis not present

## 2017-09-22 NOTE — Telephone Encounter (Signed)
LMOM notifying pt of information.

## 2017-10-29 ENCOUNTER — Other Ambulatory Visit: Payer: Self-pay | Admitting: Physician Assistant

## 2017-10-29 MED ORDER — ATORVASTATIN CALCIUM 40 MG PO TABS: 40.0000 mg | ORAL_TABLET | Freq: Every day | ORAL | 3 refills | Status: DC

## 2017-10-29 NOTE — Progress Notes (Signed)
Sent Lipitor to pharmacy due to elevated sugars and elevated LDL will start.

## 2017-12-13 ENCOUNTER — Other Ambulatory Visit: Payer: Self-pay | Admitting: Physician Assistant

## 2017-12-24 ENCOUNTER — Other Ambulatory Visit: Payer: Self-pay | Admitting: Physician Assistant

## 2017-12-24 DIAGNOSIS — F411 Generalized anxiety disorder: Secondary | ICD-10-CM

## 2018-01-06 ENCOUNTER — Other Ambulatory Visit: Payer: Self-pay | Admitting: Physician Assistant

## 2018-01-06 DIAGNOSIS — F411 Generalized anxiety disorder: Secondary | ICD-10-CM

## 2018-01-10 ENCOUNTER — Other Ambulatory Visit: Payer: Self-pay | Admitting: Physician Assistant

## 2018-02-06 ENCOUNTER — Telehealth: Payer: Self-pay

## 2018-02-06 MED ORDER — LIRAGLUTIDE 18 MG/3ML ~~LOC~~ SOPN: PEN_INJECTOR | SUBCUTANEOUS | 0 refills | Status: DC

## 2018-02-06 NOTE — Telephone Encounter (Signed)
Requested 1 pen of Victoza be sent to [facility] to hold pt over until her appt next week.  RX sent

## 2018-02-11 ENCOUNTER — Ambulatory Visit (INDEPENDENT_AMBULATORY_CARE_PROVIDER_SITE_OTHER): Payer: 59 | Admitting: Physician Assistant

## 2018-02-11 ENCOUNTER — Ambulatory Visit (INDEPENDENT_AMBULATORY_CARE_PROVIDER_SITE_OTHER): Payer: 59

## 2018-02-11 ENCOUNTER — Encounter: Payer: Self-pay | Admitting: Physician Assistant

## 2018-02-11 VITALS — BP 121/82 | HR 82 | Temp 98.8°F | Resp 16 | Ht 65.0 in | Wt 219.0 lb

## 2018-02-11 DIAGNOSIS — R102 Pelvic and perineal pain: Secondary | ICD-10-CM

## 2018-02-11 DIAGNOSIS — I493 Ventricular premature depolarization: Secondary | ICD-10-CM

## 2018-02-11 DIAGNOSIS — R1032 Left lower quadrant pain: Secondary | ICD-10-CM | POA: Diagnosis not present

## 2018-02-11 DIAGNOSIS — Z9189 Other specified personal risk factors, not elsewhere classified: Secondary | ICD-10-CM

## 2018-02-11 DIAGNOSIS — F411 Generalized anxiety disorder: Secondary | ICD-10-CM | POA: Diagnosis not present

## 2018-02-11 DIAGNOSIS — R1031 Right lower quadrant pain: Secondary | ICD-10-CM | POA: Diagnosis not present

## 2018-02-11 DIAGNOSIS — R7303 Prediabetes: Secondary | ICD-10-CM | POA: Diagnosis not present

## 2018-02-11 DIAGNOSIS — R197 Diarrhea, unspecified: Secondary | ICD-10-CM

## 2018-02-11 MED ORDER — LIRAGLUTIDE 18 MG/3ML ~~LOC~~ SOPN: PEN_INJECTOR | SUBCUTANEOUS | 0 refills | Status: DC

## 2018-02-11 MED ORDER — SERTRALINE HCL 50 MG PO TABS: ORAL_TABLET | ORAL | 1 refills | Status: DC

## 2018-02-11 MED ORDER — ALPRAZOLAM 0.5 MG PO TABS
ORAL_TABLET | ORAL | 3 refills | Status: DC
Start: 2018-02-11 — End: 2018-04-15

## 2018-02-11 MED ORDER — NEBIVOLOL HCL 5 MG PO TABS: 5.0000 mg | ORAL_TABLET | Freq: Every day | ORAL | 1 refills | Status: DC

## 2018-02-11 MED ORDER — BUSPIRONE HCL 7.5 MG PO TABS: 7.5000 mg | ORAL_TABLET | Freq: Two times a day (BID) | ORAL | 0 refills | Status: DC

## 2018-02-11 MED ORDER — METFORMIN HCL 500 MG PO TABS: 500.0000 mg | ORAL_TABLET | Freq: Two times a day (BID) | ORAL | 3 refills | Status: DC

## 2018-02-11 NOTE — Progress Notes (Signed)
Subjective:    Patient ID: [name], female    DOB: November 30, 1970, 47 y.o.   MRN: [id number]  HPI Pt is a 47 yo female with HTN, PVC's,   She has had 2-3 months of lower abdominal cramping that starts early in the morning and last a few hours but seems to get better when she gets up and moving. She denies any constipation but has had diarrhea. Denies any melena or hematochezia. No fever, chills. No problems or changes in urination. No abnormal vaginal bleeding. Per patient she is high risk for colon cancer. Her mother just recently passed away from pancreatic/breast cancer.   PVC's controlled on bystolic.   Her mood is controlled on zoloft, buspar and as needed xanax.   She is not checking her sugars but taking victoza and metformin. No hypoglycemic events. She never started lipitor. No open sores or wounds.    .. Family History  Problem Relation Age of Onset  . Hyperlipidemia Mother   . Hypertension Mother   . Cancer Mother        breast cancer invasive/pancreatic cancer  . Pancreatic cancer Mother   . Pulmonary fibrosis Mother   . Cancer Father   . Alcohol abuse Father   . Cancer Sister        melanoma  . Alcohol abuse Unknown        family hx  . Arthritis Unknown        family hx  . Hypertension Unknown        family hx  . Stroke Unknown        <50 family hx  . Colitis Unknown       Review of Systems    see HPI.  Objective:   Physical Exam  Constitutional: She is oriented to person, place, and time. She appears well-developed and well-nourished.  HENT:  Head: Normocephalic and atraumatic.  Neck: Normal range of motion. Neck supple. No thyromegaly present.  Cardiovascular: Normal rate and regular rhythm.  Pulmonary/Chest: Effort normal and breath sounds normal.  Abdominal: Soft. Bowel sounds are normal. She exhibits no distension and no mass. There is no tenderness. There is no rebound and no guarding.  Neurological: She is alert and oriented to  person, place, and time.  Psychiatric: She has a normal mood and affect. Her behavior is normal.          Assessment & Plan:  Marland KitchenMarland KitchenRanika was seen today for follow up and med refill and pelvic/back cramping at night.  Diagnoses and all orders for this visit:  Diarrhea, unspecified type -     US PELVIS (TRANSABDOMINAL ONLY) -     US PELVIS TRANSVANGINAL NON-OB (TV ONLY) -     Ambulatory referral to Gastroenterology  PVC's (premature ventricular contractions) -     nebivolol (BYSTOLIC) 5 MG tablet; Take 1 tablet (5 mg total) by mouth daily.  Anxiety state -     sertraline (ZOLOFT) 50 MG tablet; TAKE 1 TABLET (50 MG TOTAL) BY MOUTH DAILY. -     busPIRone (BUSPAR) 7.5 MG tablet; Take 1 tablet (7.5 mg total) by mouth 2 (two) times daily. Will need appt for further refills. -     ALPRAZolam (XANAX) 0.5 MG tablet; TAKE 1 TABLET BY MOUTH TWICE A DAY AS NEEDED FOR ANXIETY.  Colon cancer high risk -     Ambulatory referral to Gastroenterology  Pelvic pain -     US PELVIS (TRANSABDOMINAL ONLY) -     US  PELVIS TRANSVANGINAL NON-OB (TV ONLY) -     Ambulatory referral to Gastroenterology  Bilateral lower abdominal cramping -     US PELVIS (TRANSABDOMINAL ONLY) -     US PELVIS TRANSVANGINAL NON-OB (TV ONLY) -     Ambulatory referral to Gastroenterology  Prediabetes -     metFORMIN (GLUCOPHAGE) 500 MG tablet; Take 1 tablet (500 mg total) by mouth 2 (two) times daily with a meal. -     liraglutide (VICTOZA) 18 MG/3ML SOPN; INJECT 1.8MG  DAILY.  Other orders -     Discontinue: liraglutide (VICTOZA) 18 MG/3ML SOPN; INJECT 1.8MG  DAILY. Must schedule appointment before any further meds given  overall patient is doing well. Refilled medications.   Last a1c in feb and 5.4.  Stay on metformin and victoza.  Discussed benefits of starting a statin. Pt agrees to start.  BP controlled.   Unclear etiology of morning abdominal cramping. Consider food diary to look for triggers. Pt is concerned  due to her high risk for colon cancer and family hx of cancer. Will get pelvic u/s. Will make referral for colonoscopy. Follow up if persist or worsens.

## 2018-02-13 ENCOUNTER — Other Ambulatory Visit: Payer: 59

## 2018-02-13 DIAGNOSIS — R102 Pelvic and perineal pain: Secondary | ICD-10-CM | POA: Insufficient documentation

## 2018-02-13 DIAGNOSIS — Z9189 Other specified personal risk factors, not elsewhere classified: Secondary | ICD-10-CM | POA: Insufficient documentation

## 2018-02-13 DIAGNOSIS — R7303 Prediabetes: Secondary | ICD-10-CM | POA: Insufficient documentation

## 2018-02-13 MED ORDER — LIRAGLUTIDE 18 MG/3ML ~~LOC~~ SOPN: PEN_INJECTOR | SUBCUTANEOUS | 0 refills | Status: DC

## 2018-02-13 NOTE — Progress Notes (Signed)
Call pt: no acute abnormality. Right ovary not seen.

## 2018-02-25 LAB — HM COLONOSCOPY

## 2018-04-15 ENCOUNTER — Other Ambulatory Visit: Payer: Self-pay | Admitting: Physician Assistant

## 2018-04-15 DIAGNOSIS — F411 Generalized anxiety disorder: Secondary | ICD-10-CM

## 2018-04-16 ENCOUNTER — Encounter: Payer: Self-pay | Admitting: Physician Assistant

## 2018-04-21 ENCOUNTER — Other Ambulatory Visit: Payer: Self-pay | Admitting: Physician Assistant

## 2018-05-11 ENCOUNTER — Other Ambulatory Visit: Payer: Self-pay | Admitting: Physician Assistant

## 2018-05-11 DIAGNOSIS — F411 Generalized anxiety disorder: Secondary | ICD-10-CM

## 2018-06-05 ENCOUNTER — Other Ambulatory Visit: Payer: Self-pay | Admitting: Physician Assistant

## 2018-06-05 DIAGNOSIS — R7303 Prediabetes: Secondary | ICD-10-CM

## 2018-06-05 NOTE — Telephone Encounter (Signed)
Needs diabetic follow up. KG LPN

## 2018-06-18 ENCOUNTER — Ambulatory Visit (INDEPENDENT_AMBULATORY_CARE_PROVIDER_SITE_OTHER): Payer: 59 | Admitting: Physician Assistant

## 2018-06-18 VITALS — BP 108/73 | HR 83 | Wt 223.0 lb

## 2018-06-18 DIAGNOSIS — Z6837 Body mass index (BMI) 37.0-37.9, adult: Secondary | ICD-10-CM

## 2018-06-18 DIAGNOSIS — N760 Acute vaginitis: Secondary | ICD-10-CM

## 2018-06-18 DIAGNOSIS — E8881 Metabolic syndrome: Secondary | ICD-10-CM

## 2018-06-18 DIAGNOSIS — N898 Other specified noninflammatory disorders of vagina: Secondary | ICD-10-CM

## 2018-06-18 DIAGNOSIS — I493 Ventricular premature depolarization: Secondary | ICD-10-CM | POA: Diagnosis not present

## 2018-06-18 MED ORDER — METRONIDAZOLE 500 MG PO TABS: 500.0000 mg | ORAL_TABLET | Freq: Two times a day (BID) | ORAL | 0 refills | Status: AC

## 2018-06-18 MED ORDER — NEBIVOLOL HCL 5 MG PO TABS: 5.0000 mg | ORAL_TABLET | Freq: Every day | ORAL | 1 refills | Status: DC

## 2018-06-18 MED ORDER — SEMAGLUTIDE(0.25 OR 0.5MG/DOS) 2 MG/1.5ML ~~LOC~~ SOPN: 0.5000 mg | PEN_INJECTOR | SUBCUTANEOUS | 11 refills | Status: DC

## 2018-06-18 NOTE — Progress Notes (Signed)
HPI:                                                                [name] is a 47 y.o. female who presents to [place]: [facility] today for vaginal odor  Reports malodorous discharge for approx 1 week. Symptoms were preceded by a vacation and swimming/wearing a bathing suit. Denies chance of pregnancy (husband had vasectomy)  No concern for STI Has had BV before and feels like symptoms are similar  She is also requesting refills of Victoza and Nebivolol   Past Medical History:  Diagnosis Date  . Anxiety   . Hyperlipidemia   . Hypertension   . Obesity (BMI 30-39.9)   . PVC's (premature ventricular contractions)    Past Surgical History:  Procedure Laterality Date  . CHOLECYSTECTOMY     Social History   Tobacco Use  . Smoking status: Former Research scientist (life sciences)  . Smokeless tobacco: Never Used  . Tobacco comment: quit April 19, 2014  Substance Use Topics  . Alcohol use: Yes    Alcohol/week: 0.0 standard drinks    Comment: Occasional   family history includes Alcohol abuse in her father and unknown relative; Arthritis in her unknown relative; Cancer in her father, mother, and sister; Colitis in her unknown relative; Hyperlipidemia in her mother; Hypertension in her mother and unknown relative; Pancreatic cancer in her mother; Pulmonary fibrosis in her mother; Stroke in her unknown relative.    ROS: negative except as noted in the HPI  Medications: Current Outpatient Medications  Medication Sig Dispense Refill  . ALPRAZolam (XANAX) 0.5 MG tablet TAKE 1 TABLET BY MOUTH TWICE A DAY AS NEEDED 60 tablet 0  . AMBULATORY NON FORMULARY MEDICATION Progesterone 4% cream.  Apply 30mL to behind the knee at bedtime daily. 30 mL 6  . AMBULATORY NON FORMULARY MEDICATION Progesterone 50mg  SR capsules.  Take one cap by mouth daily at bedtime. 30 capsule 6  . atorvastatin (LIPITOR) 40 MG tablet TAKE 1 TABLET BY MOUTH EVERY DAY 90 tablet 01  .  busPIRone (BUSPAR) 7.5 MG tablet TAKE 1 TABLET (7.5 MG TOTAL) BY MOUTH 2 (TWO) TIMES DAILY. WILL NEED APPT FOR FURTHER REFILLS. 30 tablet 0  . Cholecalciferol (VITAMIN D PO) Take 5,000 Int'l Units by mouth daily.    . Cholecalciferol (VITAMIN D3) 5000 UNITS CAPS Take 1 capsule by mouth daily.    [name] liraglutide (VICTOZA) 18 MG/3ML SOPN INJECT 1.8 MG UNDER THE SKIN ONCE DAILY MUST MAKE APPOINTMENT. NO MORE REFILLS WILL BE GIVEN 3 pen 0  . metFORMIN (GLUCOPHAGE) 500 MG tablet Take 1 tablet (500 mg total) by mouth 2 (two) times daily with a meal. 180 tablet 3  . metroNIDAZOLE (FLAGYL) 500 MG tablet Take 1 tablet (500 mg total) by mouth 2 (two) times daily for 7 days. 14 tablet 0  . nebivolol (BYSTOLIC) 5 MG tablet Take 1 tablet (5 mg total) by mouth daily. 90 tablet 1  . sertraline (ZOLOFT) 50 MG tablet TAKE 1 TABLET (50 MG TOTAL) BY MOUTH DAILY. 90 tablet 1   No current facility-administered medications for this visit.    No Known Allergies     Objective:  Pulse 83   Wt 223 lb (101.2 kg)   LMP  05/15/2018   BMI 37.11 kg/m  Gen:  alert, not ill-appearing, no distress, appropriate for age 77: head normocephalic without obvious abnormality, conjunctiva and cornea clear, trachea midline Pulm: Normal work of breathing, normal phonation Neuro: alert and oriented x 3, no tremor MSK: extremities atraumatic, normal gait and station Skin: intact, no rashes on exposed skin, no jaundice, no cyanosis Psych: well-groomed, cooperative, good eye contact, euthymic mood, affect mood-congruent, speech is articulate, and thought processes clear and goal-directed    No results found for this or any previous visit (from the past 72 hour(s)). No results found.    Assessment and Plan: 47 y.o. female with   .[name] was seen today for vaginal odor.  Diagnoses and all orders for this visit:  Acute vaginitis -     metroNIDAZOLE (FLAGYL) 500 MG tablet; Take 1 tablet (500 mg total) by mouth 2 (two)  times daily for 7 days.  Vaginal odor -     metroNIDAZOLE (FLAGYL) 500 MG tablet; Take 1 tablet (500 mg total) by mouth 2 (two) times daily for 7 days.  PVC's (premature ventricular contractions) -     nebivolol (BYSTOLIC) 5 MG tablet; Take 1 tablet (5 mg total) by mouth daily.  Metabolic syndrome -     YSHUOHFGBMS,1.11 or 0.5MG /DOS, (OZEMPIC, 0.25 OR 0.5 MG/DOSE,) 2 MG/1.5ML SOPN; Inject 0.5 mg into the skin once a week.  Class 2 severe obesity due to excess calories with serious comorbidity and body mass index (BMI) of 37.0 to 37.9 in adult [facility])   Patient declined pelvic exam or self-swab today. Preferred to treat empirically for BV. Given low risk for STI, this is reasonable. Encouarged to f/u if symptoms worsen or do not improve  Refills provided as above. Switching from Victoza to Ozempic for once weekly injection. She was counseled that she will have to titrate to a therapeutic dose and may not see significant weight loss in the first month. Encouraged to f/u with PCP in 1 month.    Patient education and anticipatory guidance given Patient agrees with treatment plan Follow-up as needed if symptoms worsen or fail to improve  Darlyne Russian PA-C

## 2018-06-21 ENCOUNTER — Encounter: Payer: Self-pay | Admitting: Physician Assistant

## 2018-06-21 DIAGNOSIS — E88819 Insulin resistance, unspecified: Secondary | ICD-10-CM | POA: Insufficient documentation

## 2018-06-21 DIAGNOSIS — E8881 Metabolic syndrome: Secondary | ICD-10-CM | POA: Insufficient documentation

## 2018-06-27 ENCOUNTER — Other Ambulatory Visit: Payer: Self-pay | Admitting: Physician Assistant

## 2018-06-27 DIAGNOSIS — F411 Generalized anxiety disorder: Secondary | ICD-10-CM

## 2018-06-28 ENCOUNTER — Other Ambulatory Visit: Payer: Self-pay | Admitting: Physician Assistant

## 2018-06-28 DIAGNOSIS — R7303 Prediabetes: Secondary | ICD-10-CM

## 2018-07-27 ENCOUNTER — Other Ambulatory Visit: Payer: Self-pay | Admitting: Physician Assistant

## 2018-07-27 DIAGNOSIS — R7303 Prediabetes: Secondary | ICD-10-CM

## 2018-08-14 ENCOUNTER — Other Ambulatory Visit: Payer: Self-pay | Admitting: Physician Assistant

## 2018-08-14 DIAGNOSIS — F411 Generalized anxiety disorder: Secondary | ICD-10-CM

## 2018-08-14 NOTE — Telephone Encounter (Signed)
Requesting RF on Xanax  Last RX sent 04/15/18 for #60, no RF   Due for follow up   RX pended, please send if appropriate

## 2018-09-09 ENCOUNTER — Other Ambulatory Visit: Payer: Self-pay | Admitting: Physician Assistant

## 2018-09-09 DIAGNOSIS — F411 Generalized anxiety disorder: Secondary | ICD-10-CM

## 2018-09-09 NOTE — Telephone Encounter (Signed)
Needs appointment for further refills

## 2018-10-03 ENCOUNTER — Other Ambulatory Visit: Payer: Self-pay | Admitting: Physician Assistant

## 2018-10-03 DIAGNOSIS — F411 Generalized anxiety disorder: Secondary | ICD-10-CM

## 2018-10-04 ENCOUNTER — Other Ambulatory Visit: Payer: Self-pay | Admitting: Physician Assistant

## 2018-10-04 DIAGNOSIS — F411 Generalized anxiety disorder: Secondary | ICD-10-CM

## 2018-10-20 ENCOUNTER — Other Ambulatory Visit: Payer: Self-pay | Admitting: Physician Assistant

## 2018-10-20 DIAGNOSIS — Z1231 Encounter for screening mammogram for malignant neoplasm of breast: Secondary | ICD-10-CM

## 2018-10-22 ENCOUNTER — Other Ambulatory Visit: Payer: Self-pay

## 2018-10-22 ENCOUNTER — Ambulatory Visit (INDEPENDENT_AMBULATORY_CARE_PROVIDER_SITE_OTHER): Payer: 59

## 2018-10-22 DIAGNOSIS — Z1231 Encounter for screening mammogram for malignant neoplasm of breast: Secondary | ICD-10-CM | POA: Diagnosis not present

## 2018-10-23 ENCOUNTER — Ambulatory Visit (INDEPENDENT_AMBULATORY_CARE_PROVIDER_SITE_OTHER): Payer: 59

## 2018-10-23 ENCOUNTER — Other Ambulatory Visit: Payer: Self-pay | Admitting: Physician Assistant

## 2018-10-23 ENCOUNTER — Ambulatory Visit (INDEPENDENT_AMBULATORY_CARE_PROVIDER_SITE_OTHER): Payer: 59 | Admitting: Physician Assistant

## 2018-10-23 ENCOUNTER — Encounter: Payer: Self-pay | Admitting: Physician Assistant

## 2018-10-23 VITALS — BP 120/88 | HR 91 | Ht 65.0 in | Wt 221.0 lb

## 2018-10-23 DIAGNOSIS — E785 Hyperlipidemia, unspecified: Secondary | ICD-10-CM

## 2018-10-23 DIAGNOSIS — Z6836 Body mass index (BMI) 36.0-36.9, adult: Secondary | ICD-10-CM

## 2018-10-23 DIAGNOSIS — E6609 Other obesity due to excess calories: Secondary | ICD-10-CM

## 2018-10-23 DIAGNOSIS — E8881 Metabolic syndrome: Secondary | ICD-10-CM

## 2018-10-23 DIAGNOSIS — E041 Nontoxic single thyroid nodule: Secondary | ICD-10-CM

## 2018-10-23 DIAGNOSIS — I10 Essential (primary) hypertension: Secondary | ICD-10-CM | POA: Diagnosis not present

## 2018-10-23 DIAGNOSIS — F411 Generalized anxiety disorder: Secondary | ICD-10-CM

## 2018-10-23 DIAGNOSIS — F329 Major depressive disorder, single episode, unspecified: Secondary | ICD-10-CM

## 2018-10-23 DIAGNOSIS — R4589 Other symptoms and signs involving emotional state: Secondary | ICD-10-CM

## 2018-10-23 DIAGNOSIS — I493 Ventricular premature depolarization: Secondary | ICD-10-CM

## 2018-10-23 MED ORDER — SERTRALINE HCL 100 MG PO TABS: 100.0000 mg | ORAL_TABLET | Freq: Every day | ORAL | 3 refills | Status: DC

## 2018-10-23 MED ORDER — NEBIVOLOL HCL 5 MG PO TABS: 5.0000 mg | ORAL_TABLET | Freq: Every day | ORAL | 1 refills | Status: DC

## 2018-10-23 MED ORDER — SEMAGLUTIDE (1 MG/DOSE) 2 MG/1.5ML ~~LOC~~ SOPN: 1.0000 mg | PEN_INJECTOR | SUBCUTANEOUS | 2 refills | Status: DC

## 2018-10-23 MED ORDER — NALTREXONE-BUPROPION HCL ER 8-90 MG PO TB12: ORAL_TABLET | ORAL | 0 refills | Status: DC

## 2018-10-23 NOTE — Progress Notes (Signed)
Call pt: normal mammogram. Follow up in 1 year.

## 2018-10-23 NOTE — Patient Instructions (Signed)
b12 and vitamin d 1000mg /units daily. Start contrave.

## 2018-10-23 NOTE — Progress Notes (Signed)
Call pt: stable right thyroid nodule.

## 2018-10-23 NOTE — Progress Notes (Signed)
Subjective:    Patient ID: [name], female    DOB: Apr 18, 1971, 48 y.o.   MRN: [id number]  HPI  Pt is a 48 yo obese female with HTN, dyslipidemia, PVC's, metabolic syndrome who presents to the clinic for follow up.   She has been doing well on ozempic and lost some weight. She feels like she could increase dose. She is interested in contave as well. She is walking when weather permits. She finds it hard to control cravings.   She does feel like depression/mood not controlled. She is on a low dose of zoloft. Denies any sI/HC.   She has a right thyroid nodule. She reports due for her yearly follow up. No problems.   .. Active Ambulatory Problems    Diagnosis Date Noted  . Anxiety state 06/08/2007  . Essential hypertension 06/08/2007  . PALPITATIONS 04/26/2008  . Bilateral lower abdominal cramping 11/04/2007  . LUMBAR STRAIN 06/10/2007  . Breast mass, right 01/19/2014  . Sinus tachycardia 01/26/2014  . Hyperlipidemia 01/31/2014  . PVC's (premature ventricular contractions) 05/31/2014  . Weight gain 09/19/2014  . Other fatigue 09/19/2014  . Dry skin 09/19/2014  . OSA (obstructive sleep apnea) 04/03/2015  . Dysfunctional uterine bleeding 06/28/2015  . Family history of breast cancer 12/12/2015  . Family history of melanoma 12/12/2015  . Redness of left eye 12/12/2015  . Right thyroid nodule 01/29/2016  . Uveitis, intermediate 02/29/2016  . Mass in neck 05/21/2016  . Chronic fatigue syndrome 09/17/2016  . Obesity (BMI 35.0-39.9 without comorbidity) 09/19/2016  . Drug-induced constipation 11/18/2016  . Dyslipidemia (high LDL; low HDL) 01/13/2017  . Vitamin D deficiency 09/14/2017  . Prediabetes 02/13/2018  . Pelvic pain 02/13/2018  . Colon cancer high risk 02/13/2018  . Metabolic syndrome 76/54/6503  . Depressed mood 10/27/2018  . Class 2 obesity due to excess calories without serious comorbidity with body mass index (BMI) of 36.0 to 36.9 in adult 10/27/2018    Resolved Ambulatory Problems    Diagnosis Date Noted  . No Resolved Ambulatory Problems   Past Medical History:  Diagnosis Date  . Anxiety   . Hypertension   . Obesity (BMI 30-39.9)       Review of Systems See HPI>     Objective:   Physical Exam Vitals signs reviewed.  Constitutional:      Appearance: Normal appearance.  HENT:     Head: Normocephalic.  Cardiovascular:     Rate and Rhythm: Normal rate and regular rhythm.     Pulses: Normal pulses.  Pulmonary:     Effort: Pulmonary effort is normal.  Lymphadenopathy:     Cervical: No cervical adenopathy.  Neurological:     General: No focal deficit present.     Mental Status: She is alert and oriented to person, place, and time.  Psychiatric:        Mood and Affect: Mood normal.        Behavior: Behavior normal.           Assessment & Plan:  Marland KitchenMarland KitchenDiagnoses and all orders for this visit:  Essential hypertension -     COMPLETE METABOLIC PANEL WITH GFR  Dyslipidemia (high LDL; low HDL) -     Lipid Panel w/reflex Direct LDL  Metabolic syndrome -     COMPLETE METABOLIC PANEL WITH GFR -     Hemoglobin A1c -     Semaglutide, 1 MG/DOSE, (OZEMPIC, 1 MG/DOSE,) 2 MG/1.5ML SOPN; Inject 1 mg into the skin once a week.  Right thyroid nodule -     TSH -     US THYROID  PVC's (premature ventricular contractions) -     nebivolol (BYSTOLIC) 5 MG tablet; Take 1 tablet (5 mg total) by mouth daily.  Anxiety state -     sertraline (ZOLOFT) 100 MG tablet; Take 1 tablet (100 mg total) by mouth daily.  Class 2 obesity due to excess calories without serious comorbidity with body mass index (BMI) of 36.0 to 36.9 in adult -     Semaglutide, 1 MG/DOSE, (OZEMPIC, 1 MG/DOSE,) 2 MG/1.5ML SOPN; Inject 1 mg into the skin once a week. -     Naltrexone-buPROPion HCl ER 8-90 MG TB12; Take 1 tablet in am for 1 week, then increase to 1 tablet in am and 1 tablet in pm for 1 week, then increase to 2 tablets in am and 1 tablet in pm for 1  week, then increase to 2 tablets in am and 2 tablets in pm daily.  Depressed mood    Depression screen [facility] 2/9 10/23/2018 09/14/2017 04/11/2017  Decreased Interest 1 0 0  Down, Depressed, Hopeless 1 0 0  PHQ - 2 Score 2 0 0  Altered sleeping 3 0 -  Tired, decreased energy 2 0 -  Change in appetite 3 0 -  Feeling bad or failure about yourself  0 0 -  Trouble concentrating 0 0 -  Moving slowly or fidgety/restless 0 0 -  Suicidal thoughts 0 0 -  PHQ-9 Score 10 0 -  Difficult doing work/chores Somewhat difficult Not difficult at all -   .. GAD 7 : Generalized Anxiety Score 10/23/2018 09/14/2017  Nervous, Anxious, on Edge 0 1  Control/stop worrying 0 1  Worry too much - different things 0 1  Trouble relaxing 0 1  Restless 0 0  Easily annoyed or irritable 0 0  Afraid - awful might happen 0 0  Total GAD 7 Score 0 4  Anxiety Difficulty Not difficult at all Not difficult at all   Labs ordered today.   Increased zoloft to 100mg  daily. Follow up as needed in 3 months.   Increased ozempic to 1mg  weekly. Added contrave. Coupon card given.  [name].Discussed low carb diet with 1500 calories and 80g of protein.  Exercising at least 150 minutes a week.  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  Follow up in 1 month.   Start vitamin D and b12. Hx of low levels in the past.   Thyroid u/s ordered.

## 2018-10-24 LAB — COMPLETE METABOLIC PANEL WITH GFR
AG Ratio: 1.7 (calc) (ref 1.0–2.5)
ALKALINE PHOSPHATASE (APISO): 65 U/L (ref 31–125)
ALT: 21 U/L (ref 6–29)
AST: 18 U/L (ref 10–35)
Albumin: 4.5 g/dL (ref 3.6–5.1)
BILIRUBIN TOTAL: 0.3 mg/dL (ref 0.2–1.2)
BUN: 8 mg/dL (ref 7–25)
CHLORIDE: 103 mmol/L (ref 98–110)
CO2: 26 mmol/L (ref 20–32)
Calcium: 9.9 mg/dL (ref 8.6–10.2)
Creat: 0.63 mg/dL (ref 0.50–1.10)
GFR, Est African American: 122 mL/min/{1.73_m2} (ref >=60)
GFR, Est Non African American: 105 mL/min/{1.73_m2} (ref >=60)
GLUCOSE: 90 mg/dL (ref 65–99)
Globulin: 2.6 g/dL (calc) (ref 1.9–3.7)
Potassium: 4.8 mmol/L (ref 3.5–5.3)
Sodium: 139 mmol/L (ref 135–146)
Total Protein: 7.1 g/dL (ref 6.1–8.1)

## 2018-10-24 LAB — LIPID PANEL W/REFLEX DIRECT LDL
Cholesterol: 242 mg/dL — ABNORMAL HIGH (ref <200)
HDL: 58 mg/dL (ref >=50)
LDL CHOLESTEROL (CALC): 148 mg/dL — AB
NON-HDL CHOLESTEROL (CALC): 184 mg/dL — AB (ref <130)
TRIGLYCERIDES: 219 mg/dL — AB (ref <150)
Total CHOL/HDL Ratio: 4.2 (calc) (ref <5.0)

## 2018-10-24 LAB — HEMOGLOBIN A1C
EAG (MMOL/L): 6 (calc)
HEMOGLOBIN A1C: 5.4 %{Hb} (ref <5.7)
MEAN PLASMA GLUCOSE: 108 (calc)

## 2018-10-24 LAB — TSH: TSH: 2.45 mIU/L

## 2018-10-26 NOTE — Progress Notes (Signed)
I do see lipitor on list confirm if she is taking or not.

## 2018-10-26 NOTE — Progress Notes (Signed)
Call pt: kidney, liver, glucose look great.  TG have worsened to 219. LDL up a bit to 148.  I do think starting a statin could be benefical. What are your thoughs?

## 2018-10-27 ENCOUNTER — Encounter: Payer: Self-pay | Admitting: Physician Assistant

## 2018-10-27 ENCOUNTER — Other Ambulatory Visit: Payer: Self-pay | Admitting: Physician Assistant

## 2018-10-27 DIAGNOSIS — Z6836 Body mass index (BMI) 36.0-36.9, adult: Secondary | ICD-10-CM

## 2018-10-27 DIAGNOSIS — R4589 Other symptoms and signs involving emotional state: Secondary | ICD-10-CM | POA: Insufficient documentation

## 2018-10-27 DIAGNOSIS — F411 Generalized anxiety disorder: Secondary | ICD-10-CM

## 2018-10-27 DIAGNOSIS — E6609 Other obesity due to excess calories: Secondary | ICD-10-CM | POA: Insufficient documentation

## 2018-10-27 DIAGNOSIS — Z6834 Body mass index (BMI) 34.0-34.9, adult: Secondary | ICD-10-CM | POA: Insufficient documentation

## 2018-10-27 DIAGNOSIS — E669 Obesity, unspecified: Secondary | ICD-10-CM | POA: Insufficient documentation

## 2018-10-27 DIAGNOSIS — F329 Major depressive disorder, single episode, unspecified: Secondary | ICD-10-CM

## 2018-10-27 MED ORDER — ATORVASTATIN CALCIUM 40 MG PO TABS: 40.0000 mg | ORAL_TABLET | Freq: Every day | ORAL | Status: DC

## 2018-11-11 ENCOUNTER — Other Ambulatory Visit: Payer: Self-pay | Admitting: Physician Assistant

## 2018-11-11 DIAGNOSIS — E6609 Other obesity due to excess calories: Secondary | ICD-10-CM

## 2018-11-11 DIAGNOSIS — Z6836 Body mass index (BMI) 36.0-36.9, adult: Principal | ICD-10-CM

## 2018-11-11 NOTE — Telephone Encounter (Signed)
Notified patient of needing appointment and transferred her to scheduling. KG LPN

## 2018-11-11 NOTE — Telephone Encounter (Signed)
Schedule for Friday.

## 2018-11-11 NOTE — Telephone Encounter (Signed)
Needs virtual follow up. Please have weight and vitals ready. I want to see if tolerating and losing weight.

## 2018-11-12 NOTE — Telephone Encounter (Signed)
Pt scheduled for 11/18/18

## 2018-11-18 ENCOUNTER — Other Ambulatory Visit: Payer: Self-pay

## 2018-11-18 ENCOUNTER — Other Ambulatory Visit: Payer: Self-pay | Admitting: Physician Assistant

## 2018-11-18 ENCOUNTER — Telehealth (INDEPENDENT_AMBULATORY_CARE_PROVIDER_SITE_OTHER): Payer: 59 | Admitting: Physician Assistant

## 2018-11-18 ENCOUNTER — Encounter: Payer: Self-pay | Admitting: Physician Assistant

## 2018-11-18 VITALS — BP 125/90 | HR 94 | Temp 97.0°F | Wt 217.0 lb

## 2018-11-18 DIAGNOSIS — J329 Chronic sinusitis, unspecified: Secondary | ICD-10-CM | POA: Diagnosis not present

## 2018-11-18 DIAGNOSIS — F411 Generalized anxiety disorder: Secondary | ICD-10-CM

## 2018-11-18 DIAGNOSIS — J40 Bronchitis, not specified as acute or chronic: Secondary | ICD-10-CM

## 2018-11-18 DIAGNOSIS — Z6836 Body mass index (BMI) 36.0-36.9, adult: Secondary | ICD-10-CM

## 2018-11-18 DIAGNOSIS — E8881 Metabolic syndrome: Secondary | ICD-10-CM | POA: Diagnosis not present

## 2018-11-18 DIAGNOSIS — E6609 Other obesity due to excess calories: Secondary | ICD-10-CM | POA: Diagnosis not present

## 2018-11-18 MED ORDER — BENZONATATE 200 MG PO CAPS: 200.0000 mg | ORAL_CAPSULE | Freq: Two times a day (BID) | ORAL | 0 refills | Status: DC | PRN

## 2018-11-18 MED ORDER — AZITHROMYCIN 250 MG PO TABS: ORAL_TABLET | ORAL | 0 refills | Status: DC

## 2018-11-18 MED ORDER — ALPRAZOLAM 0.5 MG PO TABS: ORAL_TABLET | ORAL | 5 refills | Status: DC

## 2018-11-18 MED ORDER — SEMAGLUTIDE (1 MG/DOSE) 2 MG/1.5ML ~~LOC~~ SOPN: 1.0000 mg | PEN_INJECTOR | SUBCUTANEOUS | 2 refills | Status: DC

## 2018-11-18 MED ORDER — NALTREXONE-BUPROPION HCL ER 8-90 MG PO TB12: ORAL_TABLET | ORAL | 2 refills | Status: DC

## 2018-11-18 MED ORDER — ALBUTEROL SULFATE HFA 108 (90 BASE) MCG/ACT IN AERS: 2.0000 | INHALATION_SPRAY | Freq: Four times a day (QID) | RESPIRATORY_TRACT | 0 refills | Status: DC | PRN

## 2018-11-18 NOTE — Progress Notes (Signed)
-  Weight (on 3/13 was 221 lbs) DOESN'T HAVE A SCALE AND CANT WEIGH?!  -Has dry cough (but says she is coughing up brown stuff), sore throat, worse at night, some body aches, headaches, no fever

## 2018-11-18 NOTE — Progress Notes (Signed)
Subjective:    Patient ID: [name], female    DOB: 10-11-1970, 48 y.o.   MRN: [id number]  HPI   .Marland KitchenVirtual Visit via Video Note  I connected with [name] on 11/22/18 at 10:30 AM EDT by a video enabled telemedicine application and verified that I am speaking with the correct person using two identifiers.   I discussed the limitations of evaluation and management by telemedicine and the availability of in person appointments. The patient expressed understanding and agreed to proceed.  History of Present Illness: Pt is a 48 yo obese female with HTN, PVC's, metabolic syndrome and anxiety, chronic fatigue syndrome who presents to the clinic to follow up on medications.   Pt is on contrave and ozempic for weight and metabolic syndrome. She is down from 221 to 217. She has not been able to titrate up to the 2 tablets twice a day yet due to side effects of fatigue. She plans on titrating as tolerated. She is walking more. She is focusing more on diet.   Anxiety ok. In [place] pandemic so more stressful times. Needs refill.   She has had cough, sinus pressure, ear popping for 3 days. She has taken nothing OTC. Cough is keeping her up at night. No fever or SOB. She does have some minimal chest tightness. She is working from home. No other known sick contacts.   .. Active Ambulatory Problems    Diagnosis Date Noted  . Anxiety state 06/08/2007  . Essential hypertension 06/08/2007  . PALPITATIONS 04/26/2008  . Bilateral lower abdominal cramping 11/04/2007  . LUMBAR STRAIN 06/10/2007  . Breast mass, right 01/19/2014  . Sinus tachycardia 01/26/2014  . Hyperlipidemia 01/31/2014  . PVC's (premature ventricular contractions) 05/31/2014  . Weight gain 09/19/2014  . Other fatigue 09/19/2014  . Dry skin 09/19/2014  . OSA (obstructive sleep apnea) 04/03/2015  . Dysfunctional uterine bleeding 06/28/2015  . Family history of breast cancer 12/12/2015  . Family history of melanoma  12/12/2015  . Redness of left eye 12/12/2015  . Right thyroid nodule 01/29/2016  . Uveitis, intermediate 02/29/2016  . Mass in neck 05/21/2016  . Chronic fatigue syndrome 09/17/2016  . Obesity (BMI 35.0-39.9 without comorbidity) 09/19/2016  . Drug-induced constipation 11/18/2016  . Dyslipidemia (high LDL; low HDL) 01/13/2017  . Vitamin D deficiency 09/14/2017  . Prediabetes 02/13/2018  . Pelvic pain 02/13/2018  . Colon cancer high risk 02/13/2018  . Metabolic syndrome 30/04/2329  . Depressed mood 10/27/2018  . Class 2 obesity due to excess calories without serious comorbidity with body mass index (BMI) of 36.0 to 36.9 in adult 10/27/2018   Resolved Ambulatory Problems    Diagnosis Date Noted  . No Resolved Ambulatory Problems   Past Medical History:  Diagnosis Date  . Anxiety   . Hypertension   . Obesity (BMI 30-39.9)       Review of Systems See HPI.     Objective:   Physical Exam Vitals signs reviewed.  Constitutional:      Appearance: Normal appearance.  HENT:     Head: Normocephalic.  Cardiovascular:     Rate and Rhythm: Normal rate.  Pulmonary:     Effort: Pulmonary effort is normal.  Neurological:     General: No focal deficit present.     Mental Status: She is alert.  Psychiatric:        Mood and Affect: Mood normal.           Assessment & Plan:  [name]  was seen today for weight check and medication refill.  Diagnoses and all orders for this visit:  Class 2 obesity due to excess calories without serious comorbidity with body mass index (BMI) of 36.0 to 36.9 in adult -     Semaglutide, 1 MG/DOSE, (OZEMPIC, 1 MG/DOSE,) 2 MG/1.5ML SOPN; Inject 1 mg into the skin once a week. -     Naltrexone-buPROPion HCl ER 8-90 MG TB12; Take 2 tablets in the morning and 2 tablets in the evening.  Anxiety state -     ALPRAZolam (XANAX) 0.5 MG tablet; TAKE 1 TABLET BY MOUTH TWICE A DAY AS NEEDED FOR ANXIETY  Metabolic syndrome -     Semaglutide, 1 MG/DOSE,  (OZEMPIC, 1 MG/DOSE,) 2 MG/1.5ML SOPN; Inject 1 mg into the skin once a week. -     Naltrexone-buPROPion HCl ER 8-90 MG TB12; Take 2 tablets in the morning and 2 tablets in the evening.  Sinobronchitis -     albuterol (PROVENTIL HFA;VENTOLIN HFA) 108 (90 Base) MCG/ACT inhaler; Inhale 2 puffs into the lungs every 6 (six) hours as needed for wheezing or shortness of breath. -     azithromycin (ZITHROMAX) 250 MG tablet; Take 2 tablets now and then one tablet for 4 days. -     benzonatate (TESSALON) 200 MG capsule; Take 1 capsule (200 mg total) by mouth 2 (two) times daily as needed for cough.  Refilled ozempic and contrave. Continue diet and exercise. Follow up in 3 months.   Low risk COVID. Sounds like sinus bronchitis. Albuterol sent with tessalon pearls. Add mucinex. Due to being [place] weekend. Sent zpak if not improving in the next 2-3 days. Quarantine for 3 days to see if worsen or develops any more pulmonary symptoms.   Refilled xanax.  [name].PDMP reviewed during this encounter.   Follow Up Instructions:    I discussed the assessment and treatment plan with the patient. The patient was provided an opportunity to ask questions and all were answered. The patient agreed with the plan and demonstrated an understanding of the instructions.   The patient was advised to call back or seek an in-person evaluation if the symptoms worsen or if the condition fails to improve as anticipated.  I provided 15 minutes of non-face-to-face time during this encounter.   Iran Planas, PA-C

## 2018-11-22 ENCOUNTER — Encounter: Payer: Self-pay | Admitting: Physician Assistant

## 2018-12-10 ENCOUNTER — Other Ambulatory Visit: Payer: Self-pay | Admitting: Physician Assistant

## 2018-12-10 DIAGNOSIS — F411 Generalized anxiety disorder: Secondary | ICD-10-CM

## 2019-01-26 ENCOUNTER — Other Ambulatory Visit: Payer: Self-pay | Admitting: Physician Assistant

## 2019-01-26 DIAGNOSIS — E8881 Metabolic syndrome: Secondary | ICD-10-CM

## 2019-01-26 DIAGNOSIS — E6609 Other obesity due to excess calories: Secondary | ICD-10-CM

## 2019-03-01 ENCOUNTER — Other Ambulatory Visit: Payer: Self-pay | Admitting: Physician Assistant

## 2019-03-01 DIAGNOSIS — R7303 Prediabetes: Secondary | ICD-10-CM

## 2019-03-02 LAB — HM COLONOSCOPY

## 2019-03-12 ENCOUNTER — Encounter: Payer: Self-pay | Admitting: Physician Assistant

## 2019-03-24 ENCOUNTER — Other Ambulatory Visit: Payer: Self-pay | Admitting: Physician Assistant

## 2019-03-24 DIAGNOSIS — E6609 Other obesity due to excess calories: Secondary | ICD-10-CM

## 2019-03-24 DIAGNOSIS — E8881 Metabolic syndrome: Secondary | ICD-10-CM

## 2019-04-05 ENCOUNTER — Other Ambulatory Visit: Payer: Self-pay | Admitting: Physician Assistant

## 2019-04-05 DIAGNOSIS — F411 Generalized anxiety disorder: Secondary | ICD-10-CM

## 2019-05-03 ENCOUNTER — Other Ambulatory Visit: Payer: Self-pay | Admitting: Physician Assistant

## 2019-05-03 DIAGNOSIS — I493 Ventricular premature depolarization: Secondary | ICD-10-CM

## 2019-05-29 ENCOUNTER — Other Ambulatory Visit: Payer: Self-pay | Admitting: Physician Assistant

## 2019-05-29 DIAGNOSIS — R7303 Prediabetes: Secondary | ICD-10-CM

## 2019-06-04 ENCOUNTER — Other Ambulatory Visit: Payer: Self-pay | Admitting: Physician Assistant

## 2019-06-04 DIAGNOSIS — I493 Ventricular premature depolarization: Secondary | ICD-10-CM

## 2019-06-04 DIAGNOSIS — E6609 Other obesity due to excess calories: Secondary | ICD-10-CM

## 2019-06-04 DIAGNOSIS — E8881 Metabolic syndrome: Secondary | ICD-10-CM

## 2019-06-07 MED ORDER — NEBIVOLOL HCL 5 MG PO TABS: 5.0000 mg | ORAL_TABLET | Freq: Every day | ORAL | 0 refills | Status: DC

## 2019-06-29 ENCOUNTER — Other Ambulatory Visit: Payer: Self-pay | Admitting: Physician Assistant

## 2019-06-29 DIAGNOSIS — I493 Ventricular premature depolarization: Secondary | ICD-10-CM

## 2019-07-27 ENCOUNTER — Other Ambulatory Visit: Payer: Self-pay | Admitting: Physician Assistant

## 2019-07-27 DIAGNOSIS — I493 Ventricular premature depolarization: Secondary | ICD-10-CM

## 2019-07-27 NOTE — Telephone Encounter (Signed)
Called and LM on VM to return call. Call back number provided. KG LPN

## 2019-07-27 NOTE — Telephone Encounter (Signed)
Needs appt

## 2019-07-27 NOTE — Telephone Encounter (Signed)
This was last filled on 09/12/17. Found in the history portion of med list. Not on current med list. Please advise.

## 2019-07-30 ENCOUNTER — Other Ambulatory Visit: Payer: Self-pay | Admitting: Physician Assistant

## 2019-07-30 ENCOUNTER — Ambulatory Visit: Payer: Managed Care, Other (non HMO) | Admitting: Physician Assistant

## 2019-07-30 DIAGNOSIS — E8881 Metabolic syndrome: Secondary | ICD-10-CM

## 2019-07-30 DIAGNOSIS — Z6836 Body mass index (BMI) 36.0-36.9, adult: Secondary | ICD-10-CM

## 2019-07-30 DIAGNOSIS — I493 Ventricular premature depolarization: Secondary | ICD-10-CM

## 2019-07-30 DIAGNOSIS — E6609 Other obesity due to excess calories: Secondary | ICD-10-CM

## 2019-07-30 MED ORDER — NEBIVOLOL HCL 5 MG PO TABS: ORAL_TABLET | ORAL | 0 refills | Status: DC

## 2019-07-30 NOTE — Addendum Note (Signed)
Addended byAnnamaria Helling on: 07/30/2019 02:06 PM   Modules accepted: Orders

## 2019-08-02 ENCOUNTER — Ambulatory Visit (INDEPENDENT_AMBULATORY_CARE_PROVIDER_SITE_OTHER): Payer: Managed Care, Other (non HMO) | Admitting: Physician Assistant

## 2019-08-02 VITALS — BP 126/72 | Temp 97.0°F | Ht 65.0 in | Wt 217.0 lb

## 2019-08-02 DIAGNOSIS — E8881 Metabolic syndrome: Secondary | ICD-10-CM

## 2019-08-02 DIAGNOSIS — Z6836 Body mass index (BMI) 36.0-36.9, adult: Secondary | ICD-10-CM | POA: Diagnosis not present

## 2019-08-02 DIAGNOSIS — E6609 Other obesity due to excess calories: Secondary | ICD-10-CM | POA: Diagnosis not present

## 2019-08-02 DIAGNOSIS — I493 Ventricular premature depolarization: Secondary | ICD-10-CM

## 2019-08-02 DIAGNOSIS — F411 Generalized anxiety disorder: Secondary | ICD-10-CM

## 2019-08-02 DIAGNOSIS — R7303 Prediabetes: Secondary | ICD-10-CM

## 2019-08-02 MED ORDER — SERTRALINE HCL 50 MG PO TABS: 50.0000 mg | ORAL_TABLET | Freq: Every day | ORAL | 1 refills | Status: DC

## 2019-08-02 MED ORDER — METFORMIN HCL 500 MG PO TABS: 500.0000 mg | ORAL_TABLET | Freq: Two times a day (BID) | ORAL | 1 refills | Status: DC

## 2019-08-02 MED ORDER — NEBIVOLOL HCL 5 MG PO TABS: ORAL_TABLET | ORAL | 1 refills | Status: DC

## 2019-08-02 MED ORDER — OZEMPIC (1 MG/DOSE) 2 MG/1.5ML ~~LOC~~ SOPN: 1.0000 mg | PEN_INJECTOR | SUBCUTANEOUS | 0 refills | Status: DC

## 2019-08-02 MED ORDER — ALPRAZOLAM 0.5 MG PO TABS: ORAL_TABLET | ORAL | 5 refills | Status: DC

## 2019-08-02 NOTE — Progress Notes (Signed)
Patient ID: [name], female   DOB: 09/14/1970, 48 y.o.   MRN: [id number] .Marland KitchenVirtual Visit via Video Note  I connected with [name] on 08/02/19 at 11:30 AM EST by a video enabled telemedicine application and verified that I am speaking with the correct person using two identifiers.  Location: Patient: home Provider: home   I discussed the limitations of evaluation and management by telemedicine and the availability of in person appointments. The patient expressed understanding and agreed to proceed.  History of Present Illness: Pt is a 48 yo with metabolic syndrome, obesity, PVCs, anxiety, pre diabetes who presents to the clinic for medication follow up.   She is doing well. She tolerates ozempic well but not noticed a lot of benefit with her weight. She is very disappointed with no major weight shifts. She has not been taking contrave. She is not having problems with her PVC. She does feel like her energy is low. She wonders if zoloft could be effecting her and making it hard to lose weight. She would like to decrease dose. She admits not exercising but is watching diet and portion sizes.   .. Active Ambulatory Problems    Diagnosis Date Noted  . Anxiety state 06/08/2007  . Essential hypertension 06/08/2007  . PALPITATIONS 04/26/2008  . Bilateral lower abdominal cramping 11/04/2007  . LUMBAR STRAIN 06/10/2007  . Breast mass, right 01/19/2014  . Sinus tachycardia 01/26/2014  . Hyperlipidemia 01/31/2014  . PVC's (premature ventricular contractions) 05/31/2014  . Weight gain 09/19/2014  . Other fatigue 09/19/2014  . Dry skin 09/19/2014  . OSA (obstructive sleep apnea) 04/03/2015  . Dysfunctional uterine bleeding 06/28/2015  . Family history of breast cancer 12/12/2015  . Family history of melanoma 12/12/2015  . Redness of left eye 12/12/2015  . Right thyroid nodule 01/29/2016  . Uveitis, intermediate 02/29/2016  . Mass in neck 05/21/2016  . Chronic fatigue  syndrome 09/17/2016  . Obesity (BMI 35.0-39.9 without comorbidity) 09/19/2016  . Drug-induced constipation 11/18/2016  . Dyslipidemia (high LDL; low HDL) 01/13/2017  . Vitamin D deficiency 09/14/2017  . Prediabetes 02/13/2018  . Pelvic pain 02/13/2018  . Colon cancer high risk 02/13/2018  . Metabolic syndrome [id number]  . Depressed mood 10/27/2018  . Class 2 obesity due to excess calories without serious comorbidity with body mass index (BMI) of 36.0 to 36.9 in adult 10/27/2018   Resolved Ambulatory Problems    Diagnosis Date Noted  . No Resolved Ambulatory Problems   Past Medical History:  Diagnosis Date  . Anxiety   . Hypertension   . Obesity (BMI 30-39.9)    Reviewed med, allergy, problem list.      Observations/Objective: No acute distress.  Normal mood and appearance.   .. Today's Vitals   08/02/19 0915  BP: 126/72  Temp: (!) 97 F (36.1 C)  TempSrc: Oral  Weight: 217 lb (98.4 kg)  Height: 5\' 5"  (1.651 m)   Body mass index is 36.11 kg/m.  .. Depression screen [facility] 2/9 08/02/2019 10/23/2018 09/14/2017 04/11/2017  Decreased Interest 0 1 0 0  Down, Depressed, Hopeless 0 1 0 0  PHQ - 2 Score 0 2 0 0  Altered sleeping - 3 0 -  Tired, decreased energy - 2 0 -  Change in appetite - 3 0 -  Feeling bad or failure about yourself  - 0 0 -  Trouble concentrating - 0 0 -  Moving slowly or fidgety/restless - 0 0 -  Suicidal thoughts - 0 0 -  PHQ-9 Score - 10 0 -  Difficult doing work/chores - Somewhat difficult Not difficult at all -   .. GAD 7 : Generalized Anxiety Score 08/02/2019 10/23/2018 09/14/2017  Nervous, Anxious, on Edge 0 0 1  Control/stop worrying 0 0 1  Worry too much - different things 0 0 1  Trouble relaxing 0 0 1  Restless 0 0 0  Easily annoyed or irritable 0 0 0  Afraid - awful might happen 0 0 0  Total GAD 7 Score 0 0 4  Anxiety Difficulty Not difficult at all Not difficult at all Not difficult at all       Assessment and Plan: Marland KitchenMarland KitchenLindsea  was seen today for medication refill.  Diagnoses and all orders for this visit:  Metabolic syndrome -     Semaglutide, 1 MG/DOSE, (OZEMPIC, 1 MG/DOSE,) 2 MG/1.5ML SOPN; Inject 1 mg into the skin once a week.  Anxiety state -     ALPRAZolam (XANAX) 0.5 MG tablet; TAKE 1 TABLET BY MOUTH TWICE A DAY AS NEEDED FOR ANXIETY -     sertraline (ZOLOFT) 50 MG tablet; Take 1 tablet (50 mg total) by mouth daily.  Class 2 obesity due to excess calories without serious comorbidity with body mass index (BMI) of 36.0 to 36.9 in adult -     Semaglutide, 1 MG/DOSE, (OZEMPIC, 1 MG/DOSE,) 2 MG/1.5ML SOPN; Inject 1 mg into the skin once a week.  PVC's (premature ventricular contractions) -     nebivolol (BYSTOLIC) 5 MG tablet; TAKE 1 TABLET BY MOUTH DAILY  Prediabetes -     metFORMIN (GLUCOPHAGE) 500 MG tablet; Take 1 tablet (500 mg total) by mouth 2 (two) times daily with a meal.   Refilled medication.  Decreased zoloft. Watch for any mood changes/anxiety/depression.  Continue working on diet and exercise.  At least 150 minutes a week.  Consider IF 16:8.  Restart contrave.  Follow up in 3 months.    Follow Up Instructions:    I discussed the assessment and treatment plan with the patient. The patient was provided an opportunity to ask questions and all were answered. The patient agreed with the plan and demonstrated an understanding of the instructions.   The patient was advised to call back or seek an in-person evaluation if the symptoms worsen or if the condition fails to improve as anticipated.   Iran Planas, PA-C

## 2019-08-02 NOTE — Progress Notes (Signed)
Needs refill on Xanax.  Wants to decrease Sertraline back to 50 mg, currently taking 100 mg. Feels like it makes her too tired. Moved to bedtime but still having fatigue.  PHQ9-GAD7 completed.

## 2019-08-03 ENCOUNTER — Encounter: Payer: Self-pay | Admitting: Physician Assistant

## 2019-08-10 NOTE — Telephone Encounter (Signed)
She had appt on 12/21 and sent 90 day prescriptions.

## 2019-09-27 ENCOUNTER — Other Ambulatory Visit: Payer: Self-pay | Admitting: Neurology

## 2019-09-27 DIAGNOSIS — I493 Ventricular premature depolarization: Secondary | ICD-10-CM

## 2019-09-27 DIAGNOSIS — R7303 Prediabetes: Secondary | ICD-10-CM

## 2019-09-27 DIAGNOSIS — F411 Generalized anxiety disorder: Secondary | ICD-10-CM

## 2019-09-27 MED ORDER — SERTRALINE HCL 50 MG PO TABS: 50.0000 mg | ORAL_TABLET | Freq: Every day | ORAL | 1 refills | Status: DC

## 2019-09-27 MED ORDER — METFORMIN HCL 500 MG PO TABS: 500.0000 mg | ORAL_TABLET | Freq: Two times a day (BID) | ORAL | 1 refills | Status: DC

## 2019-09-27 MED ORDER — NEBIVOLOL HCL 5 MG PO TABS: ORAL_TABLET | ORAL | 1 refills | Status: DC

## 2019-09-27 MED ORDER — ATORVASTATIN CALCIUM 40 MG PO TABS: 40.0000 mg | ORAL_TABLET | Freq: Every day | ORAL | 1 refills | Status: DC

## 2019-09-29 ENCOUNTER — Telehealth (INDEPENDENT_AMBULATORY_CARE_PROVIDER_SITE_OTHER): Payer: No Typology Code available for payment source | Admitting: Physician Assistant

## 2019-09-29 VITALS — Temp 97.5°F | Ht 65.0 in | Wt 217.0 lb

## 2019-09-29 DIAGNOSIS — E6609 Other obesity due to excess calories: Secondary | ICD-10-CM

## 2019-09-29 DIAGNOSIS — Z13220 Encounter for screening for lipoid disorders: Secondary | ICD-10-CM

## 2019-09-29 DIAGNOSIS — E66812 Obesity, class 2: Secondary | ICD-10-CM

## 2019-09-29 DIAGNOSIS — Z6836 Body mass index (BMI) 36.0-36.9, adult: Secondary | ICD-10-CM

## 2019-09-29 DIAGNOSIS — Z1231 Encounter for screening mammogram for malignant neoplasm of breast: Secondary | ICD-10-CM | POA: Diagnosis not present

## 2019-09-29 DIAGNOSIS — M79672 Pain in left foot: Secondary | ICD-10-CM | POA: Insufficient documentation

## 2019-09-29 DIAGNOSIS — Z0000 Encounter for general adult medical examination without abnormal findings: Secondary | ICD-10-CM | POA: Diagnosis not present

## 2019-09-29 DIAGNOSIS — Z131 Encounter for screening for diabetes mellitus: Secondary | ICD-10-CM

## 2019-09-29 MED ORDER — DICLOFENAC SODIUM 1 % EX GEL: 4.0000 g | Freq: Four times a day (QID) | CUTANEOUS | 0 refills | Status: DC

## 2019-09-29 MED ORDER — TOPIRAMATE 25 MG PO TABS: ORAL_TABLET | ORAL | 2 refills | Status: DC

## 2019-09-29 NOTE — Progress Notes (Signed)
Patient ID: [name], female   DOB: [birth year], 49 y.o.   MRN: [id number] .Marland KitchenVirtual Visit via Video Note  I connected with [name] on 09/29/2019 at  2:40 PM EST by a video enabled telemedicine application and verified that I am speaking with the correct person using two identifiers.  Location: Patient: home Provider: clinic   I discussed the limitations of evaluation and management by telemedicine and the availability of in person appointments. The patient expressed understanding and agreed to proceed.  History of Present Illness: Pt is a 49 yo obese female with HTN, PVCs, OSA, MDD, Dyslipidemia who calls into the clinic for routine follow up and to discuss weight.   Her mood is doing well. No problems or concerns.   Her headaches have increased lately. They are not migraines but dull frontal lobe headache. She wakes up and its there. No light, sound sensitivity. No nausea or vomiting.   She has been working with Physiological scientist to lose weight. No real success. She feels stuck. She continues taking ozempic.   Her left ankle foot has been hurting for a while now. It has worsened with her exercise with personal trainer. Worse with extension. Pain is sharp and at times feels like it is going to give out. No known injury. No swelling.   .. Active Ambulatory Problems    Diagnosis Date Noted  . Anxiety state 06/08/2007  . Essential hypertension 06/08/2007  . PALPITATIONS 04/26/2008  . Bilateral lower abdominal cramping 11/04/2007  . LUMBAR STRAIN 06/10/2007  . Breast mass, right 01/19/2014  . Sinus tachycardia 01/26/2014  . Hyperlipidemia 01/31/2014  . PVC's (premature ventricular contractions) 05/31/2014  . Weight gain 09/19/2014  . Other fatigue 09/19/2014  . Dry skin 09/19/2014  . OSA (obstructive sleep apnea) 04/03/2015  . Dysfunctional uterine bleeding 06/28/2015  . Family history of breast cancer 12/12/2015  . Family history of melanoma 12/12/2015  .  Redness of left eye 12/12/2015  . Right thyroid nodule 01/29/2016  . Uveitis, intermediate 02/29/2016  . Mass in neck 05/21/2016  . Chronic fatigue syndrome 09/17/2016  . Obesity (BMI 35.0-39.9 without comorbidity) 09/19/2016  . Drug-induced constipation 11/18/2016  . Dyslipidemia (high LDL; low HDL) 01/13/2017  . Vitamin D deficiency 09/14/2017  . Prediabetes 02/13/2018  . Pelvic pain 02/13/2018  . Colon cancer high risk 02/13/2018  . Metabolic syndrome [id number]  . Depressed mood 10/27/2018  . Class 2 obesity due to excess calories without serious comorbidity with body mass index (BMI) of 36.0 to 36.9 in adult 10/27/2018  . Left foot pain 09/29/2019   Resolved Ambulatory Problems    Diagnosis Date Noted  . No Resolved Ambulatory Problems   Past Medical History:  Diagnosis Date  . Anxiety   . Hypertension   . Obesity (BMI 30-39.9)    Reviewed med, allergy, problem list.    Observations/Objective: No acute distress.  Normal mood and appearance.  Left ankle no visible swelling or redness.   .. Today's Vitals   09/29/19 1400  Temp: (!) 97.5 F (36.4 C)  TempSrc: Oral  Weight: 217 lb (98.4 kg)  Height: 5\' 5"  (1.651 m)   Body mass index is 36.11 kg/m.  .. Depression screen [facility] 2/9 09/29/2019 08/02/2019 10/23/2018 09/14/2017 04/11/2017  Decreased Interest 0 0 1 0 0  Down, Depressed, Hopeless 0 0 1 0 0  PHQ - 2 Score 0 0 2 0 0  Altered sleeping 1 - 3 0 -  Tired, decreased energy 3 - 2  0 -  Change in appetite 2 - 3 0 -  Feeling bad or failure about yourself  0 - 0 0 -  Trouble concentrating 0 - 0 0 -  Moving slowly or fidgety/restless 0 - 0 0 -  Suicidal thoughts 0 - 0 0 -  PHQ-9 Score 6 - 10 0 -  Difficult doing work/chores Not difficult at all - Somewhat difficult Not difficult at all -   .. GAD 7 : Generalized Anxiety Score 09/29/2019 08/02/2019 10/23/2018 09/14/2017  Nervous, Anxious, on Edge 0 0 0 1  Control/stop worrying 0 0 0 1  Worry too much - different  things 0 0 0 1  Trouble relaxing 1 0 0 1  Restless 1 0 0 0  Easily annoyed or irritable 1 0 0 0  Afraid - awful might happen 0 0 0 0  Total GAD 7 Score 3 0 0 4  Anxiety Difficulty Not difficult at all Not difficult at all Not difficult at all Not difficult at all       Assessment and Plan: Marland KitchenMarland KitchenNalijah was seen today for annual exam.  Diagnoses and all orders for this visit:  Preventative health care -     MM 3D SCREEN BREAST BILATERAL -     COMPLETE METABOLIC PANEL WITH GFR -     CBC with Differential/Platelet -     Lipid panel -     TSH -     Hemoglobin A1c  Class 2 obesity due to excess calories without serious comorbidity with body mass index (BMI) of 36.0 to 36.9 in adult -     COMPLETE METABOLIC PANEL WITH GFR -     TSH -     topiramate (TOPAMAX) 25 MG tablet; Take one tablet at bedtime for 7 days then increase to one tablet twice a day.  Left foot pain -     diclofenac Sodium (VOLTAREN) 1 % GEL; Apply 4 g topically 4 (four) times daily. To affected joint.  Visit for screening mammogram -     MM 3D SCREEN BREAST BILATERAL  Screening for lipid disorders -     Lipid panel  Screening for diabetes mellitus -     COMPLETE METABOLIC PANEL WITH GFR -     Hemoglobin A1c   .[name] Discussed 150 minutes of exercise a week.  Encouraged vitamin D 1000 units and Calcium 1300mg  or 4 servings of dairy a day.  Fasting labs ordered.  Colonoscopy UTD.  Pap UTD.  Mammogram scheduled for march.  Declined flu and TDAP.  Discussed need for shingrix.   [name].Discussed low carb diet with 1500 calories and 80g of protein.  Exercising at least 150 minutes a week.  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  Continue with personal trainer.  Start topamax. Discussed side effects.  Follow up in 2-3 months.   Left foot/anterior ankle pain- hard to access virtually. Sounds like pain over extensor tendons and could be extensor tendonitis but could even be a stress fracture due to her increase  in exercise. Wear good supportive shoe. voltaren to use as needed. Ice after exercise. Wear ankle brace with exercise. Start good stretches. Follow up in office with xray for further evaluation.     Follow Up Instructions:    I discussed the assessment and treatment plan with the patient. The patient was provided an opportunity to ask questions and all were answered. The patient agreed with the plan and demonstrated an understanding of the instructions.  The patient was advised to call back or seek an in-person evaluation if the symptoms worsen or if the condition fails to improve as anticipated.  I provided 21 minutes of non-face-to-face time during this encounter.   Iran Planas, PA-C

## 2019-09-29 NOTE — Progress Notes (Signed)
110/70 89  Wants to discuss adding Topamax for headaches/weight loss Having issue with left foot pain  Wants mgm/thyroid ultrasound/labs

## 2019-10-04 ENCOUNTER — Telehealth: Payer: Self-pay | Admitting: Physician Assistant

## 2019-10-04 ENCOUNTER — Encounter: Payer: Self-pay | Admitting: Physician Assistant

## 2019-10-04 NOTE — Telephone Encounter (Signed)
Received fax for PA on Topamax sent through cover my meds and received message stating the drug was covered on current benefit plan. - CF

## 2019-10-04 NOTE — Patient Instructions (Signed)
Health Maintenance, Female Adopting a healthy lifestyle and getting preventive care are important in promoting health and wellness. Ask your health care provider about:  The right schedule for you to have regular tests and exams.  Things you can do on your own to prevent diseases and keep yourself healthy. What should I know about diet, weight, and exercise? Eat a healthy diet   Eat a diet that includes plenty of vegetables, fruits, low-fat dairy products, and lean protein.  Do not eat a lot of foods that are high in solid fats, added sugars, or sodium. Maintain a healthy weight Body mass index (BMI) is used to identify weight problems. It estimates body fat based on height and weight. Your health care provider can help determine your BMI and help you achieve or maintain a healthy weight. Get regular exercise Get regular exercise. This is one of the most important things you can do for your health. Most adults should:  Exercise for at least 150 minutes each week. The exercise should increase your heart rate and make you sweat (moderate-intensity exercise).  Do strengthening exercises at least twice a week. This is in addition to the moderate-intensity exercise.  Spend less time sitting. Even light physical activity can be beneficial. Watch cholesterol and blood lipids Have your blood tested for lipids and cholesterol at 51 years of age, then have this test every 5 years. Have your cholesterol levels checked more often if:  Your lipid or cholesterol levels are high.  You are older than 51 years of age.  You are at high risk for heart disease. What should I know about cancer screening? Depending on your health history and family history, you may need to have cancer screening at various ages. This may include screening for:  Breast cancer.  Cervical cancer.  Colorectal cancer.  Skin cancer.  Lung cancer. What should I know about heart disease, diabetes, and high blood  pressure? Blood pressure and heart disease  High blood pressure causes heart disease and increases the risk of stroke. This is more likely to develop in people who have high blood pressure readings, are of African descent, or are overweight.  Have your blood pressure checked: ? Every 3-5 years if you are 18-39 years of age. ? Every year if you are 40 years old or older. Diabetes Have regular diabetes screenings. This checks your fasting blood sugar level. Have the screening done:  Once every three years after age 40 if you are at a normal weight and have a low risk for diabetes.  More often and at a younger age if you are overweight or have a high risk for diabetes. What should I know about preventing infection? Hepatitis B If you have a higher risk for hepatitis B, you should be screened for this virus. Talk with your health care provider to find out if you are at risk for hepatitis B infection. Hepatitis C Testing is recommended for:  Everyone born from 1945 through 1965.  Anyone with known risk factors for hepatitis C. Sexually transmitted infections (STIs)  Get screened for STIs, including gonorrhea and chlamydia, if: ? You are sexually active and are younger than 51 years of age. ? You are older than 51 years of age and your health care provider tells you that you are at risk for this type of infection. ? Your sexual activity has changed since you were last screened, and you are at increased risk for chlamydia or gonorrhea. Ask your health care provider if   you are at risk.  Ask your health care provider about whether you are at high risk for HIV. Your health care provider may recommend a prescription medicine to help prevent HIV infection. If you choose to take medicine to prevent HIV, you should first get tested for HIV. You should then be tested every 3 months for as long as you are taking the medicine. Pregnancy  If you are about to stop having your period (premenopausal) and  you may become pregnant, seek counseling before you get pregnant.  Take 400 to 800 micrograms (mcg) of folic acid every day if you become pregnant.  Ask for birth control (contraception) if you want to prevent pregnancy. Osteoporosis and menopause Osteoporosis is a disease in which the bones lose minerals and strength with aging. This can result in bone fractures. If you are 65 years old or older, or if you are at risk for osteoporosis and fractures, ask your health care provider if you should:  Be screened for bone loss.  Take a calcium or vitamin D supplement to lower your risk of fractures.  Be given hormone replacement therapy (HRT) to treat symptoms of menopause. Follow these instructions at home: Lifestyle  Do not use any products that contain nicotine or tobacco, such as cigarettes, e-cigarettes, and chewing tobacco. If you need help quitting, ask your health care provider.  Do not use street drugs.  Do not share needles.  Ask your health care provider for help if you need support or information about quitting drugs. Alcohol use  Do not drink alcohol if: ? Your health care provider tells you not to drink. ? You are pregnant, may be pregnant, or are planning to become pregnant.  If you drink alcohol: ? Limit how much you use to 0-1 drink a day. ? Limit intake if you are breastfeeding.  Be aware of how much alcohol is in your drink. In the [place] one drink equals one 12 oz bottle of beer (355 mL), one 5 oz glass of wine (148 mL), or one 1 oz glass of hard liquor (44 mL). General instructions  Schedule regular health, dental, and eye exams.  Stay current with your vaccines.  Tell your health care provider if: ? You often feel depressed. ? You have ever been abused or do not feel safe at home. Summary  Adopting a healthy lifestyle and getting preventive care are important in promoting health and wellness.  Follow your health care provider's instructions about healthy  diet, exercising, and getting tested or screened for diseases.  Follow your health care provider's instructions on monitoring your cholesterol and blood pressure. This information is not intended to replace advice given to you by your health care provider. Make sure you discuss any questions you have with your health care provider. Document Revised: 07/22/2018 Document Reviewed: 07/22/2018 Elsevier Patient Education  2020 Elsevier Inc.  

## 2019-10-06 ENCOUNTER — Telehealth: Payer: Self-pay | Admitting: Neurology

## 2019-10-06 DIAGNOSIS — M79672 Pain in left foot: Secondary | ICD-10-CM

## 2019-10-06 NOTE — Telephone Encounter (Signed)
X-ray ordered.

## 2019-10-06 NOTE — Telephone Encounter (Signed)
Diclofenac gel denied by insurance, patient made aware. She states she just paid out of pocket as it was only $20.   She states foot pain is worse. She would like to go ahead and get Xrays done. Can you place order?   From last note 09/29/2019: Left foot/anterior ankle pain- hard to access virtually. Sounds like pain over extensor tendons and could be extensor tendonitis but could even be a stress fracture due to her increase in exercise. Wear good supportive shoe. voltaren to use as needed. Ice after exercise. Wear ankle brace with exercise. Start good stretches. Follow up in office with xray for further evaluation.   She wants to come Friday and aware she can just walk into imaging downstairs.

## 2019-10-15 ENCOUNTER — Other Ambulatory Visit: Payer: Self-pay

## 2019-10-15 ENCOUNTER — Ambulatory Visit (INDEPENDENT_AMBULATORY_CARE_PROVIDER_SITE_OTHER): Payer: 59

## 2019-10-15 DIAGNOSIS — M79672 Pain in left foot: Secondary | ICD-10-CM

## 2019-10-16 LAB — CBC WITH DIFFERENTIAL/PLATELET
Absolute Monocytes: 645 cells/uL (ref 200–950)
Basophils Absolute: 74 cells/uL (ref 0–200)
Basophils Relative: 0.6 %
Eosinophils Absolute: 273 cells/uL (ref 15–500)
Eosinophils Relative: 2.2 %
HCT: 40.7 % (ref 35.0–45.0)
Hemoglobin: 14.1 g/dL (ref 11.7–15.5)
Lymphs Abs: 3274 cells/uL (ref 850–3900)
MCH: 32.4 pg (ref 27.0–33.0)
MCHC: 34.6 g/dL (ref 32.0–36.0)
MCV: 93.6 fL (ref 80.0–100.0)
MPV: 10.3 fL (ref 7.5–12.5)
Monocytes Relative: 5.2 %
Neutro Abs: 8134 cells/uL — ABNORMAL HIGH (ref 1500–7800)
Neutrophils Relative %: 65.6 %
Platelets: 342 10*3/uL (ref 140–400)
RBC: 4.35 10*6/uL (ref 3.80–5.10)
RDW: 12.6 % (ref 11.0–15.0)
Total Lymphocyte: 26.4 %
WBC: 12.4 10*3/uL — ABNORMAL HIGH (ref 3.8–10.8)

## 2019-10-16 LAB — COMPLETE METABOLIC PANEL WITH GFR
AG Ratio: 1.4 (calc) (ref 1.0–2.5)
ALT: 22 U/L (ref 6–29)
AST: 15 U/L (ref 10–35)
Albumin: 4 g/dL (ref 3.6–5.1)
Alkaline phosphatase (APISO): 61 U/L (ref 37–153)
BUN: 12 mg/dL (ref 7–25)
CO2: 25 mmol/L (ref 20–32)
Calcium: 9.6 mg/dL (ref 8.6–10.4)
Chloride: 103 mmol/L (ref 98–110)
Creat: 0.66 mg/dL (ref 0.50–1.05)
GFR, Est African American: 119 mL/min/{1.73_m2} (ref >=60)
GFR, Est Non African American: 103 mL/min/{1.73_m2} (ref >=60)
Globulin: 2.8 g/dL (calc) (ref 1.9–3.7)
Glucose, Bld: 85 mg/dL (ref 65–99)
Potassium: 4.9 mmol/L (ref 3.5–5.3)
Sodium: 138 mmol/L (ref 135–146)
Total Bilirubin: 0.5 mg/dL (ref 0.2–1.2)
Total Protein: 6.8 g/dL (ref 6.1–8.1)

## 2019-10-16 LAB — HEMOGLOBIN A1C
Hgb A1c MFr Bld: 5.4 % of total Hgb (ref <5.7)
Mean Plasma Glucose: 108 (calc)
eAG (mmol/L): 6 (calc)

## 2019-10-16 LAB — TSH: TSH: 2.34 mIU/L

## 2019-10-16 LAB — LIPID PANEL
Cholesterol: 188 mg/dL (ref <200)
HDL: 52 mg/dL (ref >=50)
LDL Cholesterol (Calc): 110 mg/dL (calc) — ABNORMAL HIGH
Non-HDL Cholesterol (Calc): 136 mg/dL (calc) — ABNORMAL HIGH (ref <130)
Total CHOL/HDL Ratio: 3.6 (calc) (ref <5.0)
Triglycerides: 146 mg/dL (ref <150)

## 2019-10-18 ENCOUNTER — Encounter: Payer: Self-pay | Admitting: Physician Assistant

## 2019-10-18 DIAGNOSIS — D72829 Elevated white blood cell count, unspecified: Secondary | ICD-10-CM

## 2019-10-18 DIAGNOSIS — Z0000 Encounter for general adult medical examination without abnormal findings: Secondary | ICD-10-CM

## 2019-10-18 NOTE — Progress Notes (Signed)
Money,   Kidney, liver, glucose look great.  Thyroid looks good.  A1C normal range.  Cholesterol looks MUCH better. LDL down from 148 to 110. Goal is to get under 100 LDL.  Your WBC is elevated with neurophils. Looks like viral or bacterial infection. How are you feeling?

## 2019-10-18 NOTE — Telephone Encounter (Signed)
[name] - Patient had this follow up question from lab results. Can you advise?

## 2019-10-18 NOTE — Telephone Encounter (Signed)
Fortino Sic of left foot is negative for any acute findings. You have a heel spur but does not correlate to your pain. I would come into office to see Dr. Darene Lamer sports medicine for him to do evaluation and see if any tendonitis etc.

## 2019-10-19 MED ORDER — AMOXICILLIN-POT CLAVULANATE 875-125 MG PO TABS: 1.0000 | ORAL_TABLET | Freq: Two times a day (BID) | ORAL | 0 refills | Status: DC

## 2019-10-19 NOTE — Addendum Note (Signed)
Addended byAnnamaria Helling on: 10/19/2019 04:01 PM   Modules accepted: Orders

## 2019-10-19 NOTE — Telephone Encounter (Signed)
Labs ordered.

## 2019-10-19 NOTE — Telephone Encounter (Signed)
Can you order cbc, vitamin D, b12 pending for her to come at end of the month?

## 2019-10-25 ENCOUNTER — Encounter: Payer: Self-pay | Admitting: Sports Medicine

## 2019-10-25 ENCOUNTER — Ambulatory Visit (INDEPENDENT_AMBULATORY_CARE_PROVIDER_SITE_OTHER): Payer: 59 | Admitting: Sports Medicine

## 2019-10-25 ENCOUNTER — Other Ambulatory Visit: Payer: Self-pay | Admitting: Physician Assistant

## 2019-10-25 ENCOUNTER — Other Ambulatory Visit: Payer: Self-pay

## 2019-10-25 DIAGNOSIS — M79672 Pain in left foot: Secondary | ICD-10-CM

## 2019-10-25 DIAGNOSIS — F411 Generalized anxiety disorder: Secondary | ICD-10-CM

## 2019-10-25 MED ORDER — MELOXICAM 15 MG PO TABS: ORAL_TABLET | ORAL | 3 refills | Status: DC

## 2019-10-25 NOTE — Progress Notes (Signed)
    Procedures performed today:    None.  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  Impression and Recommendations:    Left foot pain This is a very pleasant 49 year old female with chronic left foot pain, localized over the dorsal midfoot at the tarsometatarsal junction as well as over the dorsal shaft of the fourth metatarsal. She did start an exercise regimen and has been working out 5 days a week. 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 Because she has significant pain over the dorsum of the fourth metatarsal we are going to proceed with an MRI to evaluate for stress fracture. Also adding meloxicam, she will wear a postop shoe for 4 weeks. Return to see me afterwards.    ___________________________________________ [name]. Dianah Field, M.D., ABFM., CAQSM. Primary Care and [place] Instructor of [employer] [facility] [employer]

## 2019-10-25 NOTE — Assessment & Plan Note (Addendum)
This is a very pleasant 49 year old female with chronic left foot pain, localized over the dorsal midfoot at the tarsometatarsal junction as well as over the dorsal shaft of the fourth metatarsal. She did start an exercise regimen and has been working out 5 days a week. 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 Because she has significant pain over the dorsum of the fourth metatarsal we are going to proceed with an MRI to evaluate for stress fracture. Also adding meloxicam, she will wear a postop shoe for 4 weeks. Return to see me afterwards.

## 2019-10-27 ENCOUNTER — Other Ambulatory Visit: Payer: Self-pay

## 2019-10-27 ENCOUNTER — Ambulatory Visit (INDEPENDENT_AMBULATORY_CARE_PROVIDER_SITE_OTHER): Payer: 59

## 2019-10-27 DIAGNOSIS — Z0000 Encounter for general adult medical examination without abnormal findings: Secondary | ICD-10-CM | POA: Diagnosis not present

## 2019-10-27 DIAGNOSIS — Z1231 Encounter for screening mammogram for malignant neoplasm of breast: Secondary | ICD-10-CM

## 2019-10-29 NOTE — Progress Notes (Signed)
Normal mammogram. Follow up in 1 year.

## 2019-11-02 ENCOUNTER — Telehealth (INDEPENDENT_AMBULATORY_CARE_PROVIDER_SITE_OTHER): Payer: 59 | Admitting: Physician Assistant

## 2019-11-02 ENCOUNTER — Encounter: Payer: Self-pay | Admitting: Physician Assistant

## 2019-11-02 VITALS — BP 120/78 | HR 75 | Temp 98.6°F | Ht 65.0 in | Wt 217.0 lb

## 2019-11-02 DIAGNOSIS — U071 COVID-19: Secondary | ICD-10-CM | POA: Diagnosis not present

## 2019-11-02 MED ORDER — FLUTICASONE PROPIONATE 50 MCG/ACT NA SUSP: 2.0000 | Freq: Every day | NASAL | 6 refills | Status: DC

## 2019-11-02 NOTE — Progress Notes (Signed)
Patient ID: [name], female   DOB: 03/07/1971, 49 y.o.   MRN: [id number] .Marland KitchenVirtual Visit via Video Note  I connected with Ree Edman on 11/02/19 at  9:50 AM EDT by a video enabled telemedicine application and verified that I am speaking with the correct person using two identifiers.  Location: Patient: home Provider: clinic   I discussed the limitations of evaluation and management by telemedicine and the availability of in person appointments. The patient expressed understanding and agreed to proceed.  History of Present Illness: Patient is a 49 year old female who tested positive for Covid yesterday.  She has been feeling fatigued for a few days and when she lost her smell and taste she decided to get tested.  She just finished antibiotic for Augmentin for sinus infection.  She did not get significantly better after the antibiotic.  She has a lot of congestion, sinus pressure, watery eyes, fatigue, diarrhea, nausea.  She denies any fever, cough, sore throat, shortness of breath, wheezing.  Currently she is sleeping and eating mandrin oranges.  She has no major concerns today she just needs a note for work and any advice on how to continue to improve.    .. Active Ambulatory Problems    Diagnosis Date Noted  . Anxiety state 06/08/2007  . Essential hypertension 06/08/2007  . PALPITATIONS 04/26/2008  . Bilateral lower abdominal cramping 11/04/2007  . LUMBAR STRAIN 06/10/2007  . Breast mass, right 01/19/2014  . Sinus tachycardia 01/26/2014  . Hyperlipidemia 01/31/2014  . PVC's (premature ventricular contractions) 05/31/2014  . Weight gain 09/19/2014  . Other fatigue 09/19/2014  . Dry skin 09/19/2014  . OSA (obstructive sleep apnea) 04/03/2015  . Dysfunctional uterine bleeding 06/28/2015  . Family history of breast cancer 12/12/2015  . Family history of melanoma 12/12/2015  . Redness of left eye 12/12/2015  . Right thyroid nodule 01/29/2016  . Uveitis, intermediate  02/29/2016  . Mass in neck 05/21/2016  . Chronic fatigue syndrome 09/17/2016  . Obesity (BMI 35.0-39.9 without comorbidity) 09/19/2016  . Drug-induced constipation 11/18/2016  . Dyslipidemia (high LDL; low HDL) 01/13/2017  . Vitamin D deficiency 09/14/2017  . Prediabetes 02/13/2018  . Pelvic pain 02/13/2018  . Colon cancer high risk 02/13/2018  . Metabolic syndrome [id number]  . Depressed mood 10/27/2018  . Class 2 obesity due to excess calories without serious comorbidity with body mass index (BMI) of 36.0 to 36.9 in adult 10/27/2018  . Left foot pain 09/29/2019  . COVID-19 virus infection 11/02/2019   Resolved Ambulatory Problems    Diagnosis Date Noted  . No Resolved Ambulatory Problems   Past Medical History:  Diagnosis Date  . Anxiety   . Hypertension   . Obesity (BMI 30-39.9)    Reviewed med, allergy, problem list.     Observations/Objective: No acute distress. Normal breathing. No cough.  Appears weak and pale.   .. Today's Vitals   11/02/19 0927  BP: 120/78  Pulse: 75  Temp: 98.6 F (37 C)  TempSrc: Oral  Weight: 217 lb (98.4 kg)  Height: 5\' 5"  (1.651 m)   Body mass index is 36.11 kg/m.    Assessment and Plan: Marland KitchenMarland KitchenVi was seen today for nasal congestion.  Diagnoses and all orders for this visit:  COVID-19 virus infection -     fluticasone (FLONASE) 50 MCG/ACT nasal spray; Place 2 sprays into both nostrils daily.   Patient appears to be doing well today.  Discussed other signs and symptoms of Covid that could occur.  If  she were to have any worsening shortness of breath or problems breathing go to urgent care or emergency room.  She was certainly can call office for any further updates or suggestions.  For now would like for her to start vitamin D 1000 units, vitamin C 1000 units, zinc 100 mg, Flonase daily.  Encourage patient to continue to stay hydrated and eating.  Written to go back to work April 2nd note in EMR.   Follow Up Instructions:     I discussed the assessment and treatment plan with the patient. The patient was provided an opportunity to ask questions and all were answered. The patient agreed with the plan and demonstrated an understanding of the instructions.   The patient was advised to call back or seek an in-person evaluation if the symptoms worsen or if the condition fails to improve as anticipated.  I provided 10 minutes of non-face-to-face time during this encounter.   Iran Planas, PA-C

## 2019-11-02 NOTE — Progress Notes (Deleted)
Covid + yesterday No taste/smell Congestion/ sinus issues/ watery eyes Fatigue Diarrhea Nausea after eating  No fever, no cough, no sore throat

## 2019-11-15 ENCOUNTER — Other Ambulatory Visit: Payer: Self-pay | Admitting: Physician Assistant

## 2019-11-15 DIAGNOSIS — E8881 Metabolic syndrome: Secondary | ICD-10-CM

## 2019-11-15 DIAGNOSIS — E6609 Other obesity due to excess calories: Secondary | ICD-10-CM

## 2019-11-20 ENCOUNTER — Ambulatory Visit (INDEPENDENT_AMBULATORY_CARE_PROVIDER_SITE_OTHER): Payer: 59

## 2019-11-20 ENCOUNTER — Other Ambulatory Visit: Payer: Self-pay

## 2019-11-20 DIAGNOSIS — M79672 Pain in left foot: Secondary | ICD-10-CM | POA: Diagnosis not present

## 2019-11-22 ENCOUNTER — Other Ambulatory Visit: Payer: Self-pay

## 2019-11-22 ENCOUNTER — Ambulatory Visit (INDEPENDENT_AMBULATORY_CARE_PROVIDER_SITE_OTHER): Payer: 59 | Admitting: Sports Medicine

## 2019-11-22 DIAGNOSIS — M79672 Pain in left foot: Secondary | ICD-10-CM

## 2019-11-22 NOTE — Assessment & Plan Note (Signed)
This pleasant 49 year old female returns, she has chronic left foot pain, ultimately we obtained an MRI and placed her in a postop shoe as we had fear of a stress fracture. The MRI showed multiple pathologic findings including midfoot osteoarthritis, talonavicular ganglion cyst, and most importantly intermetatarsal bursitis in the third intermetatarsal space which is the likely symptomatic finding. She will continue her postop shoe for a solid month, she feels as though it is helping significantly and if insufficient improvement we will do a third intermetatarsal space injection.

## 2019-11-22 NOTE — Progress Notes (Signed)
    Procedures performed today:    None.  Independent interpretation of notes and tests performed by another provider:   I personally reviewed her foot MRI which shows midfoot osteoarthritis, talonavicular ganglion cyst and third intermetatarsal space bursitis.  Brief History, Exam, Impression, and Recommendations:    Left foot pain This pleasant 49 year old female returns, she has chronic left foot pain, ultimately we obtained an MRI and placed her in a postop shoe as we had fear of a stress fracture. The MRI showed multiple pathologic findings including midfoot osteoarthritis, talonavicular ganglion cyst, and most importantly intermetatarsal bursitis in the third intermetatarsal space which is the likely symptomatic finding. She will continue her postop shoe for a solid month, she feels as though it is helping significantly and if insufficient improvement we will do a third intermetatarsal space injection.    ___________________________________________ [name]. Dianah Field, M.D., ABFM., CAQSM. Primary Care and [place] Instructor of [employer] [facility] [employer]

## 2019-11-23 ENCOUNTER — Other Ambulatory Visit: Payer: Self-pay | Admitting: Neurology

## 2019-11-23 LAB — CBC WITH DIFFERENTIAL/PLATELET
Absolute Monocytes: 676 cells/uL (ref 200–950)
Basophils Absolute: 73 cells/uL (ref 0–200)
Basophils Relative: 0.7 %
Eosinophils Absolute: 208 cells/uL (ref 15–500)
Eosinophils Relative: 2 %
HCT: 41.8 % (ref 35.0–45.0)
Hemoglobin: 14.3 g/dL (ref 11.7–15.5)
Lymphs Abs: 3214 cells/uL (ref 850–3900)
MCH: 32.4 pg (ref 27.0–33.0)
MCHC: 34.2 g/dL (ref 32.0–36.0)
MCV: 94.6 fL (ref 80.0–100.0)
MPV: 10.6 fL (ref 7.5–12.5)
Monocytes Relative: 6.5 %
Neutro Abs: 6230 cells/uL (ref 1500–7800)
Neutrophils Relative %: 59.9 %
Platelets: 338 10*3/uL (ref 140–400)
RBC: 4.42 10*6/uL (ref 3.80–5.10)
RDW: 12.9 % (ref 11.0–15.0)
Total Lymphocyte: 30.9 %
WBC: 10.4 10*3/uL (ref 3.8–10.8)

## 2019-11-23 LAB — VITAMIN D 25 HYDROXY (VIT D DEFICIENCY, FRACTURES): Vit D, 25-Hydroxy: 45 ng/mL (ref 30–100)

## 2019-11-23 LAB — VITAMIN B12: Vitamin B-12: 337 pg/mL (ref 200–1100)

## 2019-11-23 NOTE — Telephone Encounter (Signed)
[name],   CBC improved. Hemoglobin great. Vitamin D MUCH better. Loney Domingo please add vitamin D and dosing to patients med list. Should at least be on 1000 units daily. B12 not much improvement from 2 years ago. Normal range but would like above 400. I would start b12 104mcg daily.

## 2019-11-26 LAB — HM PAP SMEAR: HM Pap smear: NEGATIVE

## 2019-12-08 ENCOUNTER — Other Ambulatory Visit: Payer: Self-pay | Admitting: Physician Assistant

## 2019-12-08 DIAGNOSIS — E6609 Other obesity due to excess calories: Secondary | ICD-10-CM

## 2019-12-08 DIAGNOSIS — Z6836 Body mass index (BMI) 36.0-36.9, adult: Secondary | ICD-10-CM

## 2019-12-09 ENCOUNTER — Other Ambulatory Visit: Payer: Self-pay | Admitting: Neurology

## 2019-12-09 DIAGNOSIS — R7303 Prediabetes: Secondary | ICD-10-CM

## 2019-12-09 DIAGNOSIS — E8881 Metabolic syndrome: Secondary | ICD-10-CM

## 2019-12-09 DIAGNOSIS — E6609 Other obesity due to excess calories: Secondary | ICD-10-CM

## 2019-12-09 DIAGNOSIS — F411 Generalized anxiety disorder: Secondary | ICD-10-CM

## 2019-12-09 MED ORDER — METFORMIN HCL 500 MG PO TABS: 500.0000 mg | ORAL_TABLET | Freq: Two times a day (BID) | ORAL | 1 refills | Status: DC

## 2019-12-09 MED ORDER — SERTRALINE HCL 50 MG PO TABS: 50.0000 mg | ORAL_TABLET | Freq: Every day | ORAL | 1 refills | Status: DC

## 2019-12-09 MED ORDER — ATORVASTATIN CALCIUM 40 MG PO TABS: 40.0000 mg | ORAL_TABLET | Freq: Every day | ORAL | 3 refills | Status: DC

## 2019-12-20 ENCOUNTER — Other Ambulatory Visit: Payer: Self-pay | Admitting: Neurology

## 2019-12-20 DIAGNOSIS — E6609 Other obesity due to excess calories: Secondary | ICD-10-CM

## 2019-12-20 DIAGNOSIS — Z6836 Body mass index (BMI) 36.0-36.9, adult: Secondary | ICD-10-CM

## 2019-12-20 DIAGNOSIS — E8881 Metabolic syndrome: Secondary | ICD-10-CM

## 2019-12-20 MED ORDER — OZEMPIC (1 MG/DOSE) 2 MG/1.5ML ~~LOC~~ SOPN: 1.0000 mg | PEN_INJECTOR | SUBCUTANEOUS | 0 refills | Status: DC

## 2019-12-22 ENCOUNTER — Other Ambulatory Visit: Payer: Self-pay | Admitting: Physician Assistant

## 2019-12-22 DIAGNOSIS — E6609 Other obesity due to excess calories: Secondary | ICD-10-CM

## 2020-01-09 ENCOUNTER — Other Ambulatory Visit: Payer: Self-pay | Admitting: Physician Assistant

## 2020-01-10 IMAGING — US US TRANSVAGINAL NON-OB
1 series · 13 of 25 positions shown · non-contrast
Comparison: Prior MRI from 05/10/2016.

CLINICAL DATA: Initial evaluation for pelvic pain, intermittent
abdominal cramping for several months.



[Series 1: us transvaginal non-ob · 0.22mm/px · 13 of 57 slices shown]
[im 1/57]
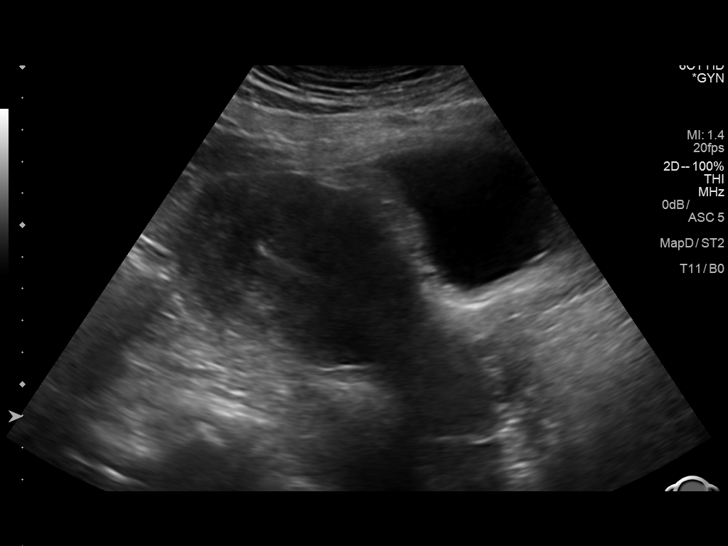
[im 5/57]
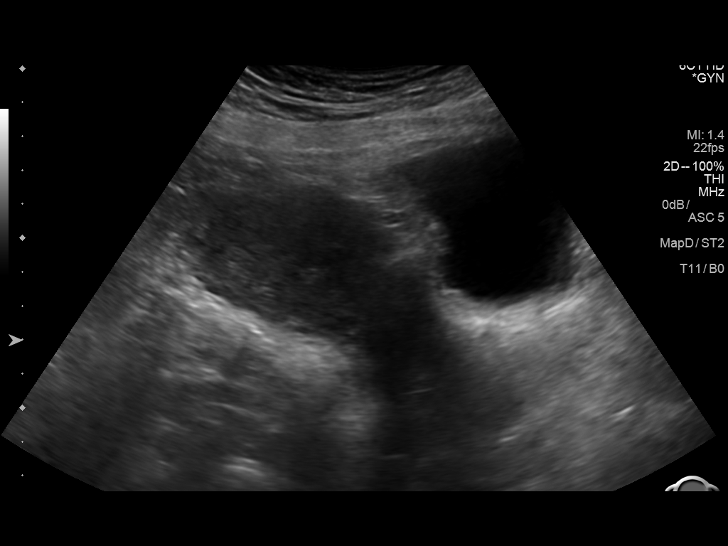
[im 10/57]
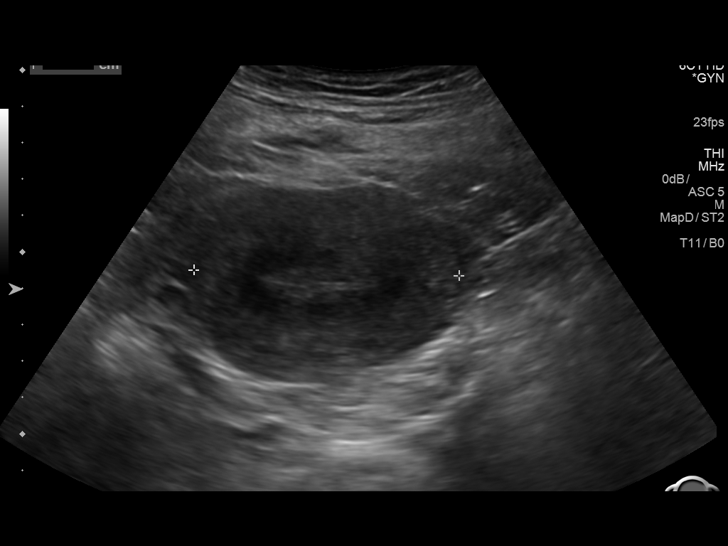
[im 15/57]
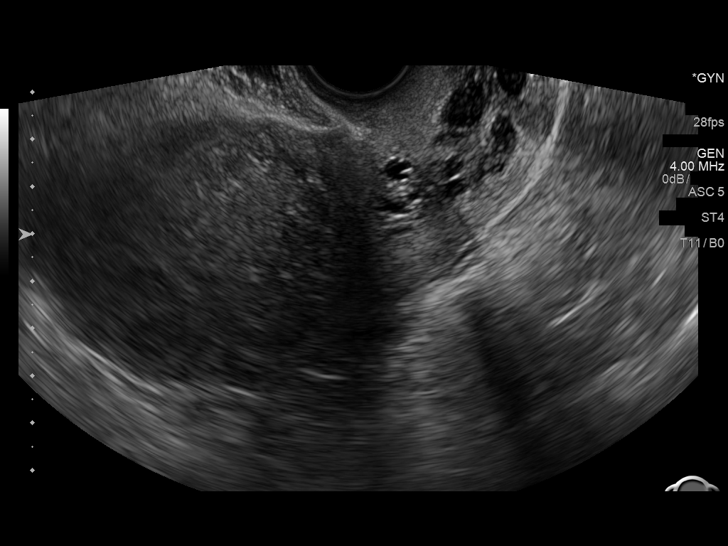
[im 19/57]
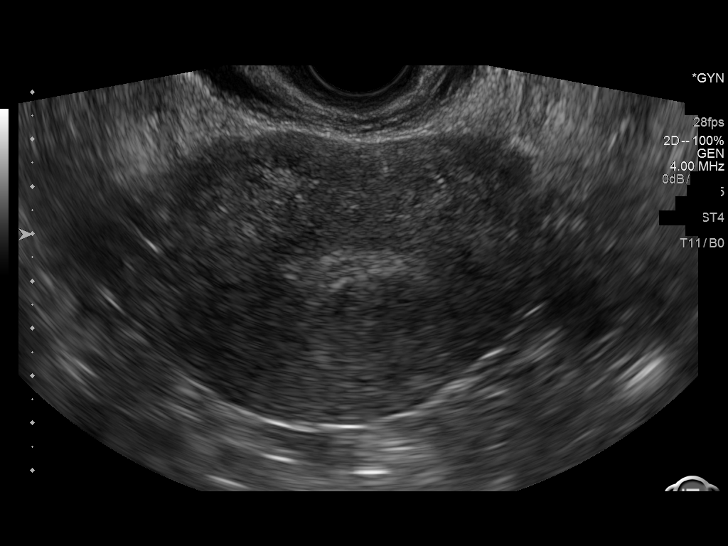
[im 24/57]
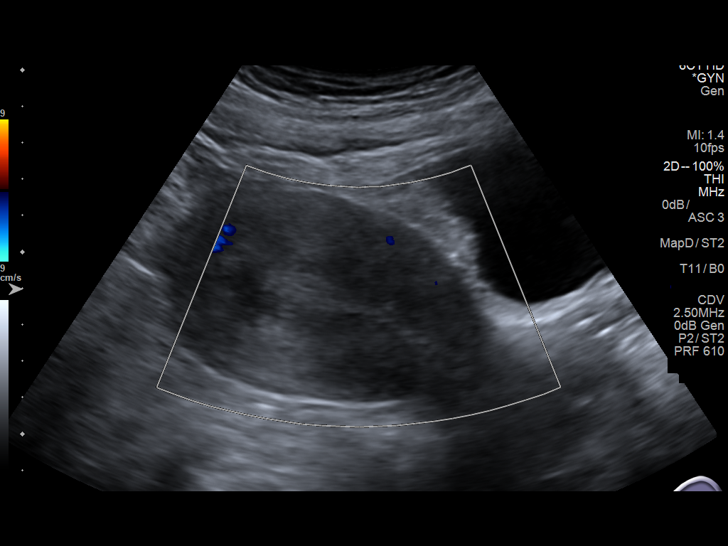
[im 29/57]
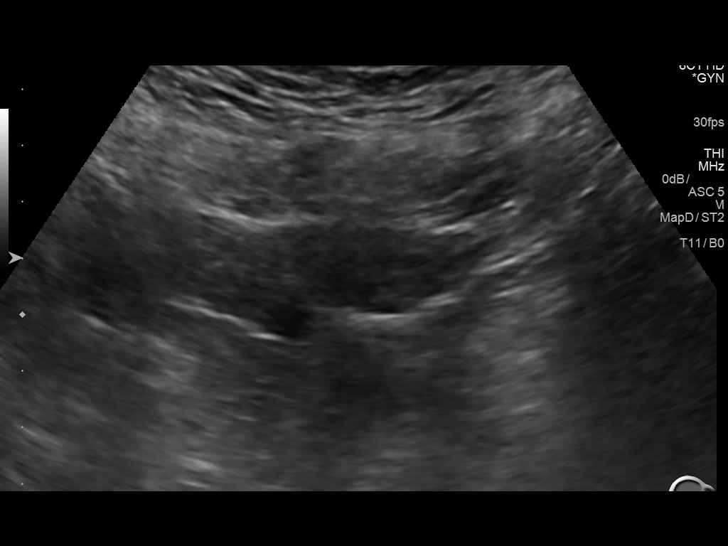
[im 33/57]
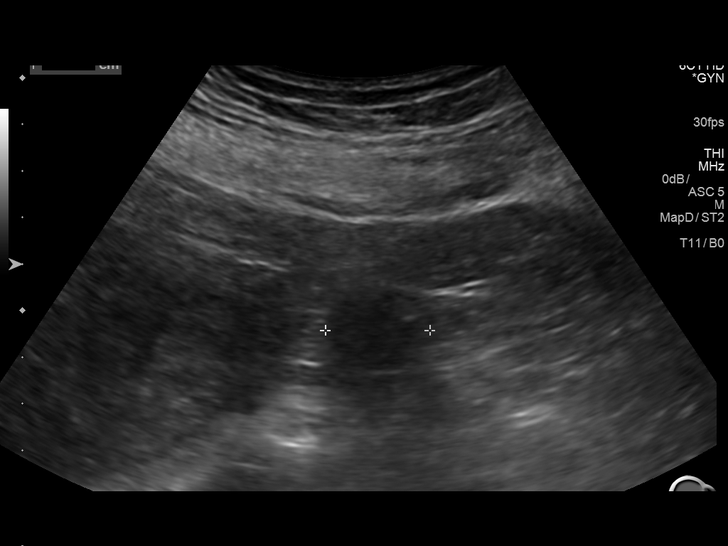
[im 38/57]
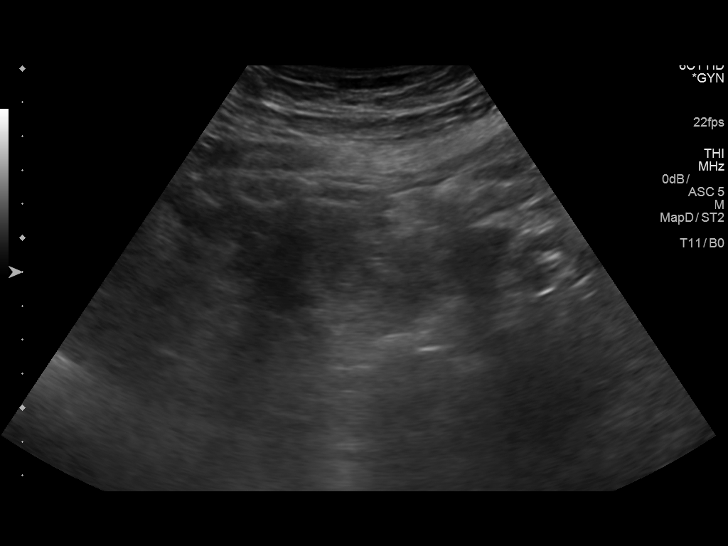
[im 43/57]
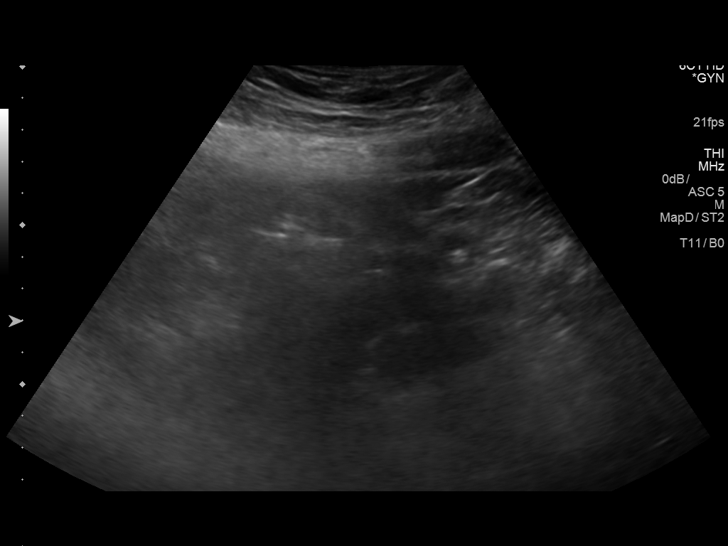
[im 47/57]
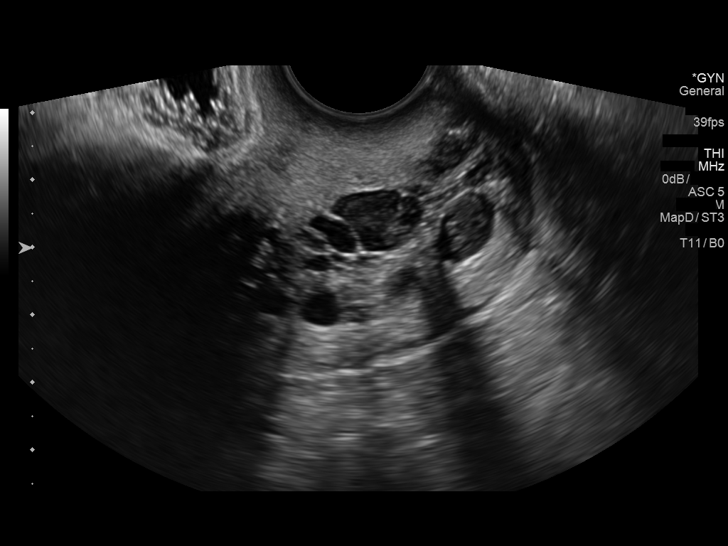
[im 52/57]
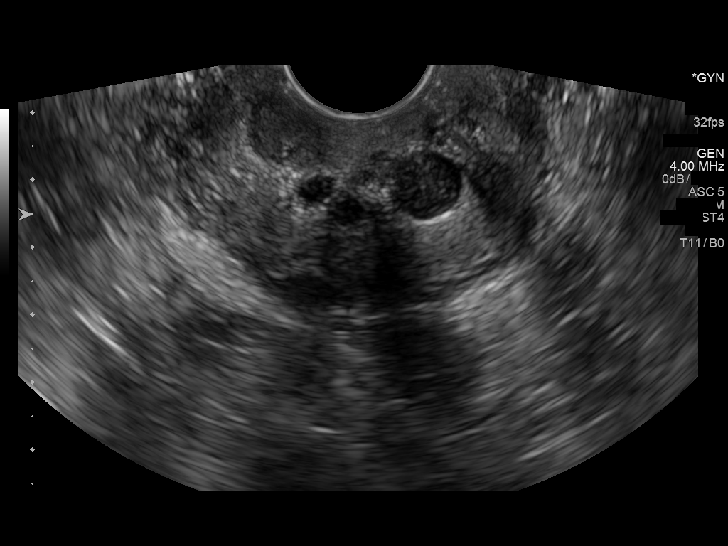
[im 57/57]
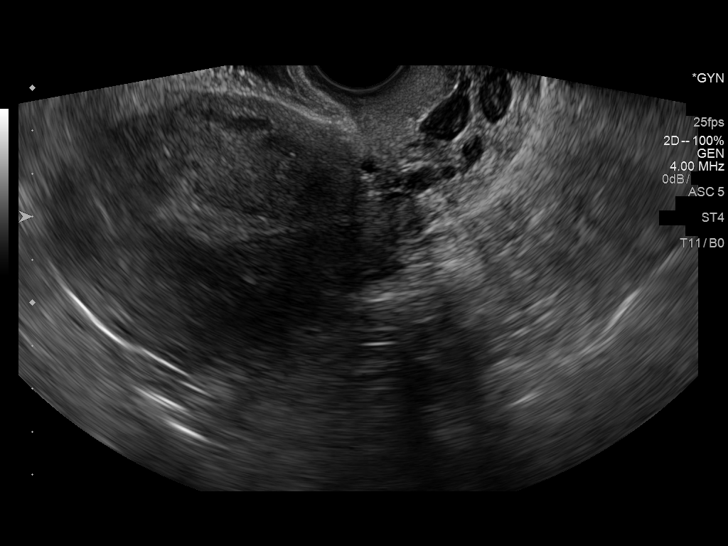

[13 of 25 positions shown; findings below may reference images not displayed]

FINDINGS: Uterus

Measurements: 11.4 x 6.4 x 7.4 cm. Uterus demonstrates a
heterogeneous echotexture without discrete fibroid or other mass.
Prominent nabothian cysts noted at the cervix.

Endometrium

Thickness: 10 mm.  No focal abnormality visualized.

Right ovary

Nonvisualization of the right ovary.  No adnexal mass.

Left ovary

Measurements: 3.3 x 1.5 x 2.2 cm. Normal appearance/no adnexal mass.

Other findings

No abnormal free fluid.
IMPRESSION: 1. No acute abnormality within the pelvis.
2. Nonvisualization of the right ovary.  No adnexal mass.
3. Otherwise negative pelvic ultrasound. Normal appearance of the
uterus, endometrium, and left ovary.

## 2020-02-11 ENCOUNTER — Other Ambulatory Visit: Payer: Self-pay | Admitting: Physician Assistant

## 2020-02-11 DIAGNOSIS — E6609 Other obesity due to excess calories: Secondary | ICD-10-CM

## 2020-02-11 DIAGNOSIS — E8881 Metabolic syndrome: Secondary | ICD-10-CM

## 2020-02-11 DIAGNOSIS — Z6836 Body mass index (BMI) 36.0-36.9, adult: Secondary | ICD-10-CM

## 2020-03-02 ENCOUNTER — Other Ambulatory Visit: Payer: Self-pay | Admitting: Physician Assistant

## 2020-03-02 DIAGNOSIS — I493 Ventricular premature depolarization: Secondary | ICD-10-CM

## 2020-03-17 ENCOUNTER — Other Ambulatory Visit: Payer: Self-pay | Admitting: Physician Assistant

## 2020-03-17 DIAGNOSIS — F411 Generalized anxiety disorder: Secondary | ICD-10-CM

## 2020-03-17 NOTE — Telephone Encounter (Signed)
Patient called requesting refill for Xanax since she is going out of town on Monday and needs these to fly. She is past due for OV, call was sent to front office for patient to get scheduled. RX pended, please send if OK to give enough until her OV

## 2020-03-31 ENCOUNTER — Ambulatory Visit (INDEPENDENT_AMBULATORY_CARE_PROVIDER_SITE_OTHER): Payer: No Typology Code available for payment source | Admitting: Physician Assistant

## 2020-03-31 ENCOUNTER — Encounter: Payer: Self-pay | Admitting: Physician Assistant

## 2020-03-31 VITALS — BP 110/70 | HR 80 | Ht 65.0 in | Wt 213.0 lb

## 2020-03-31 DIAGNOSIS — E6609 Other obesity due to excess calories: Secondary | ICD-10-CM

## 2020-03-31 DIAGNOSIS — Z6836 Body mass index (BMI) 36.0-36.9, adult: Secondary | ICD-10-CM

## 2020-03-31 DIAGNOSIS — E785 Hyperlipidemia, unspecified: Secondary | ICD-10-CM

## 2020-03-31 DIAGNOSIS — E8881 Metabolic syndrome: Secondary | ICD-10-CM

## 2020-03-31 DIAGNOSIS — I493 Ventricular premature depolarization: Secondary | ICD-10-CM

## 2020-03-31 DIAGNOSIS — I10 Essential (primary) hypertension: Secondary | ICD-10-CM

## 2020-03-31 DIAGNOSIS — R7303 Prediabetes: Secondary | ICD-10-CM

## 2020-03-31 DIAGNOSIS — F411 Generalized anxiety disorder: Secondary | ICD-10-CM

## 2020-03-31 LAB — POCT GLYCOSYLATED HEMOGLOBIN (HGB A1C): Hemoglobin A1C: 5.2 % (ref 4.0–5.6)

## 2020-03-31 MED ORDER — ALPRAZOLAM 0.5 MG PO TABS: ORAL_TABLET | ORAL | 2 refills | Status: DC

## 2020-03-31 MED ORDER — SERTRALINE HCL 50 MG PO TABS: 50.0000 mg | ORAL_TABLET | Freq: Every day | ORAL | 1 refills | Status: DC

## 2020-03-31 MED ORDER — METFORMIN HCL 500 MG PO TABS: 500.0000 mg | ORAL_TABLET | Freq: Two times a day (BID) | ORAL | 1 refills | Status: DC

## 2020-03-31 MED ORDER — NEBIVOLOL HCL 5 MG PO TABS: ORAL_TABLET | ORAL | 1 refills | Status: DC

## 2020-03-31 MED ORDER — ATORVASTATIN CALCIUM 40 MG PO TABS: 40.0000 mg | ORAL_TABLET | Freq: Every day | ORAL | 1 refills | Status: DC

## 2020-03-31 MED ORDER — OZEMPIC (1 MG/DOSE) 2 MG/1.5ML ~~LOC~~ SOPN: 1.0000 mg | PEN_INJECTOR | SUBCUTANEOUS | 5 refills | Status: DC

## 2020-03-31 NOTE — Progress Notes (Signed)
GREAT A1C.

## 2020-03-31 NOTE — Progress Notes (Signed)
l °

## 2020-04-03 ENCOUNTER — Encounter: Payer: Self-pay | Admitting: Physician Assistant

## 2020-04-03 NOTE — Progress Notes (Signed)
Subjective:    Patient ID: [name], female    DOB: 1970-09-28, 49 y.o.   MRN: [id number]  HPI Pt is a 49 yo obese female with HTN, pre-diabetes, PVC, metabolic syndrome, dyslipidemia and anxiety who presents to the clinic for medication refills.   Pt is doing well. She is still frustrated because she is not losing weight like she would like. She denies any significant palpitations, headaches, or chest pain. She is on ozempic which seemed to help a lot at first but not anymore.   Mood is great. No SI/HC. Taking zoloft and xanax.  .. Active Ambulatory Problems    Diagnosis Date Noted  . Anxiety state 06/08/2007  . Essential hypertension 06/08/2007  . PALPITATIONS 04/26/2008  . Bilateral lower abdominal cramping 11/04/2007  . LUMBAR STRAIN 06/10/2007  . Breast mass, right 01/19/2014  . Sinus tachycardia 01/26/2014  . Hyperlipidemia 01/31/2014  . PVC's (premature ventricular contractions) 05/31/2014  . Weight gain 09/19/2014  . Other fatigue 09/19/2014  . Dry skin 09/19/2014  . OSA (obstructive sleep apnea) 04/03/2015  . Dysfunctional uterine bleeding 06/28/2015  . Family history of breast cancer 12/12/2015  . Family history of melanoma 12/12/2015  . Right thyroid nodule 01/29/2016  . Uveitis, intermediate 02/29/2016  . Mass in neck 05/21/2016  . Chronic fatigue syndrome 09/17/2016  . Obesity (BMI 35.0-39.9 without comorbidity) 09/19/2016  . Drug-induced constipation 11/18/2016  . Dyslipidemia (high LDL; low HDL) 01/13/2017  . Vitamin D deficiency 09/14/2017  . Prediabetes 02/13/2018  . Pelvic pain 02/13/2018  . Colon cancer high risk 02/13/2018  . Metabolic syndrome 05/28/5101  . Depressed mood 10/27/2018  . Class 2 obesity due to excess calories without serious comorbidity with body mass index (BMI) of 36.0 to 36.9 in adult 10/27/2018  . Left foot pain 09/29/2019  . COVID-19 virus infection 11/02/2019   Resolved Ambulatory Problems    Diagnosis Date Noted   . Redness of left eye 12/12/2015   Past Medical History:  Diagnosis Date  . Anxiety   . Hypertension   . Obesity (BMI 30-39.9)       Review of Systems  All other systems reviewed and are negative.      Objective:   Physical Exam Constitutional:      Appearance: Normal appearance. She is obese.  Cardiovascular:     Rate and Rhythm: Normal rate and regular rhythm.     Pulses: Normal pulses.     Heart sounds: Normal heart sounds.  Pulmonary:     Effort: Pulmonary effort is normal.     Breath sounds: Normal breath sounds.  Neurological:     General: No focal deficit present.     Mental Status: She is alert and oriented to person, place, and time.  Psychiatric:        Mood and Affect: Mood normal.    .. Depression screen [facility] 2/9 03/31/2020 09/29/2019 08/02/2019 10/23/2018 09/14/2017  Decreased Interest 0 0 0 1 0  Down, Depressed, Hopeless 0 0 0 1 0  PHQ - 2 Score 0 0 0 2 0  Altered sleeping - 1 - 3 0  Tired, decreased energy - 3 - 2 0  Change in appetite - 2 - 3 0  Feeling bad or failure about yourself  - 0 - 0 0  Trouble concentrating - 0 - 0 0  Moving slowly or fidgety/restless - 0 - 0 0  Suicidal thoughts - 0 - 0 0  PHQ-9 Score - 6 - 10 0  Difficult doing work/chores - Not difficult at all - Somewhat difficult Not difficult at all   .[name] GAD 7 : Generalized Anxiety Score 09/29/2019 08/02/2019 10/23/2018 09/14/2017  Nervous, Anxious, on Edge 0 0 0 1  Control/stop worrying 0 0 0 1  Worry too much - different things 0 0 0 1  Trouble relaxing 1 0 0 1  Restless 1 0 0 0  Easily annoyed or irritable 1 0 0 0  Afraid - awful might happen 0 0 0 0  Total GAD 7 Score 3 0 0 4  Anxiety Difficulty Not difficult at all Not difficult at all Not difficult at all Not difficult at all           Assessment & Plan:  Marland KitchenMarland KitchenDiagnoses and all orders for this visit:  Essential hypertension  Anxiety state -     Discontinue: sertraline (ZOLOFT) 50 MG tablet; Take 1 tablet (50 mg total)  by mouth daily. -     Discontinue: ALPRAZolam (XANAX) 0.5 MG tablet; Take one tablet as needed for anxiety. -     ALPRAZolam (XANAX) 0.5 MG tablet; Take one tablet as needed for anxiety. -     sertraline (ZOLOFT) 50 MG tablet; Take 1 tablet (50 mg total) by mouth daily.  Metabolic syndrome -     Semaglutide, 1 MG/DOSE, (OZEMPIC, 1 MG/DOSE,) 2 MG/1.5ML SOPN; Inject 0.75 mLs (1 mg total) into the skin once a week. -     Ambulatory referral to Family Practice  Class 2 obesity due to excess calories without serious comorbidity with body mass index (BMI) of 36.0 to 36.9 in adult -     Semaglutide, 1 MG/DOSE, (OZEMPIC, 1 MG/DOSE,) 2 MG/1.5ML SOPN; Inject 0.75 mLs (1 mg total) into the skin once a week. -     Ambulatory referral to Family Practice  Prediabetes -     metFORMIN (GLUCOPHAGE) 500 MG tablet; Take 1 tablet (500 mg total) by mouth 2 (two) times daily with a meal. -     POCT HgB A1C -     Ambulatory referral to [facility]  PVC's (premature ventricular contractions) -     nebivolol (BYSTOLIC) 5 MG tablet; TAKE 1 TABLET DAILY  Dyslipidemia (high LDL; low HDL) -     atorvastatin (LIPITOR) 40 MG tablet; Take 1 tablet (40 mg total) by mouth daily. -     Ambulatory referral to [facility]  .[name] Lab Results  Component Value Date   HGBA1C 5.2 03/31/2020   Continue on metformin. A1C great.   Labs UTD.  PHQ and GAD numbers great.  Medication refilled.  Continue to work on [employer].  Referred to dr. Leafy Ro.   Pt declined covid vaccines. Pt aware of risk.   Follow up in 6 months.

## 2020-04-05 ENCOUNTER — Encounter (INDEPENDENT_AMBULATORY_CARE_PROVIDER_SITE_OTHER): Payer: Self-pay | Admitting: Family Medicine

## 2020-04-05 ENCOUNTER — Other Ambulatory Visit: Payer: Self-pay

## 2020-04-05 ENCOUNTER — Ambulatory Visit (INDEPENDENT_AMBULATORY_CARE_PROVIDER_SITE_OTHER): Payer: No Typology Code available for payment source | Admitting: Family Medicine

## 2020-04-05 ENCOUNTER — Ambulatory Visit (INDEPENDENT_AMBULATORY_CARE_PROVIDER_SITE_OTHER): Payer: Self-pay | Admitting: Family Medicine

## 2020-04-05 VITALS — BP 111/72 | HR 77 | Temp 98.0°F | Ht 65.0 in | Wt 208.0 lb

## 2020-04-05 DIAGNOSIS — R5383 Other fatigue: Secondary | ICD-10-CM

## 2020-04-05 DIAGNOSIS — Z9189 Other specified personal risk factors, not elsewhere classified: Secondary | ICD-10-CM

## 2020-04-05 DIAGNOSIS — R0602 Shortness of breath: Secondary | ICD-10-CM

## 2020-04-05 DIAGNOSIS — E669 Obesity, unspecified: Secondary | ICD-10-CM

## 2020-04-05 DIAGNOSIS — I493 Ventricular premature depolarization: Secondary | ICD-10-CM | POA: Diagnosis not present

## 2020-04-05 DIAGNOSIS — F411 Generalized anxiety disorder: Secondary | ICD-10-CM | POA: Diagnosis not present

## 2020-04-05 DIAGNOSIS — E559 Vitamin D deficiency, unspecified: Secondary | ICD-10-CM | POA: Diagnosis not present

## 2020-04-05 DIAGNOSIS — E8881 Metabolic syndrome: Secondary | ICD-10-CM

## 2020-04-05 DIAGNOSIS — Z6834 Body mass index (BMI) 34.0-34.9, adult: Secondary | ICD-10-CM

## 2020-04-05 DIAGNOSIS — E66811 Obesity, class 1: Secondary | ICD-10-CM

## 2020-04-05 DIAGNOSIS — E88819 Insulin resistance, unspecified: Secondary | ICD-10-CM

## 2020-04-05 DIAGNOSIS — Z0289 Encounter for other administrative examinations: Secondary | ICD-10-CM

## 2020-04-06 LAB — COMPREHENSIVE METABOLIC PANEL
ALT: 32 IU/L (ref 0–32)
AST: 17 IU/L (ref 0–40)
Albumin/Globulin Ratio: 1.6 (ref 1.2–2.2)
Albumin: 4.5 g/dL (ref 3.8–4.8)
Alkaline Phosphatase: 82 IU/L (ref 48–121)
BUN/Creatinine Ratio: 23 (ref 9–23)
BUN: 14 mg/dL (ref 6–24)
Bilirubin Total: 0.3 mg/dL (ref 0.0–1.2)
CO2: 24 mmol/L (ref 20–29)
Calcium: 9.8 mg/dL (ref 8.7–10.2)
Chloride: 103 mmol/L (ref 96–106)
Creatinine, Ser: 0.61 mg/dL (ref 0.57–1.00)
GFR calc Af Amer: 122 mL/min/{1.73_m2} (ref >59)
GFR calc non Af Amer: 106 mL/min/{1.73_m2} (ref >59)
Globulin, Total: 2.8 g/dL (ref 1.5–4.5)
Glucose: 92 mg/dL (ref 65–99)
Potassium: 5.1 mmol/L (ref 3.5–5.2)
Sodium: 141 mmol/L (ref 134–144)
Total Protein: 7.3 g/dL (ref 6.0–8.5)

## 2020-04-06 LAB — LIPID PANEL WITH LDL/HDL RATIO
Cholesterol, Total: 240 mg/dL — ABNORMAL HIGH (ref 100–199)
HDL: 51 mg/dL (ref >39)
LDL Chol Calc (NIH): 149 mg/dL — ABNORMAL HIGH (ref 0–99)
LDL/HDL Ratio: 2.9 ratio (ref 0.0–3.2)
Triglycerides: 220 mg/dL — ABNORMAL HIGH (ref 0–149)
VLDL Cholesterol Cal: 40 mg/dL (ref 5–40)

## 2020-04-06 LAB — VITAMIN D 25 HYDROXY (VIT D DEFICIENCY, FRACTURES): Vit D, 25-Hydroxy: 47.5 ng/mL (ref 30.0–100.0)

## 2020-04-06 LAB — INSULIN, RANDOM: INSULIN: 25 u[IU]/mL — ABNORMAL HIGH (ref 2.6–24.9)

## 2020-04-06 LAB — CBC WITH DIFFERENTIAL/PLATELET
Basophils Absolute: 0.1 10*3/uL (ref 0.0–0.2)
Basos: 1 %
EOS (ABSOLUTE): 0.2 10*3/uL (ref 0.0–0.4)
Eos: 2 %
Hematocrit: 43.6 % (ref 34.0–46.6)
Hemoglobin: 14.8 g/dL (ref 11.1–15.9)
Immature Grans (Abs): 0 10*3/uL (ref 0.0–0.1)
Immature Granulocytes: 0 %
Lymphocytes Absolute: 3.1 10*3/uL (ref 0.7–3.1)
Lymphs: 31 %
MCH: 32.4 pg (ref 26.6–33.0)
MCHC: 33.9 g/dL (ref 31.5–35.7)
MCV: 95 fL (ref 79–97)
Monocytes Absolute: 0.6 10*3/uL (ref 0.1–0.9)
Monocytes: 5 %
Neutrophils Absolute: 6.2 10*3/uL (ref 1.4–7.0)
Neutrophils: 61 %
Platelets: 331 10*3/uL (ref 150–450)
RBC: 4.57 x10E6/uL (ref 3.77–5.28)
RDW: 13 % (ref 11.7–15.4)
WBC: 10.1 10*3/uL (ref 3.4–10.8)

## 2020-04-06 LAB — VITAMIN B12: Vitamin B-12: 261 pg/mL (ref 232–1245)

## 2020-04-06 LAB — T3: T3, Total: 121 ng/dL (ref 71–180)

## 2020-04-06 LAB — FOLATE: Folate: 5.5 ng/mL (ref >3.0)

## 2020-04-06 LAB — T4: T4, Total: 8.2 ug/dL (ref 4.5–12.0)

## 2020-04-06 LAB — TSH: TSH: 2.11 u[IU]/mL (ref 0.450–4.500)

## 2020-04-06 NOTE — Progress Notes (Signed)
Dear [name] Starch L. Alden Hipp, PA-C,   Thank you for referring [name] to our clinic. The following note includes my evaluation and treatment recommendations.  Chief Complaint:   OBESITY [name] (MR# [id number]) is a 49 y.o. female who presents for evaluation and treatment of obesity and related comorbidities. Current BMI is Body mass index is 34.61 kg/m. [name] has been struggling with her weight for many years and has been unsuccessful in either losing weight, maintaining weight loss, or reaching her healthy weight goal.  [name] is currently in the action stage of change and ready to dedicate time achieving and maintaining a healthier weight. [name] is interested in becoming our patient and working on intensive lifestyle modifications including (but not limited to) diet and exercise for weight loss.  [name]'s habits were reviewed today and are as follows: Her family eats meals together, she thinks her family will eat healthier with her, her desired weight loss is 78 lbs, she started gaining weight after children in 2002, her heaviest weight ever was 231 pounds, she has significant food cravings issues, she snacks frequently in the evenings, she skips meals frequently, she is frequently drinking liquids with calories, she frequently makes poor food choices, she has problems with excessive hunger, she frequently eats larger portions than normal and she struggles with emotional eating.  Depression Screen [name]'s Food and Mood (modified PHQ-9) score was 17.  Depression screen PHQ 2/9 04/05/2020  Decreased Interest 2  Down, Depressed, Hopeless 2  PHQ - 2 Score 4  Altered sleeping 3  Tired, decreased energy 3  Change in appetite 3  Feeling bad or failure about yourself  2  Trouble concentrating 1  Moving slowly or fidgety/restless 1  Suicidal thoughts 0  PHQ-9 Score 17  Difficult doing work/chores Somewhat difficult   Subjective:   1. Other  fatigue [name] admits to daytime somnolence and admits to waking up still tired. Patent has a history of symptoms of daytime fatigue. [name] generally gets 6 or 7 hours of sleep per night, and states that she has generally restful sleep. Snoring is present. Apneic episodes are not present. Epworth Sleepiness Score is 8. EKG-normal sinus rhythm at 80 BPM.  2. SOB (shortness of breath) on exertion [name] notes increasing shortness of breath with exercising and seems to be worsening over time with weight gain. She notes getting out of breath sooner with activity than she used to. This has not gotten worse recently. [name] denies shortness of breath at rest or orthopnea.  3. Insulin resistance [name] has a history of polycystic ovarian syndrome. She is on metformin and Ozempic. Last A1c was 5.2 in August 2021.   4. PVC's (premature ventricular contractions) [name] is on Bystolic, and she has seen Cardiology.  5. Vitamin D deficiency [name] is on 5,000 IU daily Vit D. She notes fatigue, and denies nausea, vomiting, or muscle weakness. Last Vit D level was 45.  6. GAD (generalized anxiety disorder) [name] is on Zoloft and Alprazolam. She feels her anxiety is well controlled.  7. At risk for diabetes mellitus [name] is at higher than average risk for developing diabetes due to her obesity.   Assessment/Plan:   1. Other fatigue [name] does feel that her weight is causing her energy to be lower than it should be. Fatigue may be related to obesity, depression or many other causes. Labs will be ordered, and in the meanwhile, [name] will focus on self care including making healthy food choices, increasing physical  activity and focusing on stress reduction.  - EKG 12-Lead - Vitamin B12 - Folate - T3 - T4 - TSH  2. SOB (shortness of breath) on exertion [name] does feel that she gets out of breath more easily that she used to when she exercises. [name]'s shortness of  breath appears to be obesity related and exercise induced. She has agreed to work on weight loss and gradually increase exercise to treat her exercise induced shortness of breath. Will continue to monitor closely.  - CBC with Differential/Platelet - Comprehensive metabolic panel - Lipid Panel With LDL/HDL Ratio  3. Insulin resistance [name] will continue to work on weight loss, exercise, and decreasing simple carbohydrates to help decrease the risk of diabetes. we will check labs today. [name] agreed to follow-up with [place] as directed to closely monitor her progress.  - Insulin, random  4. PVC's (premature ventricular contractions) [name] will continue her medications and will continue to follow up with Cardiology.  5. Vitamin D deficiency Low Vitamin D level contributes to fatigue and are associated with obesity, breast, and colon cancer. We will check labs today. [name] will follow-up for routine testing of Vitamin D, at least 2-3 times per year to avoid over-replacement.  - VITAMIN D 25 Hydroxy (Vit-D Deficiency, Fractures)  6. GAD (generalized anxiety disorder) Behavior modification techniques were discussed today to help Tamica deal with her anxiety. We will follow up at her next appointment. Orders and follow up as documented in patient record.   7. At risk for diabetes mellitus [name] was given approximately 15 minutes of diabetes education and counseling today. We discussed intensive lifestyle modifications today with an emphasis on weight loss as well as increasing exercise and decreasing simple carbohydrates in her diet. We also reviewed medication options with an emphasis on risk versus benefit of those discussed.   Repetitive spaced learning was employed today to elicit superior memory formation and behavioral change.  8. Class 1 obesity with serious comorbidity and body mass index (BMI) of 34.0 to 34.9 in adult, unspecified obesity type [name] is currently in the  action stage of change and her goal is to continue with weight loss efforts. I recommend [name] begin the structured treatment plan as follows:  She has agreed to the Category 2 Plan + 100 calories.  Exercise goals: All adults should avoid inactivity. Some physical activity is better than none, and adults who participate in any amount of physical activity gain some health benefits.   Behavioral modification strategies: increasing lean protein intake, increasing vegetables, decreasing eating out and meal planning and cooking strategies.  She was informed of the importance of frequent follow-up visits to maximize her success with intensive lifestyle modifications for her multiple health conditions. She was informed we would discuss her lab results at her next visit unless there is a critical issue that needs to be addressed sooner. Deetta agreed to keep her next visit at the agreed upon time to discuss these results.  Objective:   Blood pressure 111/72, pulse 77, temperature 98 F (36.7 C), temperature source Oral, height 5\' 5"  (1.651 m), weight 208 lb (94.3 kg), last menstrual period 02/23/2020, SpO2 95 %. Body mass index is 34.61 kg/m.  EKG: Normal sinus rhythm, rate 80 BPM.  Indirect Calorimeter completed today shows a VO2 of 245 and a REE of 1705.  Her calculated basal metabolic rate is 6226 thus her basal metabolic rate is better than expected.  General: Cooperative, alert, well developed, in no acute distress. HEENT: Conjunctivae and  lids unremarkable. Cardiovascular: Regular rhythm.  Lungs: Normal work of breathing. Neurologic: No focal deficits.   Lab Results  Component Value Date   CREATININE 0.61 04/05/2020   BUN 14 04/05/2020   NA 141 04/05/2020   K 5.1 04/05/2020   CL 103 04/05/2020   CO2 24 04/05/2020   Lab Results  Component Value Date   ALT 32 04/05/2020   AST 17 04/05/2020   ALKPHOS 82 04/05/2020   BILITOT 0.3 04/05/2020   Lab Results  Component Value Date    HGBA1C 5.2 03/31/2020   HGBA1C 5.4 10/15/2019   HGBA1C 5.4 10/23/2018   HGBA1C 5.4 09/12/2017   HGBA1C 5.5 05/01/2015   Lab Results  Component Value Date   INSULIN 25.0 (H) 04/05/2020   Lab Results  Component Value Date   TSH 2.110 04/05/2020   Lab Results  Component Value Date   CHOL 240 (H) 04/05/2020   HDL 51 04/05/2020   LDLCALC 149 (H) 04/05/2020   LDLDIRECT 194.4 09/10/2010   TRIG 220 (H) 04/05/2020   CHOLHDL 3.6 10/15/2019   Lab Results  Component Value Date   WBC 10.1 04/05/2020   HGB 14.8 04/05/2020   HCT 43.6 04/05/2020   MCV 95 04/05/2020   PLT 331 04/05/2020   Lab Results  Component Value Date   IRON 91 05/01/2015   TIBC 376 05/01/2015   FERRITIN 60 06/27/2015   Attestation Statements:   This is the patient's first visit at [facility]. The patient's NEW PATIENT PACKET was reviewed at length. Included in the packet: current and past health history, medications, allergies, ROS, gynecologic history (women only), surgical history, family history, social history, weight history, weight loss surgery history (for those that have had weight loss surgery), nutritional evaluation, mood and food questionnaire, PHQ9, Epworth questionnaire, sleep habits questionnaire, patient life and health improvement goals questionnaire. These will all be scanned into the patient's chart under media.   During the visit, I independently reviewed the patient's EKG, bioimpedance scale results, and indirect calorimeter results. I used this information to tailor a meal plan for the patient that will help her to lose weight and will improve her obesity-related conditions going forward. I performed a medically necessary appropriate examination and/or evaluation. I discussed the assessment and treatment plan with the patient. The patient was provided an opportunity to ask questions and all were answered. The patient agreed with the plan and demonstrated an understanding of the  instructions. Labs were ordered at this visit and will be reviewed at the next visit unless more critical results need to be addressed immediately. Clinical information was updated and documented in the EMR.   Time spent on visit including pre-visit chart review and post-visit care was 45 minutes.   A separate 15 minutes was spent on risk counseling (see above).    I, Trixie Dredge, am acting as transcriptionist for Coralie Common, MD.  I have reviewed the above documentation for accuracy and completeness, and I agree with the above. - Jinny Blossom, MD

## 2020-04-19 ENCOUNTER — Other Ambulatory Visit: Payer: Self-pay

## 2020-04-19 ENCOUNTER — Encounter (INDEPENDENT_AMBULATORY_CARE_PROVIDER_SITE_OTHER): Payer: Self-pay | Admitting: Family Medicine

## 2020-04-19 ENCOUNTER — Ambulatory Visit (INDEPENDENT_AMBULATORY_CARE_PROVIDER_SITE_OTHER): Payer: No Typology Code available for payment source | Admitting: Family Medicine

## 2020-04-19 ENCOUNTER — Other Ambulatory Visit (INDEPENDENT_AMBULATORY_CARE_PROVIDER_SITE_OTHER): Payer: Self-pay | Admitting: Family Medicine

## 2020-04-19 VITALS — BP 94/67 | HR 83 | Temp 98.0°F | Ht 65.0 in | Wt 207.0 lb

## 2020-04-19 DIAGNOSIS — E7849 Other hyperlipidemia: Secondary | ICD-10-CM | POA: Diagnosis not present

## 2020-04-19 DIAGNOSIS — E559 Vitamin D deficiency, unspecified: Secondary | ICD-10-CM | POA: Diagnosis not present

## 2020-04-19 DIAGNOSIS — Z9189 Other specified personal risk factors, not elsewhere classified: Secondary | ICD-10-CM

## 2020-04-19 DIAGNOSIS — Z6836 Body mass index (BMI) 36.0-36.9, adult: Secondary | ICD-10-CM

## 2020-04-19 DIAGNOSIS — E6609 Other obesity due to excess calories: Secondary | ICD-10-CM

## 2020-04-19 DIAGNOSIS — E669 Obesity, unspecified: Secondary | ICD-10-CM | POA: Diagnosis not present

## 2020-04-19 DIAGNOSIS — Z6834 Body mass index (BMI) 34.0-34.9, adult: Secondary | ICD-10-CM

## 2020-04-19 DIAGNOSIS — E8881 Metabolic syndrome: Secondary | ICD-10-CM | POA: Diagnosis not present

## 2020-04-19 MED ORDER — WEGOVY 1.7 MG/0.75ML ~~LOC~~ SOAJ: 1.7000 mg | SUBCUTANEOUS | 0 refills | Status: DC

## 2020-04-20 NOTE — Progress Notes (Signed)
Chief Complaint:   OBESITY [name] is here to discuss her progress with her obesity treatment plan along with follow-up of her obesity related diagnoses. [name] is on the Category 2 Plan + 100 calories and states she is following her eating plan approximately 60% of the time. [name] states she is doing 0 minutes 0 times per week.  Today's visit was #: 2 Starting weight: 208 lbs Starting date: 04/05/2020 Today's weight: 207 lbs Today's date: 04/19/2020 Total lbs lost to date: 1 Total lbs lost since last in-office visit: 1  Interim History: [name] voices the first week she was doing well but the second week she really struggled with breakfast. At night time after dinner she was struggling with hunger. Some nights she was not following the meal plan. She had potatoes one night and pasta another night. She is somewhat stumped about how to put the meals together.  Subjective:   1. Other hyperlipidemia [name]'s last LDL was 149, HDL 51, and triglycerides 220. She is not on statin. I discussed labs with the patient today.  2. Vitamin D deficiency [name] denies nausea, vomiting, or muscle weakness, but she notes fatigue. She is on 5,000 IU daily. I discussed labs with the patient today.  3. Insulin resistance [name]'s last A1c was 5.2 and insulin 25.0. She is on metformin and insulin. I discussed labs with the patient today.  4. At risk for diabetes mellitus [name] is at higher than average risk for developing diabetes due to her obesity.   Assessment/Plan:   1. Other hyperlipidemia Cardiovascular risk and specific lipid/LDL goals reviewed. We discussed several lifestyle modifications today and Sherolyn will continue to work on diet, exercise and weight loss efforts. We will repeat labs in 3 months, if labs are unchanged then we will discuss initiation of medications. Orders and follow up as documented in patient record.   Counseling Intensive lifestyle modifications  are the first line treatment for this issue.  Dietary changes: Increase soluble fiber. Decrease simple carbohydrates.  Exercise changes: Moderate to vigorous-intensity aerobic activity 150 minutes per week if tolerated.  Lipid-lowering medications: see documented in medical record.  2. Vitamin D deficiency Low Vitamin D level contributes to fatigue and are associated with obesity, breast, and colon cancer. We will repeat Vit D in 3 months. [name] will follow-up for routine testing of Vitamin D, at least 2-3 times per year to avoid over-replacement.  3. Insulin resistance [name] will continue to work on weight loss, exercise, and decreasing simple carbohydrates to help decrease the risk of diabetes. [name] will stop Ozempic and we will repeat labs in 3 months. [name] agreed to follow-up with [place] as directed to closely monitor her progress.  4. At risk for diabetes mellitus [name] was given approximately 30 minutes of diabetes education and counseling today. We discussed intensive lifestyle modifications today with an emphasis on weight loss as well as increasing exercise and decreasing simple carbohydrates in her diet. We also reviewed medication options with an emphasis on risk versus benefit of those discussed.   Repetitive spaced learning was employed today to elicit superior memory formation and behavioral change.  5. Class 1 obesity with serious comorbidity and body mass index (BMI) of 34.0 to 34.9 in adult, unspecified obesity type [name] is currently in the action stage of change. As such, her goal is to continue with weight loss efforts. She has agreed to the Category 2 Plan + 100 calories.   We discussed various medication options to help [facility]  with her weight loss efforts and we both agreed to start Wegovy 0.75 SubQ weekly with no refills.  - Semaglutide-Weight Management (WEGOVY) 1.7 MG/0.75ML SOAJ; Inject 0.75 mLs (1.7 mg total) into the skin once a week.  Dispense: 3  mL; Refill: 0  Exercise goals: No exercise has been prescribed at this time.  Behavioral modification strategies: increasing lean protein intake, increasing vegetables, meal planning and cooking strategies, keeping healthy foods in the home and planning for success.  [name] has agreed to follow-up with our clinic in 2 weeks. She was informed of the importance of frequent follow-up visits to maximize her success with intensive lifestyle modifications for her multiple health conditions.   Objective:   Blood pressure 94/67, pulse 83, temperature 98 F (36.7 C), temperature source Oral, height 5\' 5"  (1.651 m), weight 207 lb (93.9 kg), SpO2 95 %. Body mass index is 34.45 kg/m.  General: Cooperative, alert, well developed, in no acute distress. HEENT: Conjunctivae and lids unremarkable. Cardiovascular: Regular rhythm.  Lungs: Normal work of breathing. Neurologic: No focal deficits.   Lab Results  Component Value Date   CREATININE 0.61 04/05/2020   BUN 14 04/05/2020   NA 141 04/05/2020   K 5.1 04/05/2020   CL 103 04/05/2020   CO2 24 04/05/2020   Lab Results  Component Value Date   ALT 32 04/05/2020   AST 17 04/05/2020   ALKPHOS 82 04/05/2020   BILITOT 0.3 04/05/2020   Lab Results  Component Value Date   HGBA1C 5.2 03/31/2020   HGBA1C 5.4 10/15/2019   HGBA1C 5.4 10/23/2018   HGBA1C 5.4 09/12/2017   HGBA1C 5.5 05/01/2015   Lab Results  Component Value Date   INSULIN 25.0 (H) 04/05/2020   Lab Results  Component Value Date   TSH 2.110 04/05/2020   Lab Results  Component Value Date   CHOL 240 (H) 04/05/2020   HDL 51 04/05/2020   LDLCALC 149 (H) 04/05/2020   LDLDIRECT 194.4 09/10/2010   TRIG 220 (H) 04/05/2020   CHOLHDL 3.6 10/15/2019   Lab Results  Component Value Date   WBC 10.1 04/05/2020   HGB 14.8 04/05/2020   HCT 43.6 04/05/2020   MCV 95 04/05/2020   PLT 331 04/05/2020   Lab Results  Component Value Date   IRON 91 05/01/2015   TIBC 376 05/01/2015    FERRITIN 60 06/27/2015   Attestation Statements:   Reviewed by clinician on day of visit: allergies, medications, problem list, medical history, surgical history, family history, social history, and previous encounter notes.   I, Trixie Dredge, am acting as transcriptionist for Coralie Common, MD.  I have reviewed the above documentation for accuracy and completeness, and I agree with the above. - Jinny Blossom, MD

## 2020-04-24 ENCOUNTER — Ambulatory Visit (INDEPENDENT_AMBULATORY_CARE_PROVIDER_SITE_OTHER): Payer: Self-pay | Admitting: Family Medicine

## 2020-05-08 ENCOUNTER — Ambulatory Visit (INDEPENDENT_AMBULATORY_CARE_PROVIDER_SITE_OTHER): Payer: Self-pay | Admitting: Family Medicine

## 2020-05-09 ENCOUNTER — Encounter (INDEPENDENT_AMBULATORY_CARE_PROVIDER_SITE_OTHER): Payer: Self-pay | Admitting: Family Medicine

## 2020-05-09 ENCOUNTER — Encounter (INDEPENDENT_AMBULATORY_CARE_PROVIDER_SITE_OTHER): Payer: Self-pay

## 2020-05-09 ENCOUNTER — Ambulatory Visit (INDEPENDENT_AMBULATORY_CARE_PROVIDER_SITE_OTHER): Payer: No Typology Code available for payment source | Admitting: Family Medicine

## 2020-05-09 ENCOUNTER — Other Ambulatory Visit: Payer: Self-pay

## 2020-05-09 ENCOUNTER — Other Ambulatory Visit (INDEPENDENT_AMBULATORY_CARE_PROVIDER_SITE_OTHER): Payer: Self-pay | Admitting: Family Medicine

## 2020-05-09 VITALS — BP 114/76 | HR 75 | Temp 98.0°F | Ht 65.0 in | Wt 205.0 lb

## 2020-05-09 DIAGNOSIS — E8881 Metabolic syndrome: Secondary | ICD-10-CM

## 2020-05-09 DIAGNOSIS — E7849 Other hyperlipidemia: Secondary | ICD-10-CM | POA: Diagnosis not present

## 2020-05-09 DIAGNOSIS — Z6834 Body mass index (BMI) 34.0-34.9, adult: Secondary | ICD-10-CM

## 2020-05-09 DIAGNOSIS — E669 Obesity, unspecified: Secondary | ICD-10-CM

## 2020-05-09 MED ORDER — WEGOVY 1.7 MG/0.75ML ~~LOC~~ SOAJ: 1.7000 mg | SUBCUTANEOUS | 0 refills | Status: DC

## 2020-05-09 NOTE — Progress Notes (Signed)
Chief Complaint:   OBESITY [name] is here to discuss her progress with her obesity treatment plan along with follow-up of her obesity related diagnoses. [name] is on the Category 2 Plan + 100 calories and states she is following her eating plan approximately 75% of the time. [name] states she has not exercised since the last visit.   Today's visit was #: 3 Starting weight: 208 lbs Starting date: 04/05/20 Today's weight: 205 lbs Today's date: 05/09/2020 Total lbs lost to date: 3 Total lbs lost since last in-office visit: 2  Interim History: [name] has not been able to start [facility] yet secondary to not being able to get it filled. She does want to continue Ozempic. She is eating breakfast everyday and doing microwave meals at lunch. Her dinner is variable- meat and veggie, but she is also trying to make lower carb choices like buffalo wings.  Subjective:   1. Insulin resistance Maxwell has a diagnosis of insulin resistance based on her elevated fasting insulin level >5. Legend's A1c was 5.2 and Insulin was 25 on 04/05/20 while on Ozempic 1mg . She continues to work on diet and exercise to decrease her risk of diabetes. She denies GI side effects or feelings of hypoglycemia.  2. Other hyperlipidemia [name] has hyperlipidemia and has been trying to improve her cholesterol levels with intensive lifestyle modification including a low saturated fat diet, exercise and weight loss. She is not on a statin and is following the Category 2 plan + 100 calories. She denies any chest pain, claudication or myalgias.  Lab Results  Component Value Date   ALT 32 04/05/2020   AST 17 04/05/2020   ALKPHOS 82 04/05/2020   BILITOT 0.3 04/05/2020   Lab Results  Component Value Date   CHOL 240 (H) 04/05/2020   HDL 51 04/05/2020   LDLCALC 149 (H) 04/05/2020   LDLDIRECT 194.4 09/10/2010   TRIG 220 (H) 04/05/2020   CHOLHDL 3.6 10/15/2019    Assessment/Plan:   1. Insulin  resistance [name] will continue to work on weight loss, exercise, and decreasing simple carbohydrates to help decrease the risk of diabetes. She agrees to continue Ozempic with no refill needed. [name] agreed to follow-up with [place] as directed to closely monitor her progress.  2. Other hyperlipidemia Cardiovascular risk and specific lipid/LDL goals reviewed.  We discussed several lifestyle modifications today and [name] will continue to work on diet, exercise and weight loss efforts. Orders and follow up as documented in patient record. We will repeat labs in January 2022.  Counseling Intensive lifestyle modifications are the first line treatment for this issue. . Dietary changes: Increase soluble fiber. Decrease simple carbohydrates. . Exercise changes: Moderate to vigorous-intensity aerobic activity 150 minutes per week if tolerated. . Lipid-lowering medications: see documented in medical record.   3. Class 1 obesity with serious comorbidity and body mass index (BMI) of 34.0 to 34.9 in adult, unspecified obesity type  [name] is currently in the action stage of change. As such, her goal is to continue with weight loss efforts. She has agreed to the Category 2 Plan. We will refill Wegovy 1.7 mg SubQ weekly 43mL with no refills.  Exercise goals: All adults should avoid inactivity. Some physical activity is better than none, and adults who participate in any amount of physical activity gain some health benefits.  Behavioral modification strategies: increasing lean protein intake, increasing vegetables, meal planning and cooking strategies, keeping healthy foods in the home and planning for success.  [name] has agreed  to follow-up with our clinic in 2 weeks. She was informed of the importance of frequent follow-up visits to maximize her success with intensive lifestyle modifications for her multiple health conditions.   - Semaglutide-Weight Management (WEGOVY) 1.7 MG/0.75ML SOAJ; Inject  1.7 mg into the skin once a week.  Dispense: 3 mL; Refill: 0  Objective:   Blood pressure 114/76, pulse 75, temperature 98 F (36.7 C), temperature source Oral, height 5\' 4"  (1.626 m), weight 205 lb (93 kg), SpO2 99 %. Body mass index is 35.19 kg/m.  General: Cooperative, alert, well developed, in no acute distress. HEENT: Conjunctivae and lids unremarkable. Cardiovascular: Regular rhythm.  Lungs: Normal work of breathing. Neurologic: No focal deficits.   Lab Results  Component Value Date   CREATININE 0.61 04/05/2020   BUN 14 04/05/2020   NA 141 04/05/2020   K 5.1 04/05/2020   CL 103 04/05/2020   CO2 24 04/05/2020   Lab Results  Component Value Date   ALT 32 04/05/2020   AST 17 04/05/2020   ALKPHOS 82 04/05/2020   BILITOT 0.3 04/05/2020   Lab Results  Component Value Date   HGBA1C 5.2 03/31/2020   HGBA1C 5.4 10/15/2019   HGBA1C 5.4 10/23/2018   HGBA1C 5.4 09/12/2017   HGBA1C 5.5 05/01/2015   Lab Results  Component Value Date   INSULIN 25.0 (H) 04/05/2020   Lab Results  Component Value Date   TSH 2.110 04/05/2020   Lab Results  Component Value Date   CHOL 240 (H) 04/05/2020   HDL 51 04/05/2020   LDLCALC 149 (H) 04/05/2020   LDLDIRECT 194.4 09/10/2010   TRIG 220 (H) 04/05/2020   CHOLHDL 3.6 10/15/2019   Lab Results  Component Value Date   WBC 10.1 04/05/2020   HGB 14.8 04/05/2020   HCT 43.6 04/05/2020   MCV 95 04/05/2020   PLT 331 04/05/2020   Lab Results  Component Value Date   IRON 91 05/01/2015   TIBC 376 05/01/2015   FERRITIN 60 06/27/2015    Attestation Statements:   Reviewed by clinician on day of visit: allergies, medications, problem list, medical history, surgical history, family history, social history, and previous encounter notes.  Time spent on visit including pre-visit chart review and post-visit care and charting was 20 minutes.   IMarcille Blanco, CMA, am acting as transcriptionist for Coralie Common, MD   I have reviewed  the above documentation for accuracy and completeness, and I agree with the above. - Jinny Blossom, MD

## 2020-05-23 ENCOUNTER — Other Ambulatory Visit: Payer: Self-pay

## 2020-05-23 ENCOUNTER — Ambulatory Visit (INDEPENDENT_AMBULATORY_CARE_PROVIDER_SITE_OTHER): Payer: No Typology Code available for payment source | Admitting: Family Medicine

## 2020-05-23 ENCOUNTER — Encounter (INDEPENDENT_AMBULATORY_CARE_PROVIDER_SITE_OTHER): Payer: Self-pay | Admitting: Family Medicine

## 2020-05-23 VITALS — BP 101/70 | HR 79 | Temp 98.2°F | Ht 65.0 in | Wt 206.0 lb

## 2020-05-23 DIAGNOSIS — E7849 Other hyperlipidemia: Secondary | ICD-10-CM | POA: Diagnosis not present

## 2020-05-23 DIAGNOSIS — Z6834 Body mass index (BMI) 34.0-34.9, adult: Secondary | ICD-10-CM

## 2020-05-23 DIAGNOSIS — E8881 Metabolic syndrome: Secondary | ICD-10-CM | POA: Diagnosis not present

## 2020-05-23 DIAGNOSIS — E66811 Obesity, class 1: Secondary | ICD-10-CM

## 2020-05-23 DIAGNOSIS — E88819 Insulin resistance, unspecified: Secondary | ICD-10-CM

## 2020-05-23 DIAGNOSIS — E669 Obesity, unspecified: Secondary | ICD-10-CM | POA: Diagnosis not present

## 2020-05-23 MED ORDER — WEGOVY 1.7 MG/0.75ML ~~LOC~~ SOAJ: 1.7000 mg | SUBCUTANEOUS | 0 refills | Status: DC

## 2020-05-29 ENCOUNTER — Encounter (INDEPENDENT_AMBULATORY_CARE_PROVIDER_SITE_OTHER): Payer: Self-pay | Admitting: Family Medicine

## 2020-05-29 NOTE — Progress Notes (Signed)
Chief Complaint:   OBESITY Kennette is here to discuss her progress with her obesity treatment plan along with follow-up of her obesity related diagnoses. Solstice is on the Category 2 Plan +100 calories and states she is following her eating plan approximately 50% of the time. Darcus states she is walking for 15 minutes 3 times per week.  Today's visit was #: 4 Starting weight: 208 lbs Starting date: 04/05/2020 Today's weight: 206 lbs Today's date: 05/23/2020 Total lbs lost to date: 2 lbs Total lbs lost since last in-office visit: 0  Interim History: Nyesha says that the over the last 2 weeks, she did quite a few activities that kept her from being able to follow the plan as strictly as possible.  She got her first COVID vaccine.  No plans for the upcoming few weeks.  She has yet to try 8 ounces of protein and 2 cups of vegetables at dinner.  She is really struggling with dinner.    Subjective:   1. Other hyperlipidemia Merna has hyperlipidemia and has been trying to improve her cholesterol levels with intensive lifestyle modification including a low saturated fat diet, exercise and weight loss. She denies any chest pain, claudication or myalgias.  Last LDL 149, HDL 51, triglycerides 220.  She is not on medication.  Lab Results  Component Value Date   ALT 32 04/05/2020   AST 17 04/05/2020   ALKPHOS 82 04/05/2020   BILITOT 0.3 04/05/2020   Lab Results  Component Value Date   CHOL 240 (H) 04/05/2020   HDL 51 04/05/2020   LDLCALC 149 (H) 04/05/2020   LDLDIRECT 194.4 09/10/2010   TRIG 220 (H) 04/05/2020   CHOLHDL 3.6 10/15/2019   2. Insulin resistance Marcianne has a diagnosis of insulin resistance based on her elevated fasting insulin level >5. She continues to work on diet and exercise to decrease her risk of diabetes.  Last A1c 5.2, insulin 25.  She is on metformin 1000 mg every morning with breakfast.  Lab Results  Component Value Date   INSULIN 25.0 (H)  04/05/2020   Lab Results  Component Value Date   HGBA1C 5.2 03/31/2020   Assessment/Plan:   1. Other hyperlipidemia Cardiovascular risk and specific lipid/LDL goals reviewed.  We discussed several lifestyle modifications today and Philamena will continue to work on diet, exercise and weight loss efforts. Orders and follow up as documented in patient record.  Repeat labs in January 2022.  Counseling Intensive lifestyle modifications are the first line treatment for this issue. . Dietary changes: Increase soluble fiber. Decrease simple carbohydrates. . Exercise changes: Moderate to vigorous-intensity aerobic activity 150 minutes per week if tolerated. . Lipid-lowering medications: see documented in medical record.  2. Insulin resistance Deone will continue to work on weight loss, exercise, and decreasing simple carbohydrates to help decrease the risk of diabetes. Lovey agreed to follow-up with [place] as directed to closely monitor her progress.  Continue Category 2 and journaling if looking for options outside plan.  3. Class 1 obesity with serious comorbidity and body mass index (BMI) of 34.0 to 34.9 in adult, unspecified obesity type  -Start Semaglutide-Weight Management (WEGOVY) 1.7 MG/0.75ML SOAJ; Inject 1.7 mg into the skin once a week.  Dispense: 3 mL; Refill: 0  Luv is currently in the action stage of change. As such, her goal is to continue with weight loss efforts. She has agreed to the Category 2 Plan +100 calories.   Exercise goals: All adults should avoid inactivity. Some  physical activity is better than none, and adults who participate in any amount of physical activity gain some health benefits.  Behavioral modification strategies: increasing lean protein intake, meal planning and cooking strategies, keeping healthy foods in the home, planning for success and keeping a strict food journal.  Carrianne has agreed to follow-up with our clinic in 3 weeks. She was informed  of the importance of frequent follow-up visits to maximize her success with intensive lifestyle modifications for her multiple health conditions.   Objective:   Blood pressure 101/70, pulse 79, temperature 98.2 F (36.8 C), temperature source Oral, height 5\' 5"  (1.651 m), weight 206 lb (93.4 kg), SpO2 96 %. Body mass index is 34.28 kg/m.  General: Cooperative, alert, well developed, in no acute distress. HEENT: Conjunctivae and lids unremarkable. Cardiovascular: Regular rhythm.  Lungs: Normal work of breathing. Neurologic: No focal deficits.   Lab Results  Component Value Date   CREATININE 0.61 04/05/2020   BUN 14 04/05/2020   NA 141 04/05/2020   K 5.1 04/05/2020   CL 103 04/05/2020   CO2 24 04/05/2020   Lab Results  Component Value Date   ALT 32 04/05/2020   AST 17 04/05/2020   ALKPHOS 82 04/05/2020   BILITOT 0.3 04/05/2020   Lab Results  Component Value Date   HGBA1C 5.2 03/31/2020   HGBA1C 5.4 10/15/2019   HGBA1C 5.4 10/23/2018   HGBA1C 5.4 09/12/2017   HGBA1C 5.5 05/01/2015   Lab Results  Component Value Date   INSULIN 25.0 (H) 04/05/2020   Lab Results  Component Value Date   TSH 2.110 04/05/2020   Lab Results  Component Value Date   CHOL 240 (H) 04/05/2020   HDL 51 04/05/2020   LDLCALC 149 (H) 04/05/2020   LDLDIRECT 194.4 09/10/2010   TRIG 220 (H) 04/05/2020   CHOLHDL 3.6 10/15/2019   Lab Results  Component Value Date   WBC 10.1 04/05/2020   HGB 14.8 04/05/2020   HCT 43.6 04/05/2020   MCV 95 04/05/2020   PLT 331 04/05/2020   Lab Results  Component Value Date   IRON 91 05/01/2015   TIBC 376 05/01/2015   FERRITIN 60 06/27/2015   Attestation Statements:   Reviewed by clinician on day of visit: allergies, medications, problem list, medical history, surgical history, family history, social history, and previous encounter notes.  Time spent on visit including pre-visit chart review and post-visit care and charting was 16 minutes.   I, Network engineer, CMA, am acting as transcriptionist for Coralie Common, MD.  I have reviewed the above documentation for accuracy and completeness, and I agree with the above. - Jinny Blossom, MD

## 2020-06-07 ENCOUNTER — Encounter (INDEPENDENT_AMBULATORY_CARE_PROVIDER_SITE_OTHER): Payer: Self-pay | Admitting: Adult Health

## 2020-06-07 ENCOUNTER — Ambulatory Visit (INDEPENDENT_AMBULATORY_CARE_PROVIDER_SITE_OTHER): Payer: No Typology Code available for payment source | Admitting: Adult Health

## 2020-06-07 ENCOUNTER — Other Ambulatory Visit: Payer: Self-pay

## 2020-06-07 VITALS — BP 106/71 | HR 80 | Temp 98.4°F | Ht 65.0 in | Wt 205.0 lb

## 2020-06-07 DIAGNOSIS — Z6834 Body mass index (BMI) 34.0-34.9, adult: Secondary | ICD-10-CM | POA: Diagnosis not present

## 2020-06-07 DIAGNOSIS — E782 Mixed hyperlipidemia: Secondary | ICD-10-CM

## 2020-06-07 DIAGNOSIS — E669 Obesity, unspecified: Secondary | ICD-10-CM

## 2020-06-07 DIAGNOSIS — E8881 Metabolic syndrome: Secondary | ICD-10-CM

## 2020-06-07 NOTE — Progress Notes (Signed)
Chief Complaint:   [name] is here to discuss her progress with her obesity treatment plan along with follow-up of her obesity related diagnoses. [name] is on the Category 2 Plan + 100 calories and states she is following her eating plan approximately 50% of the time. [name] states she is exercising 0 minutes 0 times per week.  Today's visit was #: 5 Starting weight: 208 lbs Starting date: 04/05/2020 Today's weight: 205 lbs Today's date: 06/07/2020 Total lbs lost to date: 3 Total lbs lost since last in-office visit: 1  Interim History: She has been dealing with the following: two and a half weeks ago a heavy menstrual cycle started and [name] is still bleeding/passing clots. She is in perimenopause. She had a close friend that passed away from COVID-19. Their home kitchen is being renovated and they have been eating out more due to inability to cook at home. She and her husband are traveling to [place] this  Friday for 5 days. She was recently started on Wegovy 1.7mg  once weekly to replace once weekly Ozempic 1mg - she denies mass in neck, persistent hoarseness, dysphagia, dyspepsia when taking the GLP-1.  Subjective:   Mixed hyperlipidemia. 04/05/2020 lipid panel showed total cholesterol, triglycerides, and LDL all above goal. [name] denies cardiac symptoms and denies tobacco/vape use.   Lab Results  Component Value Date   CHOL 240 (H) 04/05/2020   HDL 51 04/05/2020   LDLCALC 149 (H) 04/05/2020   LDLDIRECT 194.4 09/10/2010   TRIG 220 (H) 04/05/2020   CHOLHDL 3.6 10/15/2019   Lab Results  Component Value Date   ALT 32 04/05/2020   AST 17 04/05/2020   ALKPHOS 82 04/05/2020   BILITOT 0.3 04/05/2020   The 10-year ASCVD risk score [name] Bussing DC Jr., et al., 2013) is: 1.6%   Values used to calculate the score:     Age: 49 years     Sex: Female     Is Non-Hispanic African American: No     Diabetic: No     Tobacco smoker: No     Systolic Blood Pressure:  106 mmHg     Is BP treated: Yes     HDL Cholesterol: 51 mg/dL     Total Cholesterol: 240 mg/dL  Insulin resistance. Kashmir has a diagnosis of insulin resistance based on her elevated fasting insulin level >5. She continues to work on diet and exercise to decrease her risk of diabetes. 04/05/2020 insulin level above goal at 25.0. A1c was within normal limits. [name] is on metformin 500 mg BID-started by Endocrinology for PCOS.  Lab Results  Component Value Date   INSULIN 25.0 (H) 04/05/2020   Lab Results  Component Value Date   HGBA1C 5.2 03/31/2020   Assessment/Plan:   Mixed hyperlipidemia. Cardiovascular risk and specific lipid/LDL goals reviewed.  We discussed several lifestyle modifications today and [name] will continue to work on diet, exercise and weight loss efforts. Orders and follow up as documented in patient record. She will continue to decrease saturated fat intake.  Counseling Intensive lifestyle modifications are the first line treatment for this issue. . Dietary changes: Increase soluble fiber. Decrease simple carbohydrates. . Exercise changes: Moderate to vigorous-intensity aerobic activity 150 minutes per week if tolerated. . Lipid-lowering medications: see documented in medical record.  Insulin resistance. [name] will continue to work on weight loss, exercise, and decreasing simple carbohydrates to help decrease the risk of diabetes. [name] agreed to follow-up with [place] as directed to closely monitor her progress.  She will continue metformin and will start Wegovy this week, which will replace Ozempic.  Class 1 obesity with serious comorbidity and body mass index (BMI) of 34.0 to 34.9 in adult, unspecified obesity type. [name] will start Wegovy 1.7 once a week, to replace weekly Ozempic 1 mg.    [name] is currently in the action stage of change. As such, her goal is to continue with weight loss efforts. She will [name] while traveling in [place]; Category 2 +  100 calories/journaling 450-600 calories and 40 grams of protein at dinner when home.   Exercise goals: No exercise has been prescribed at this time.  Behavioral modification strategies: increasing lean protein intake, decreasing simple carbohydrates, increasing water intake, meal planning and cooking strategies, better snacking choices, travel eating strategies, planning for success and keeping a strict food journal.  [name] has agreed to follow-up with our clinic in 2 weeks. She was informed of the importance of frequent follow-up visits to maximize her success with intensive lifestyle modifications for her multiple health conditions.   Objective:   Blood pressure 106/71, pulse 80, temperature 98.4 F (36.9 C), height 5\' 5"  (1.651 m), weight 205 lb (93 kg), SpO2 95 %. Body mass index is 34.11 kg/m.  General: Cooperative, alert, well developed, in no acute distress. HEENT: Conjunctivae and lids unremarkable. Cardiovascular: Regular rhythm.  Lungs: Normal work of breathing. Neurologic: No focal deficits.   Lab Results  Component Value Date   CREATININE 0.61 04/05/2020   BUN 14 04/05/2020   NA 141 04/05/2020   K 5.1 04/05/2020   CL 103 04/05/2020   CO2 24 04/05/2020   Lab Results  Component Value Date   ALT 32 04/05/2020   AST 17 04/05/2020   ALKPHOS 82 04/05/2020   BILITOT 0.3 04/05/2020   Lab Results  Component Value Date   HGBA1C 5.2 03/31/2020   HGBA1C 5.4 10/15/2019   HGBA1C 5.4 10/23/2018   HGBA1C 5.4 09/12/2017   HGBA1C 5.5 05/01/2015   Lab Results  Component Value Date   INSULIN 25.0 (H) 04/05/2020   Lab Results  Component Value Date   TSH 2.110 04/05/2020   Lab Results  Component Value Date   CHOL 240 (H) 04/05/2020   HDL 51 04/05/2020   LDLCALC 149 (H) 04/05/2020   LDLDIRECT 194.4 09/10/2010   TRIG 220 (H) 04/05/2020   CHOLHDL 3.6 10/15/2019   Lab Results  Component Value Date   WBC 10.1 04/05/2020   HGB 14.8 04/05/2020   HCT 43.6  04/05/2020   MCV 95 04/05/2020   PLT 331 04/05/2020   Lab Results  Component Value Date   IRON 91 05/01/2015   TIBC 376 05/01/2015   FERRITIN 60 06/27/2015   Attestation Statements:   Reviewed by clinician on day of visit: allergies, medications, problem list, medical history, surgical history, family history, social history, and previous encounter notes.  Time spent on visit including pre-visit chart review and post-visit charting and care was 27 minutes.   I, [name], am acting as Location manager for [employer], NP-C   I have reviewed the above documentation for accuracy and completeness, and I agree with the above. -  [name] d. [name] Gartland, NP-C

## 2020-06-21 ENCOUNTER — Ambulatory Visit (INDEPENDENT_AMBULATORY_CARE_PROVIDER_SITE_OTHER): Payer: No Typology Code available for payment source | Admitting: Adult Health

## 2020-06-21 ENCOUNTER — Encounter (INDEPENDENT_AMBULATORY_CARE_PROVIDER_SITE_OTHER): Payer: Self-pay | Admitting: Adult Health

## 2020-06-21 ENCOUNTER — Other Ambulatory Visit: Payer: Self-pay

## 2020-06-21 VITALS — BP 99/66 | HR 78 | Temp 98.0°F | Ht 65.0 in | Wt 207.0 lb

## 2020-06-21 DIAGNOSIS — E669 Obesity, unspecified: Secondary | ICD-10-CM | POA: Diagnosis not present

## 2020-06-21 DIAGNOSIS — E782 Mixed hyperlipidemia: Secondary | ICD-10-CM | POA: Diagnosis not present

## 2020-06-21 DIAGNOSIS — Z6834 Body mass index (BMI) 34.0-34.9, adult: Secondary | ICD-10-CM

## 2020-06-21 DIAGNOSIS — R7303 Prediabetes: Secondary | ICD-10-CM | POA: Diagnosis not present

## 2020-06-21 DIAGNOSIS — E8881 Metabolic syndrome: Secondary | ICD-10-CM

## 2020-06-22 NOTE — Progress Notes (Signed)
Chief Complaint:   [name] is here to discuss her progress with her obesity treatment plan along with follow-up of her obesity related diagnoses. [name] is on the Category 2 Plan + 100 calories and states she is following her eating plan approximately 30% of the time. [name] states she walked on vacation.   Today's visit was #: 6 Starting weight: 208 lbs Starting date: 04/05/2020 Today's weight: 207 lbs Today's date: 06/21/2020 Total lbs lost to date: 1 Total lbs lost since last in-office visit: 0  Interim History: [name] traveled [place] and focused on protein/vegetables or protein/fruit at each meal. She kept cocktails to the lowest calorie/sugar options. She walked and swam frequently [place]. Now that she is home, breakfast and lunch have been easy to follow on the Category 2 meal plan and dinner continues to be a challenge to follow dinner guidelines (her kitchen is still under renovation).  Subjective:   Prediabetes. [name] has a diagnosis of prediabetes based on her elevated HgA1c and was informed this puts her at greater risk of developing diabetes. She continues to work on diet and exercise to decrease her risk of diabetes. She denies nausea or hypoglycemia. 03/31/2020 A1c 5.2. [name] has been on metformin since 2020 and is managed by her PCP.  Lab Results  Component Value Date   HGBA1C 5.2 03/31/2020   Lab Results  Component Value Date   INSULIN 25.0 (H) 04/05/2020   Mixed hyperlipidemia. Lipid panel on 04/05/2020 showed total cholesterol, triglycerides, and LDL levels above goal.   Lab Results  Component Value Date   CHOL 240 (H) 04/05/2020   HDL 51 04/05/2020   LDLCALC 149 (H) 04/05/2020   LDLDIRECT 194.4 09/10/2010   TRIG 220 (H) 04/05/2020   CHOLHDL 3.6 10/15/2019   Lab Results  Component Value Date   ALT 32 04/05/2020   AST 17 04/05/2020   ALKPHOS 82 04/05/2020   BILITOT 0.3 04/05/2020    The 10-year ASCVD risk score [name], et al., 2013) is: 1.4%   Values used to calculate the score:     Age: 49 years     Sex: Female     Is Non-Hispanic African American: No     Diabetic: No     Tobacco smoker: No     Systolic Blood Pressure: 99 mmHg     Is BP treated: Yes     HDL Cholesterol: 51 mg/dL     Total Cholesterol: 240 mg/dL  Insulin resistance. [name] has a diagnosis of insulin resistance based on her elevated fasting insulin level >5. She continues to work on diet and exercise to decrease her risk of diabetes. 04/05/2020 insulin level 25.0. [name] is on metformin and GLP-1 and denies medication side effects.  Lab Results  Component Value Date   INSULIN 25.0 (H) 04/05/2020   Lab Results  Component Value Date   HGBA1C 5.2 03/31/2020   Assessment/Plan:   Prediabetes. [name] will continue to work on weight loss, exercise, and decreasing simple carbohydrates to help decrease the risk of diabetes. Labs will be checked at her next office visit.  Mixed hyperlipidemia. Cardiovascular risk and specific lipid/LDL goals reviewed.  We discussed several lifestyle modifications today and [name] will continue to work on diet, exercise and weight loss efforts. Orders and follow up as documented in patient record. Labs will be checked at her next office visit.  Counseling Intensive lifestyle modifications are the first line treatment for  this issue.  Dietary changes: Increase soluble fiber. Decrease simple carbohydrates.  Exercise changes: Moderate to vigorous-intensity aerobic activity 150 minutes per week if tolerated.  Lipid-lowering medications: see documented in medical record.  Insulin resistance. [name] will continue to work on weight loss, exercise, and decreasing simple carbohydrates to help decrease the risk of diabetes. [name] agreed to follow-up with [place] as directed to closely monitor her progress. Labs will be checked at her next office visit.  Class  1 obesity with serious comorbidity and body mass index (BMI) of 34.0 to 34.9 in adult, unspecified obesity type. [name] will continue Wegovy 1.7 mg - has been off for 2 weeks due to travel.  [name] is currently in the action stage of change. As such, her goal is to continue with weight loss efforts. She has agreed to the Category 2 Plan and will journal 400-500 calories and 40 grams of protein at supper.   Handouts were provided on Recipes and Thanksgiving.  Exercise goals: [name] will continue walking for exercise.  Behavioral modification strategies: increasing lean protein intake, decreasing simple carbohydrates, decreasing liquid calories, meal planning and cooking strategies, planning for success and keeping a strict food journal.  [name] has agreed to follow-up with our clinic fasting in 3 weeks. She was informed of the importance of frequent follow-up visits to maximize her success with intensive lifestyle modifications for her multiple health conditions.   Objective:   Blood pressure 99/66, pulse 78, temperature 98 F (36.7 C), height 5\' 5"  (1.651 m), weight 207 lb (93.9 kg), SpO2 96 %. Body mass index is 34.45 kg/m.  General: Cooperative, alert, well developed, in no acute distress. HEENT: Conjunctivae and lids unremarkable. Cardiovascular: Regular rhythm.  Lungs: Normal work of breathing. Neurologic: No focal deficits.   Lab Results  Component Value Date   CREATININE 0.61 04/05/2020   BUN 14 04/05/2020   NA 141 04/05/2020   K 5.1 04/05/2020   CL 103 04/05/2020   CO2 24 04/05/2020   Lab Results  Component Value Date   ALT 32 04/05/2020   AST 17 04/05/2020   ALKPHOS 82 04/05/2020   BILITOT 0.3 04/05/2020   Lab Results  Component Value Date   HGBA1C 5.2 03/31/2020   HGBA1C 5.4 10/15/2019   HGBA1C 5.4 10/23/2018   HGBA1C 5.4 09/12/2017   HGBA1C 5.5 05/01/2015   Lab Results  Component Value Date   INSULIN 25.0 (H) 04/05/2020   Lab Results  Component  Value Date   TSH 2.110 04/05/2020   Lab Results  Component Value Date   CHOL 240 (H) 04/05/2020   HDL 51 04/05/2020   LDLCALC 149 (H) 04/05/2020   LDLDIRECT 194.4 09/10/2010   TRIG 220 (H) 04/05/2020   CHOLHDL 3.6 10/15/2019   Lab Results  Component Value Date   WBC 10.1 04/05/2020   HGB 14.8 04/05/2020   HCT 43.6 04/05/2020   MCV 95 04/05/2020   PLT 331 04/05/2020   Lab Results  Component Value Date   IRON 91 05/01/2015   TIBC 376 05/01/2015   FERRITIN 60 06/27/2015   Attestation Statements:   Reviewed by clinician on day of visit: allergies, medications, problem list, medical history, surgical history, family history, social history, and previous encounter notes.  Time spent on visit including pre-visit chart review and post-visit charting and care was 28 minutes.   IMichaelene Song, am acting as Location manager for [employer], NP-C   I have reviewed the above documentation for accuracy and completeness, and I agree with  the above. -  Frieda Arnall d. Tasheena Wambolt, NP-C

## 2020-07-10 ENCOUNTER — Other Ambulatory Visit: Payer: Self-pay | Admitting: Physician Assistant

## 2020-07-10 DIAGNOSIS — F411 Generalized anxiety disorder: Secondary | ICD-10-CM

## 2020-07-11 ENCOUNTER — Ambulatory Visit (INDEPENDENT_AMBULATORY_CARE_PROVIDER_SITE_OTHER): Payer: No Typology Code available for payment source | Admitting: Adult Health

## 2020-07-12 ENCOUNTER — Other Ambulatory Visit: Payer: Self-pay | Admitting: Physician Assistant

## 2020-07-12 DIAGNOSIS — R7303 Prediabetes: Secondary | ICD-10-CM

## 2020-07-12 DIAGNOSIS — F411 Generalized anxiety disorder: Secondary | ICD-10-CM

## 2020-07-24 ENCOUNTER — Other Ambulatory Visit: Payer: Self-pay

## 2020-07-24 ENCOUNTER — Ambulatory Visit (INDEPENDENT_AMBULATORY_CARE_PROVIDER_SITE_OTHER): Payer: No Typology Code available for payment source | Admitting: Adult Health

## 2020-07-24 ENCOUNTER — Encounter (INDEPENDENT_AMBULATORY_CARE_PROVIDER_SITE_OTHER): Payer: Self-pay | Admitting: Adult Health

## 2020-07-24 VITALS — BP 108/74 | HR 85 | Temp 98.7°F | Ht 65.0 in | Wt 208.0 lb

## 2020-07-24 DIAGNOSIS — Z9189 Other specified personal risk factors, not elsewhere classified: Secondary | ICD-10-CM

## 2020-07-24 DIAGNOSIS — R7303 Prediabetes: Secondary | ICD-10-CM

## 2020-07-24 DIAGNOSIS — E669 Obesity, unspecified: Secondary | ICD-10-CM

## 2020-07-24 DIAGNOSIS — E559 Vitamin D deficiency, unspecified: Secondary | ICD-10-CM

## 2020-07-24 DIAGNOSIS — Z6834 Body mass index (BMI) 34.0-34.9, adult: Secondary | ICD-10-CM

## 2020-07-24 DIAGNOSIS — I10 Essential (primary) hypertension: Secondary | ICD-10-CM

## 2020-07-24 DIAGNOSIS — E782 Mixed hyperlipidemia: Secondary | ICD-10-CM

## 2020-07-24 MED ORDER — METFORMIN HCL 500 MG PO TABS: 1000.0000 mg | ORAL_TABLET | Freq: Every day | ORAL | 0 refills | Status: DC

## 2020-07-24 MED ORDER — SEMAGLUTIDE-WEIGHT MANAGEMENT 2.4 MG/0.75ML ~~LOC~~ SOAJ: 2.4000 mg | SUBCUTANEOUS | 0 refills | Status: DC

## 2020-07-24 NOTE — Progress Notes (Signed)
Chief Complaint:   [name] is here to discuss her progress with her obesity treatment plan along with follow-up of her obesity related diagnoses. [name] is on the Category 2 Plan and journaling and states she is following her eating plan approximately 0% of the time. [name] states she is exercising 0 minutes 0 times per week.  Today's visit was #: 7 Starting weight: 208 lbs Starting date: 04/05/2020 Today's weight: 208 lbs Today's date: 07/24/2020 Total lbs lost to date: 0 Total lbs lost since last in-office visit: 0  Interim History: [name]'s kitchen remodel was finished last week! She did well over Thanksgiving with protein and vegetables. The Christmas holiday parties and holiday treats have been a challenge. Her last dose of Wegovy did not absorb after the injection. She denies mass in neck, dyspepsia, dysphagia, or persistent hoarseness. [name] has been on Wegovy 1.7 once weekly dosage for 4 weeks.  Subjective:   Mixed hyperlipidemia. Lipid panel on 04/05/2020 showed total cholesterol 240, triglycerides 220, HDL 51, and LDL 149. [name] is not on statin therapy.   Lab Results  Component Value Date   CHOL 240 (H) 04/05/2020   HDL 51 04/05/2020   LDLCALC 149 (H) 04/05/2020   LDLDIRECT 194.4 09/10/2010   TRIG 220 (H) 04/05/2020   CHOLHDL 3.6 10/15/2019   Lab Results  Component Value Date   ALT 32 04/05/2020   AST 17 04/05/2020   ALKPHOS 82 04/05/2020   BILITOT 0.3 04/05/2020   The 10-year ASCVD risk score [name], et al., 2013) is: 1.8%   Values used to calculate the score:     Age: 11 years     Sex: Female     Is Non-Hispanic African American: No     Diabetic: No     Tobacco smoker: No     Systolic Blood Pressure: 169 mmHg     Is BP treated: Yes     HDL Cholesterol: 51 mg/dL     Total Cholesterol: 240 mg/dL  Essential hypertension. Blood pressure and heart rate are stable on today's office visit.   BP Readings from Last 3  Encounters:  07/24/20 108/74  06/21/20 99/66  06/07/20 106/71   Lab Results  Component Value Date   CREATININE 0.61 04/05/2020   CREATININE 0.66 10/15/2019   CREATININE 0.63 10/23/2018   Prediabetes. [name] has a diagnosis of prediabetes based on her elevated HgA1c and was informed this puts her at greater risk of developing diabetes. She continues to work on diet and exercise to decrease her risk of diabetes. She denies nausea or hypoglycemia. [name] is on metformin 500 mg 2 tabs with breakfast. Her PCP declined to refill metformin, refill will be provided today.  She is also on Wegovy-tolerating well.   Lab Results  Component Value Date   HGBA1C 5.2 03/31/2020   Lab Results  Component Value Date   INSULIN 25.0 (H) 04/05/2020   Vitamin D deficiency. Vitamin D level on 04/05/2020 was 47.5, just below goal of 50.   Ref. Range 04/05/2020 09:35  Vitamin D, 25-Hydroxy Latest Ref Range: 30.0 - 100.0 ng/mL 47.5   At risk for heart disease. [name] is at a higher than average risk for cardiovascular disease due to hyperlipidemia and obesity.   Assessment/Plan:   Mixed hyperlipidemia. Cardiovascular risk and specific lipid/LDL goals reviewed.  We discussed several lifestyle modifications today and [name] will continue to work on diet, exercise and weight loss efforts. Orders and follow up as documented  in patient record. Labs will be checked today.   Counseling Intensive lifestyle modifications are the first line treatment for this issue.  Dietary changes: Increase soluble fiber. Decrease simple carbohydrates.  Exercise changes: Moderate to vigorous-intensity aerobic activity 150 minutes per week if tolerated.   Lipid-lowering medications: see documented in medical record.   Essential hypertension. [name] is working on healthy weight loss and exercise to improve blood pressure control. We will watch for signs of hypotension as she continues her lifestyle modifications. Labs  will be checked today.   Prediabetes. [name] will continue to work on weight loss, exercise, and decreasing simple carbohydrates to help decrease the risk of diabetes. Refill was given for metFORMIN (GLUCOPHAGE) 500 MG tablet 2 tabs with breakfast, #60 with 0 refills. Labs will be checked today.   Vitamin D deficiency. Low Vitamin D level contributes to fatigue and are associated with obesity, breast, and colon cancer. VITAMIN D 25 Hydroxy (Vit-D Deficiency, Fractures) level will be checked today.   At risk for heart disease. [name] was given approximately 15 minutes of coronary artery disease prevention counseling today. She is 49 y.o. female and has risk factors for heart disease including obesity. We discussed intensive lifestyle modifications today with an emphasis on specific weight loss instructions and strategies.   Repetitive spaced learning was employed today to elicit superior memory formation and behavioral change.   Class 1 obesity with serious comorbidity and body mass index (BMI) of 34.0 to 34.9 in adult, unspecified obesity type. Refill was given for Wegovy 2.4 mg once a week, dispense 3 mL with 0 refills.  [name] is currently in the action stage of change. As such, her goal is to continue with weight loss efforts. She has agreed to the Category 2 Plan and will journal 400-500 calories and 40 grams of protein at supper.   Exercise goals: No exercise has been prescribed at this time.  Behavioral modification strategies: increasing lean protein intake, no skipping meals, meal planning and cooking strategies, planning for success and keeping a strict food journal.  [name] has agreed to follow-up with our clinic in 3 weeks. She was informed of the importance of frequent follow-up visits to maximize her success with intensive lifestyle modifications for her multiple health conditions.   [name] was informed we would discuss her lab results at her next visit unless there is a  critical issue that needs to be addressed sooner. [name] agreed to keep her next visit at the agreed upon time to discuss these results.  Objective:   Blood pressure 108/74, pulse 85, temperature 98.7 F (37.1 C), height 5\' 5"  (1.651 m), weight 208 lb (94.3 kg), SpO2 97 %. Body mass index is 34.61 kg/m.  General: Cooperative, alert, well developed, in no acute distress. HEENT: Conjunctivae and lids unremarkable. Cardiovascular: Regular rhythm.  Lungs: Normal work of breathing. Neurologic: No focal deficits.   Lab Results  Component Value Date   CREATININE 0.61 04/05/2020   BUN 14 04/05/2020   NA 141 04/05/2020   K 5.1 04/05/2020   CL 103 04/05/2020   CO2 24 04/05/2020   Lab Results  Component Value Date   ALT 32 04/05/2020   AST 17 04/05/2020   ALKPHOS 82 04/05/2020   BILITOT 0.3 04/05/2020   Lab Results  Component Value Date   HGBA1C 5.2 03/31/2020   HGBA1C 5.4 10/15/2019   HGBA1C 5.4 10/23/2018   HGBA1C 5.4 09/12/2017   HGBA1C 5.5 05/01/2015   Lab Results  Component Value Date  INSULIN 25.0 (H) 04/05/2020   Lab Results  Component Value Date   TSH 2.110 04/05/2020   Lab Results  Component Value Date   CHOL 240 (H) 04/05/2020   HDL 51 04/05/2020   LDLCALC 149 (H) 04/05/2020   LDLDIRECT 194.4 09/10/2010   TRIG 220 (H) 04/05/2020   CHOLHDL 3.6 10/15/2019   Lab Results  Component Value Date   WBC 10.1 04/05/2020   HGB 14.8 04/05/2020   HCT 43.6 04/05/2020   MCV 95 04/05/2020   PLT 331 04/05/2020   Lab Results  Component Value Date   IRON 91 05/01/2015   TIBC 376 05/01/2015   FERRITIN 60 06/27/2015   Attestation Statements:   Reviewed by clinician on day of visit: allergies, medications, problem list, medical history, surgical history, family history, social history, and previous encounter notes.  I, Michaelene Song, am acting as Location manager for [employer], NP-C   I have reviewed the above documentation for accuracy and completeness, and I  agree with the above. -  Chanan Detwiler d. Sagar Tengan, NP-C

## 2020-07-27 LAB — COMPREHENSIVE METABOLIC PANEL
ALT: 17 IU/L (ref 0–32)
AST: 14 IU/L (ref 0–40)
Albumin/Globulin Ratio: 1.8 (ref 1.2–2.2)
Albumin: 4.7 g/dL (ref 3.8–4.9)
Alkaline Phosphatase: 75 IU/L (ref 44–121)
BUN/Creatinine Ratio: 14 (ref 9–23)
BUN: 8 mg/dL (ref 6–24)
Bilirubin Total: 0.3 mg/dL (ref 0.0–1.2)
CO2: 22 mmol/L (ref 20–29)
Calcium: 9.5 mg/dL (ref 8.7–10.2)
Chloride: 101 mmol/L (ref 96–106)
Creatinine, Ser: 0.56 mg/dL — ABNORMAL LOW (ref 0.57–1.00)
GFR calc Af Amer: 125 mL/min/{1.73_m2} (ref >59)
GFR calc non Af Amer: 108 mL/min/{1.73_m2} (ref >59)
Globulin, Total: 2.6 g/dL (ref 1.5–4.5)
Glucose: 93 mg/dL (ref 65–99)
Potassium: 4.8 mmol/L (ref 3.5–5.2)
Sodium: 140 mmol/L (ref 134–144)
Total Protein: 7.3 g/dL (ref 6.0–8.5)

## 2020-07-27 LAB — LIPID PANEL
Chol/HDL Ratio: 4.2 ratio (ref 0.0–4.4)
Cholesterol, Total: 242 mg/dL — ABNORMAL HIGH (ref 100–199)
HDL: 57 mg/dL (ref >39)
LDL Chol Calc (NIH): 136 mg/dL — ABNORMAL HIGH (ref 0–99)
Triglycerides: 274 mg/dL — ABNORMAL HIGH (ref 0–149)
VLDL Cholesterol Cal: 49 mg/dL — ABNORMAL HIGH (ref 5–40)

## 2020-07-27 LAB — INSULIN, RANDOM: INSULIN: 12.5 u[IU]/mL (ref 2.6–24.9)

## 2020-07-27 LAB — HEMOGLOBIN A1C
Est. average glucose Bld gHb Est-mCnc: 108 mg/dL
Hgb A1c MFr Bld: 5.4 % (ref 4.8–5.6)

## 2020-07-27 LAB — VITAMIN D 25 HYDROXY (VIT D DEFICIENCY, FRACTURES): Vit D, 25-Hydroxy: 37 ng/mL (ref 30.0–100.0)

## 2020-08-13 ENCOUNTER — Encounter (INDEPENDENT_AMBULATORY_CARE_PROVIDER_SITE_OTHER): Payer: Self-pay | Admitting: Adult Health

## 2020-08-13 ENCOUNTER — Other Ambulatory Visit: Payer: Self-pay | Admitting: Physician Assistant

## 2020-08-13 DIAGNOSIS — E6609 Other obesity due to excess calories: Secondary | ICD-10-CM

## 2020-08-13 DIAGNOSIS — E8881 Metabolic syndrome: Secondary | ICD-10-CM

## 2020-08-13 DIAGNOSIS — Z6836 Body mass index (BMI) 36.0-36.9, adult: Secondary | ICD-10-CM

## 2020-08-14 NOTE — Telephone Encounter (Signed)
I am not sure why this was sent to me

## 2020-08-15 ENCOUNTER — Encounter (INDEPENDENT_AMBULATORY_CARE_PROVIDER_SITE_OTHER): Payer: Self-pay

## 2020-08-15 ENCOUNTER — Other Ambulatory Visit: Payer: Self-pay

## 2020-08-15 ENCOUNTER — Encounter (INDEPENDENT_AMBULATORY_CARE_PROVIDER_SITE_OTHER): Payer: Self-pay | Admitting: Adult Health

## 2020-08-15 ENCOUNTER — Telehealth (INDEPENDENT_AMBULATORY_CARE_PROVIDER_SITE_OTHER): Payer: Self-pay

## 2020-08-15 ENCOUNTER — Telehealth (INDEPENDENT_AMBULATORY_CARE_PROVIDER_SITE_OTHER): Payer: No Typology Code available for payment source | Admitting: Adult Health

## 2020-08-15 DIAGNOSIS — E669 Obesity, unspecified: Secondary | ICD-10-CM

## 2020-08-15 DIAGNOSIS — Z6834 Body mass index (BMI) 34.0-34.9, adult: Secondary | ICD-10-CM

## 2020-08-15 DIAGNOSIS — E782 Mixed hyperlipidemia: Secondary | ICD-10-CM

## 2020-08-15 DIAGNOSIS — R7303 Prediabetes: Secondary | ICD-10-CM | POA: Diagnosis not present

## 2020-08-15 MED ORDER — SEMAGLUTIDE-WEIGHT MANAGEMENT 2.4 MG/0.75ML ~~LOC~~ SOAJ: 2.4000 mg | SUBCUTANEOUS | 0 refills | Status: DC

## 2020-08-15 NOTE — Telephone Encounter (Signed)
I connected with  [name] on 08/15/20 by a video enabled telemedicine application and verified that I am speaking with the correct person using two identifiers.   I discussed the limitations of evaluation and management by telemedicine. The patient expressed understanding and agreed to proceed.

## 2020-08-16 NOTE — Progress Notes (Signed)
TeleHealth Visit:  Due to the COVID-19 pandemic, this visit was completed with telemedicine (audio/video) technology to reduce patient and provider exposure as well as to preserve personal protective equipment.   Marielena has verbally consented to this TeleHealth visit. The patient is located at home, the provider is located at the [facility] office. The participants in this visit include the listed provider and patient. The visit was conducted today via video.  Chief Complaint: OBESITY Anuja is here to discuss her progress with her obesity treatment plan along with follow-up of her obesity related diagnoses. Arii is on the Category 2 Plan and states she is following her eating plan approximately 40% of the time. Rebecah states she is exercising  0 minutes 0 times per week.  Today's visit was #: 8 Starting weight: 208 lbs Starting date: 04/05/2020  Interim History: On 08-28-2020 [name] developed fatigue, headache, joint pain, and slight pharyngitis. She did not get tested for SARS-CoV-s due to multiple family members testing positive after full COVID-19 exposure. She will have antigen test 08/18/2019 due to traveling to [place] to attend a funeral-  her mother-in-law passed away on 2020-08-28. Sondra has tolerated the increase in Wegovy 2.4 mg once a week. She denies mass in neck, dysphagia, dyspepsia, or persistent hoarseness.   Subjective:   1. Mixed hyperlipidemia Leidi has hyperlipidemia and has been trying to improve her cholesterol levels with intensive lifestyle modification including a low saturated fat diet, exercise and weight loss. She denies any chest pain, claudication or myalgias. 07/24/2020 lipid panel resulted a total cholesterol of 242, triglycerides 274, HDL 57, and LDL 136. ASCVD risk stratification 1.6%. Her HDL and LDL have improved, but her total cholesterol and triglycerides are worsening. Discussed labs with patient today.  Lab Results   Component Value Date   ALT 17 07/24/2020   AST 14 07/24/2020   ALKPHOS 75 07/24/2020   BILITOT 0.3 07/24/2020   Lab Results  Component Value Date   CHOL 242 (H) 07/24/2020   HDL 57 07/24/2020   LDLCALC 136 (H) 07/24/2020   LDLDIRECT 194.4 09/10/2010   TRIG 274 (H) 07/24/2020   CHOLHDL 4.2 07/24/2020   The 10-year ASCVD risk score [name], et al., 2013) is: 1.6%   Values used to calculate the score:     Age: 63 years     Sex: Female     Is Non-Hispanic African American: No     Diabetic: No     Tobacco smoker: No     Systolic Blood Pressure: 123XX123 mmHg     Is BP treated: Yes     HDL Cholesterol: 57 mg/dL     Total Cholesterol: 242 mg/dL   2. Pre-diabetes Aldia has a diagnosis of prediabetes based on her elevated HgA1c and was informed this puts her at greater risk of developing diabetes. She continues to work on diet and exercise to decrease her risk of diabetes. She denies nausea or hypoglycemia. 07/24/2020 blood glucose 93, A1c 5.4, and insulin level 12.5. Her insulin level is improving. Loeta is on Metformin 500 mg 2 tablets with breakfast and Wegovy. Discussed labs with patient today.  Lab Results  Component Value Date   HGBA1C 5.4 07/24/2020   Lab Results  Component Value Date   INSULIN 12.5 07/24/2020   INSULIN 25.0 (H) 04/05/2020    Assessment/Plan:   1. Mixed hyperlipidemia Cardiovascular risk and specific lipid/LDL goals reviewed.  We discussed several lifestyle modifications today and Keliyah will continue to  work on diet, exercise and weight loss efforts. Orders and follow up as documented in patient record. Recheck labs every 3 months. Emphasize lifestyle changes.  Counseling Intensive lifestyle modifications are the first line treatment for this issue. . Dietary changes: Increase soluble fiber. Decrease simple carbohydrates. . Exercise changes: Moderate to vigorous-intensity aerobic activity 150 minutes per week if tolerated. . Lipid-lowering  medications: see documented in medical record.   2. Pre-diabetes Armilla will continue to work on weight loss, exercise, and decreasing simple carbohydrates to help decrease the risk of diabetes. Continue current treatment plan of Metformin and Wegovy as directed.  3. Class 1 obesity with serious comorbidity and body mass index (BMI) of 34.0 to 34.9 in adult, unspecified obesity type Nathalia is currently in the action stage of change. As such, her goal is to continue with weight loss efforts. She has agreed to the Category 2 Plan.   We will refill Wegovy 2.4 mg once a week for a 1 month supply.  Exercise goals: No exercise has been prescribed at this time.  Behavioral modification strategies: increasing lean protein intake, no skipping meals, meal planning and cooking strategies, travel eating strategies and planning for success.  Blair has agreed to follow-up with our clinic in 2 weeks. She was informed of the importance of frequent follow-up visits to maximize her success with intensive lifestyle modifications for her multiple health conditions.  Objective:   VITALS: Per patient if applicable, see vitals. GENERAL: Alert and in no acute distress. CARDIOPULMONARY: No increased WOB. Speaking in clear sentences.  PSYCH: Pleasant and cooperative. Speech normal rate and rhythm. Affect is appropriate. Insight and judgement are appropriate. Attention is focused, linear, and appropriate.  NEURO: Oriented as arrived to appointment on time with no prompting.   Lab Results  Component Value Date   CREATININE 0.56 (L) 07/24/2020   BUN 8 07/24/2020   NA 140 07/24/2020   K 4.8 07/24/2020   CL 101 07/24/2020   CO2 22 07/24/2020   Lab Results  Component Value Date   ALT 17 07/24/2020   AST 14 07/24/2020   ALKPHOS 75 07/24/2020   BILITOT 0.3 07/24/2020   Lab Results  Component Value Date   HGBA1C 5.4 07/24/2020   HGBA1C 5.2 03/31/2020   HGBA1C 5.4 10/15/2019   HGBA1C 5.4 10/23/2018    HGBA1C 5.4 09/12/2017   Lab Results  Component Value Date   INSULIN 12.5 07/24/2020   INSULIN 25.0 (H) 04/05/2020   Lab Results  Component Value Date   TSH 2.110 04/05/2020   Lab Results  Component Value Date   CHOL 242 (H) 07/24/2020   HDL 57 07/24/2020   LDLCALC 136 (H) 07/24/2020   LDLDIRECT 194.4 09/10/2010   TRIG 274 (H) 07/24/2020   CHOLHDL 4.2 07/24/2020   Lab Results  Component Value Date   WBC 10.1 04/05/2020   HGB 14.8 04/05/2020   HCT 43.6 04/05/2020   MCV 95 04/05/2020   PLT 331 04/05/2020   Lab Results  Component Value Date   IRON 91 05/01/2015   TIBC 376 05/01/2015   FERRITIN 60 06/27/2015    Attestation Statements:   Reviewed by clinician on day of visit: allergies, medications, problem list, medical history, surgical history, family history, social history, and previous encounter notes.  Time spent on visit including pre-visit chart review and post-visit charting and care was 35 minutes.   Coral Ceo, am acting as Location manager for Mina Marble, NP.  I have reviewed the above documentation for accuracy  and completeness, and I agree with the above. - Javad Salva d. Jleigh Striplin, NP-C

## 2020-08-25 ENCOUNTER — Encounter (INDEPENDENT_AMBULATORY_CARE_PROVIDER_SITE_OTHER): Payer: Self-pay | Admitting: Adult Health

## 2020-08-29 ENCOUNTER — Telehealth (INDEPENDENT_AMBULATORY_CARE_PROVIDER_SITE_OTHER): Payer: No Typology Code available for payment source | Admitting: Adult Health

## 2020-08-29 ENCOUNTER — Encounter (INDEPENDENT_AMBULATORY_CARE_PROVIDER_SITE_OTHER): Payer: Self-pay

## 2020-08-29 ENCOUNTER — Encounter (INDEPENDENT_AMBULATORY_CARE_PROVIDER_SITE_OTHER): Payer: Self-pay | Admitting: Adult Health

## 2020-08-29 ENCOUNTER — Other Ambulatory Visit: Payer: Self-pay

## 2020-08-29 DIAGNOSIS — E669 Obesity, unspecified: Secondary | ICD-10-CM

## 2020-08-29 DIAGNOSIS — I10 Essential (primary) hypertension: Secondary | ICD-10-CM

## 2020-08-29 DIAGNOSIS — R7303 Prediabetes: Secondary | ICD-10-CM

## 2020-08-29 DIAGNOSIS — Z6834 Body mass index (BMI) 34.0-34.9, adult: Secondary | ICD-10-CM | POA: Diagnosis not present

## 2020-08-31 NOTE — Progress Notes (Signed)
TeleHealth Visit:  Due to the COVID-19 pandemic, this visit was completed with telemedicine (audio/video) technology to reduce patient and provider exposure as well as to preserve personal protective equipment.   [name] has verbally consented to this TeleHealth visit. The patient is located at home, the provider is located at the [facility] office. The participants in this visit include the listed provider and patient. The visit was conducted today via video.   Chief Complaint: OBESITY [name] is here to discuss her progress with her obesity treatment plan along with follow-up of her obesity related diagnoses. [name] is on the Category 2 Plan and states she is following her eating plan approximately 50% of the time. [name] states she is exercising 0 minutes 0 times per week.  Today's visit was #: 9 Starting weight: 208 lbs Starting date: 04/05/2020  Interim History: [name]'s home scale weight today is 204 lbs. She is on Wegovy 2.4 mg once a week. She denies mass in neck, polyphagia, polydipsia, or persistent hoarseness. She recently returned from [place] for a family funeral.    Subjective:   1. Essential hypertension Ambulatory blood pressure is 128/65. [name] is on Bystolic 5 mg daily.  BP Readings from Last 3 Encounters:  07/24/20 108/74  06/21/20 99/66  06/07/20 106/71    2. Pre-diabetes 03/31/2020 A1c was 5.2, elevated insulin level 25.0 on 04/05/2020. [name] is on Metformin 500 mg 2 tablets at breakfast. She has been experiencing late afternoon polyphagia.  Lab Results  Component Value Date   HGBA1C 5.4 07/24/2020   Lab Results  Component Value Date   INSULIN 12.5 07/24/2020   INSULIN 25.0 (H) 04/05/2020    Assessment/Plan:   1. Essential hypertension [name] is working on healthy weight loss and exercise to improve blood pressure control. We will watch for signs of hypotension as she continues her lifestyle modifications. Continue  Bystolic 5 mg daily and increase regular exercise.  2. Pre-diabetes [name] will continue to work on weight loss, exercise, and decreasing simple carbohydrates to help decrease the risk of diabetes. Split Metformin 500 mg dose- one tablet with breakfast and one tablet with lunch, to reduce late afternoon polphagia.  3. Class 1 obesity with serious comorbidity and body mass index (BMI) of 34.0 to 34.9 in adult, unspecified obesity type [name] is currently in the action stage of change. As such, her goal is to continue with weight loss efforts. She has agreed to the Category 2 Plan.   Split Metformin dose between breakfast and lunch. Recommend clio bars for snacks, as they are higher in protein content than other desserts.  Exercise goals: Increase daily walking and resume regular exercise at the gym  Behavioral modification strategies: increasing lean protein intake, decreasing simple carbohydrates and ways to avoid night time snacking.  [name] has agreed to follow-up with our clinic in 2 weeks. She was informed of the importance of frequent follow-up visits to maximize her success with intensive lifestyle modifications for her multiple health conditions.  Objective:   VITALS: Per patient if applicable, see vitals. GENERAL: Alert and in no acute distress. CARDIOPULMONARY: No increased WOB. Speaking in clear sentences.  PSYCH: Pleasant and cooperative. Speech normal rate and rhythm. Affect is appropriate. Insight and judgement are appropriate. Attention is focused, linear, and appropriate.  NEURO: Oriented as arrived to appointment on time with no prompting.   Lab Results  Component Value Date   CREATININE 0.56 (L) 07/24/2020   BUN 8 07/24/2020   NA 140 07/24/2020  K 4.8 07/24/2020   CL 101 07/24/2020   CO2 22 07/24/2020   Lab Results  Component Value Date   ALT 17 07/24/2020   AST 14 07/24/2020   ALKPHOS 75 07/24/2020   BILITOT 0.3 07/24/2020   Lab Results  Component  Value Date   HGBA1C 5.4 07/24/2020   HGBA1C 5.2 03/31/2020   HGBA1C 5.4 10/15/2019   HGBA1C 5.4 10/23/2018   HGBA1C 5.4 09/12/2017   Lab Results  Component Value Date   INSULIN 12.5 07/24/2020   INSULIN 25.0 (H) 04/05/2020   Lab Results  Component Value Date   TSH 2.110 04/05/2020   Lab Results  Component Value Date   CHOL 242 (H) 07/24/2020   HDL 57 07/24/2020   LDLCALC 136 (H) 07/24/2020   LDLDIRECT 194.4 09/10/2010   TRIG 274 (H) 07/24/2020   CHOLHDL 4.2 07/24/2020   Lab Results  Component Value Date   WBC 10.1 04/05/2020   HGB 14.8 04/05/2020   HCT 43.6 04/05/2020   MCV 95 04/05/2020   PLT 331 04/05/2020   Lab Results  Component Value Date   IRON 91 05/01/2015   TIBC 376 05/01/2015   FERRITIN 60 06/27/2015    Attestation Statements:   Reviewed by clinician on day of visit: allergies, medications, problem list, medical history, surgical history, family history, social history, and previous encounter notes.  Time spent on visit including pre-visit chart review and post-visit charting and care was 32 minutes.   Coral Ceo, am acting as Location manager for Mina Marble, NP.  I have reviewed the above documentation for accuracy and completeness, and I agree with the above. - Nissa Stannard d. Adalynn Corne, NP-C

## 2020-09-11 ENCOUNTER — Ambulatory Visit (INDEPENDENT_AMBULATORY_CARE_PROVIDER_SITE_OTHER): Payer: No Typology Code available for payment source | Admitting: Adult Health

## 2020-09-11 ENCOUNTER — Encounter (INDEPENDENT_AMBULATORY_CARE_PROVIDER_SITE_OTHER): Payer: Self-pay | Admitting: Adult Health

## 2020-09-12 ENCOUNTER — Other Ambulatory Visit: Payer: Self-pay | Admitting: Physician Assistant

## 2020-09-12 DIAGNOSIS — F411 Generalized anxiety disorder: Secondary | ICD-10-CM

## 2020-09-12 NOTE — Telephone Encounter (Signed)
Last written 07/11/2020 #30 with 2 refills Last appt 03/31/2020

## 2020-09-25 ENCOUNTER — Encounter (INDEPENDENT_AMBULATORY_CARE_PROVIDER_SITE_OTHER): Payer: Self-pay

## 2020-09-25 ENCOUNTER — Other Ambulatory Visit: Payer: Self-pay

## 2020-09-25 ENCOUNTER — Ambulatory Visit (INDEPENDENT_AMBULATORY_CARE_PROVIDER_SITE_OTHER): Payer: No Typology Code available for payment source | Admitting: Adult Health

## 2020-09-25 ENCOUNTER — Encounter (INDEPENDENT_AMBULATORY_CARE_PROVIDER_SITE_OTHER): Payer: Self-pay | Admitting: Adult Health

## 2020-10-03 ENCOUNTER — Other Ambulatory Visit (INDEPENDENT_AMBULATORY_CARE_PROVIDER_SITE_OTHER): Payer: Self-pay | Admitting: Adult Health

## 2020-10-03 DIAGNOSIS — E669 Obesity, unspecified: Secondary | ICD-10-CM

## 2020-10-03 MED ORDER — SEMAGLUTIDE-WEIGHT MANAGEMENT 2.4 MG/0.75ML ~~LOC~~ SOAJ: 2.4000 mg | SUBCUTANEOUS | 0 refills | Status: DC

## 2020-10-10 ENCOUNTER — Other Ambulatory Visit: Payer: Self-pay | Admitting: Nurse Practitioner

## 2020-10-10 ENCOUNTER — Other Ambulatory Visit (INDEPENDENT_AMBULATORY_CARE_PROVIDER_SITE_OTHER): Payer: Self-pay | Admitting: Adult Health

## 2020-10-10 DIAGNOSIS — R7303 Prediabetes: Secondary | ICD-10-CM

## 2020-10-23 ENCOUNTER — Encounter (INDEPENDENT_AMBULATORY_CARE_PROVIDER_SITE_OTHER): Payer: Self-pay | Admitting: Family Medicine

## 2020-10-23 ENCOUNTER — Other Ambulatory Visit: Payer: Self-pay

## 2020-10-23 ENCOUNTER — Telehealth (INDEPENDENT_AMBULATORY_CARE_PROVIDER_SITE_OTHER): Payer: No Typology Code available for payment source | Admitting: Family Medicine

## 2020-10-23 DIAGNOSIS — Z6834 Body mass index (BMI) 34.0-34.9, adult: Secondary | ICD-10-CM

## 2020-10-23 DIAGNOSIS — E785 Hyperlipidemia, unspecified: Secondary | ICD-10-CM

## 2020-10-23 DIAGNOSIS — R7303 Prediabetes: Secondary | ICD-10-CM | POA: Diagnosis not present

## 2020-10-23 DIAGNOSIS — E7849 Other hyperlipidemia: Secondary | ICD-10-CM | POA: Diagnosis not present

## 2020-10-23 DIAGNOSIS — E669 Obesity, unspecified: Secondary | ICD-10-CM | POA: Diagnosis not present

## 2020-10-23 MED ORDER — SEMAGLUTIDE-WEIGHT MANAGEMENT 2.4 MG/0.75ML ~~LOC~~ SOAJ
2.4000 mg | SUBCUTANEOUS | 0 refills | Status: DC
Start: 2020-10-23 — End: 2020-12-13

## 2020-10-23 MED ORDER — METFORMIN HCL 500 MG PO TABS: 1000.0000 mg | ORAL_TABLET | Freq: Every day | ORAL | 0 refills | Status: DC

## 2020-10-25 NOTE — Progress Notes (Signed)
TeleHealth Visit:  Due to the COVID-19 pandemic, this visit was completed with telemedicine (audio/video) technology to reduce patient and provider exposure as well as to preserve personal protective equipment.   Marnesha has verbally consented to this TeleHealth visit. The patient is located at home, the provider is located at the [facility] office. The participants in this visit include the listed provider and patient. The visit was conducted today via video.   Chief Complaint: OBESITY Renalda is here to discuss her progress with her obesity treatment plan along with follow-up of her obesity related diagnoses. Morelia is on the Category 2 Plan and states she is following her eating plan approximately 50% of the time. Javona states she is doing 0 minutes 0 times per week.  Today's visit was #: 10 Starting weight: 208 lbs Starting date: 04/05/2020  Interim History: Pt and her family were in [place] last week and relaxed the entire time. She had COVID over New Years and is doing well currently. She has no plans for the next few weeks. Pt is to continue category 2 for the next few weeks. Her biggest obstacle is nighttime snacking- more habitual than hunger.  Subjective:   1. Pre-diabetes Pt's A1c was 5.4 at last check, with an insulin level of 12.5 (down from 25.0). She is on Metformin 1000 mg daily.  Lab Results  Component Value Date   HGBA1C 5.4 07/24/2020   Lab Results  Component Value Date   INSULIN 12.5 07/24/2020   INSULIN 25.0 (H) 04/05/2020    2. Other hyperlipidemia Pt's triglycerides were elevated at last draw to 274. Her LDL 136 and HDL 57. She is not on statin therapy.  Lab Results  Component Value Date   ALT 17 07/24/2020   AST 14 07/24/2020   ALKPHOS 75 07/24/2020   BILITOT 0.3 07/24/2020   Lab Results  Component Value Date   CHOL 242 (H) 07/24/2020   HDL 57 07/24/2020   LDLCALC 136 (H) 07/24/2020   LDLDIRECT 194.4 09/10/2010   TRIG  274 (H) 07/24/2020   CHOLHDL 4.2 07/24/2020    Assessment/Plan:   1. Pre-diabetes Chelesea will continue to work on weight loss, exercise, and decreasing simple carbohydrates to help decrease the risk of diabetes.   - metFORMIN (GLUCOPHAGE) 500 MG tablet; Take 2 tablets (1,000 mg total) by mouth daily with breakfast.  Dispense: 60 tablet; Refill: 0  2. Other hyperlipidemia Cardiovascular risk and specific lipid/LDL goals reviewed.  We discussed several lifestyle modifications today and Henny will continue to work on diet, exercise and weight loss efforts. Orders and follow up as documented in patient record. Repeat labs in 2 months.  Counseling Intensive lifestyle modifications are the first line treatment for this issue. . Dietary changes: Increase soluble fiber. Decrease simple carbohydrates. . Exercise changes: Moderate to vigorous-intensity aerobic activity 150 minutes per week if tolerated. . Lipid-lowering medications: see documented in medical record.  3. Class 1 obesity with serious comorbidity and body mass index (BMI) of 34.0 to 34.9 in adult, unspecified obesity type Latiesha is currently in the action stage of change. As such, her goal is to continue with weight loss efforts. She has agreed to the Category 2 Plan.   We discussed various medication options to help [facility] with her weight loss efforts and we both agreed to continue Semaglutide, as per below. - Semaglutide-Weight Management 2.4 MG/0.75ML SOAJ; Inject 2.4 mg into the skin once a week.  Dispense: 3 mL; Refill: 0  Exercise  goals: All adults should avoid inactivity. Some physical activity is better than none, and adults who participate in any amount of physical activity gain some health benefits.  Behavioral modification strategies: increasing lean protein intake, meal planning and cooking strategies, keeping healthy foods in the home and planning for success.  Judithe has agreed to follow-up with our clinic  in 2 weeks. She was informed of the importance of frequent follow-up visits to maximize her success with intensive lifestyle modifications for her multiple health conditions.  Objective:   VITALS: Per patient if applicable, see vitals. GENERAL: Alert and in no acute distress. CARDIOPULMONARY: No increased WOB. Speaking in clear sentences.  PSYCH: Pleasant and cooperative. Speech normal rate and rhythm. Affect is appropriate. Insight and judgement are appropriate. Attention is focused, linear, and appropriate.  NEURO: Oriented as arrived to appointment on time with no prompting.   Lab Results  Component Value Date   CREATININE 0.56 (L) 07/24/2020   BUN 8 07/24/2020   NA 140 07/24/2020   K 4.8 07/24/2020   CL 101 07/24/2020   CO2 22 07/24/2020   Lab Results  Component Value Date   ALT 17 07/24/2020   AST 14 07/24/2020   ALKPHOS 75 07/24/2020   BILITOT 0.3 07/24/2020   Lab Results  Component Value Date   HGBA1C 5.4 07/24/2020   HGBA1C 5.2 03/31/2020   HGBA1C 5.4 10/15/2019   HGBA1C 5.4 10/23/2018   HGBA1C 5.4 09/12/2017   Lab Results  Component Value Date   INSULIN 12.5 07/24/2020   INSULIN 25.0 (H) 04/05/2020   Lab Results  Component Value Date   TSH 2.110 04/05/2020   Lab Results  Component Value Date   CHOL 242 (H) 07/24/2020   HDL 57 07/24/2020   LDLCALC 136 (H) 07/24/2020   LDLDIRECT 194.4 09/10/2010   TRIG 274 (H) 07/24/2020   CHOLHDL 4.2 07/24/2020   Lab Results  Component Value Date   WBC 10.1 04/05/2020   HGB 14.8 04/05/2020   HCT 43.6 04/05/2020   MCV 95 04/05/2020   PLT 331 04/05/2020   Lab Results  Component Value Date   IRON 91 05/01/2015   TIBC 376 05/01/2015   FERRITIN 60 06/27/2015    Attestation Statements:   Reviewed by clinician on day of visit: allergies, medications, problem list, medical history, surgical history, family history, social history, and previous encounter notes.  Coral Ceo, am acting as transcriptionist  for Coralie Common, MD.   I have reviewed the above documentation for accuracy and completeness, and I agree with the above. - Jinny Blossom, MD

## 2020-11-06 ENCOUNTER — Other Ambulatory Visit: Payer: Self-pay

## 2020-11-06 ENCOUNTER — Encounter (INDEPENDENT_AMBULATORY_CARE_PROVIDER_SITE_OTHER): Payer: Self-pay | Admitting: Family Medicine

## 2020-11-06 ENCOUNTER — Ambulatory Visit (INDEPENDENT_AMBULATORY_CARE_PROVIDER_SITE_OTHER): Payer: No Typology Code available for payment source | Admitting: Family Medicine

## 2020-11-06 VITALS — BP 109/72 | HR 79 | Temp 98.2°F | Ht 65.0 in | Wt 204.0 lb

## 2020-11-06 DIAGNOSIS — E7849 Other hyperlipidemia: Secondary | ICD-10-CM | POA: Diagnosis not present

## 2020-11-06 DIAGNOSIS — R7303 Prediabetes: Secondary | ICD-10-CM | POA: Diagnosis not present

## 2020-11-06 DIAGNOSIS — I493 Ventricular premature depolarization: Secondary | ICD-10-CM | POA: Diagnosis not present

## 2020-11-06 DIAGNOSIS — Z6834 Body mass index (BMI) 34.0-34.9, adult: Secondary | ICD-10-CM

## 2020-11-06 DIAGNOSIS — E669 Obesity, unspecified: Secondary | ICD-10-CM

## 2020-11-07 ENCOUNTER — Other Ambulatory Visit: Payer: Self-pay | Admitting: Physician Assistant

## 2020-11-07 DIAGNOSIS — I493 Ventricular premature depolarization: Secondary | ICD-10-CM

## 2020-11-14 NOTE — Progress Notes (Signed)
Chief Complaint:   OBESITY [name] is here to discuss her progress with her obesity treatment plan along with follow-up of her obesity related diagnoses.   Today's visit was #: 11 Starting weight: 208 lbs Starting date: 04/05/2020 Today's weight: 204 lbs Today's date: 11/06/2020 Total lbs lost to date: 4 lbs Body mass index is 33.95 kg/m.  Total weight loss percentage to date: -1.92%  Interim History:  [name] says that she does well with breakfast, lunch, and snacks.  For dinner, she eats what her [place] husband/family eats sometimes.  Difficult to give up bread, pasta, etc.  Plan:  Do not weigh self.  Eat all proteins on plan.  I reminded her that she can eat food at various times throughout the day.  Current Meal Plan: the Category 2 Plan for 50% of the time.  Current Exercise Plan: Walking for 10-15 minutes 7 times per week. Current Anti-Obesity Medications: semaglutide 2.4 mg subcutaneously weekly. Side effects: None.  Assessment/Plan:   1. Prediabetes At goal. Goal is HgbA1c < 5.7.  Medication: metformin 1,000 mg twice daily.    Plan:  She will continue to focus on protein-rich, low simple carbohydrate foods. We reviewed the importance of hydration, regular exercise for stress reduction, and restorative sleep.   Lab Results  Component Value Date   HGBA1C 5.4 07/24/2020   Lab Results  Component Value Date   INSULIN 12.5 07/24/2020   INSULIN 25.0 (H) 04/05/2020   2. Other hyperlipidemia Course: Not at goal. Lipid-lowering medications: None.   Plan: Dietary changes: Increase soluble fiber, decrease simple carbohydrates, decrease saturated fat. Exercise changes: Moderate to vigorous-intensity aerobic activity 150 minutes per week or as tolerated. We will continue to monitor along with PCP/specialists as it pertains to her weight loss journey.  Lab Results  Component Value Date   CHOL 242 (H) 07/24/2020   HDL 57 07/24/2020   LDLCALC 136 (H) 07/24/2020    LDLDIRECT 194.4 09/10/2010   TRIG 274 (H) 07/24/2020   CHOLHDL 4.2 07/24/2020   Lab Results  Component Value Date   ALT 17 07/24/2020   AST 14 07/24/2020   ALKPHOS 75 07/24/2020   BILITOT 0.3 07/24/2020   The 10-year ASCVD risk score [name], et al., 2013) is: 1.6%   Values used to calculate the score:     Age: 37 years     Sex: Female     Is Non-Hispanic African American: No     Diabetic: No     Tobacco smoker: No     Systolic Blood Pressure: 353 mmHg     Is BP treated: Yes     HDL Cholesterol: 57 mg/dL     Total Cholesterol: 242 mg/dL  3. PVC (premature ventricular contraction) Asymptomatic.  No concerns.  She is taking Bystolic 5 mg daily.  Plan:  Tolerating medication well.  Symptoms are stable.  Continue medication per Cardiology.  4. Class 1 obesity with serious comorbidity and body mass index (BMI) of 34.0 to 34.9 in adult, unspecified obesity type  Course: [name] is currently in the action stage of change. As such, her goal is to continue with weight loss efforts.   Nutrition goals: She has agreed to the Category 2 Plan with breakfast options.   Exercise goals: As is.  Behavioral modification strategies: increasing lean protein intake, decreasing simple carbohydrates, no skipping meals, meal planning and cooking strategies, keeping healthy foods in the home and planning for success.  Suttyn has agreed to follow-up with  our clinic in 2-3 weeks. She was informed of the importance of frequent follow-up visits to maximize her success with intensive lifestyle modifications for her multiple health conditions.   Objective:   Blood pressure 109/72, pulse 79, temperature 98.2 F (36.8 C), height 5\' 5"  (1.651 m), weight 204 lb (92.5 kg), SpO2 94 %. Body mass index is 33.95 kg/m.  General: Cooperative, alert, well developed, in no acute distress. HEENT: Conjunctivae and lids unremarkable. Cardiovascular: Regular rhythm.  Lungs: Normal work of  breathing. Neurologic: No focal deficits.   Lab Results  Component Value Date   CREATININE 0.56 (L) 07/24/2020   BUN 8 07/24/2020   NA 140 07/24/2020   K 4.8 07/24/2020   CL 101 07/24/2020   CO2 22 07/24/2020   Lab Results  Component Value Date   ALT 17 07/24/2020   AST 14 07/24/2020   ALKPHOS 75 07/24/2020   BILITOT 0.3 07/24/2020   Lab Results  Component Value Date   HGBA1C 5.4 07/24/2020   HGBA1C 5.2 03/31/2020   HGBA1C 5.4 10/15/2019   HGBA1C 5.4 10/23/2018   HGBA1C 5.4 09/12/2017   Lab Results  Component Value Date   INSULIN 12.5 07/24/2020   INSULIN 25.0 (H) 04/05/2020   Lab Results  Component Value Date   TSH 2.110 04/05/2020   Lab Results  Component Value Date   CHOL 242 (H) 07/24/2020   HDL 57 07/24/2020   LDLCALC 136 (H) 07/24/2020   LDLDIRECT 194.4 09/10/2010   TRIG 274 (H) 07/24/2020   CHOLHDL 4.2 07/24/2020   Lab Results  Component Value Date   WBC 10.1 04/05/2020   HGB 14.8 04/05/2020   HCT 43.6 04/05/2020   MCV 95 04/05/2020   PLT 331 04/05/2020   Lab Results  Component Value Date   IRON 91 05/01/2015   TIBC 376 05/01/2015   FERRITIN 60 06/27/2015   Attestation Statements:   Reviewed by clinician on day of visit: allergies, medications, problem list, medical history, surgical history, family history, social history, and previous encounter notes.  Time spent on visit including pre-visit chart review and post-visit care and charting was 30 minutes.   I, Water quality scientist, CMA, am acting as Location manager for [employer], DO.  I have reviewed the above documentation for accuracy and completeness, and I agree with the above. Marjory Sneddon, D.O.  The [place] was signed into law in 2016 which includes the topic of electronic health records.  This provides immediate access to information in MyChart.  This includes consultation notes, operative notes, office notes, lab results and pathology reports.  If you have any  questions about what you read please let us know at your next visit so we can discuss your concerns and take corrective action if need be.  We are right here with you.

## 2020-11-19 ENCOUNTER — Encounter (INDEPENDENT_AMBULATORY_CARE_PROVIDER_SITE_OTHER): Payer: Self-pay

## 2020-11-20 ENCOUNTER — Ambulatory Visit (INDEPENDENT_AMBULATORY_CARE_PROVIDER_SITE_OTHER): Payer: No Typology Code available for payment source | Admitting: Family Medicine

## 2020-12-07 ENCOUNTER — Other Ambulatory Visit (INDEPENDENT_AMBULATORY_CARE_PROVIDER_SITE_OTHER): Payer: Self-pay | Admitting: Family Medicine

## 2020-12-07 DIAGNOSIS — E669 Obesity, unspecified: Secondary | ICD-10-CM

## 2020-12-07 NOTE — Telephone Encounter (Signed)
Mychart message sent.

## 2020-12-08 ENCOUNTER — Ambulatory Visit: Payer: No Typology Code available for payment source | Admitting: Physician Assistant

## 2020-12-12 ENCOUNTER — Ambulatory Visit: Payer: No Typology Code available for payment source | Admitting: Physician Assistant

## 2020-12-13 ENCOUNTER — Other Ambulatory Visit: Payer: Self-pay | Admitting: Physician Assistant

## 2020-12-13 ENCOUNTER — Other Ambulatory Visit: Payer: Self-pay

## 2020-12-13 ENCOUNTER — Ambulatory Visit (INDEPENDENT_AMBULATORY_CARE_PROVIDER_SITE_OTHER): Payer: No Typology Code available for payment source | Admitting: Family Medicine

## 2020-12-13 ENCOUNTER — Encounter (INDEPENDENT_AMBULATORY_CARE_PROVIDER_SITE_OTHER): Payer: Self-pay | Admitting: Family Medicine

## 2020-12-13 VITALS — BP 107/72 | HR 72 | Temp 98.1°F | Ht 65.0 in | Wt 203.0 lb

## 2020-12-13 DIAGNOSIS — K760 Fatty (change of) liver, not elsewhere classified: Secondary | ICD-10-CM | POA: Insufficient documentation

## 2020-12-13 DIAGNOSIS — R7303 Prediabetes: Secondary | ICD-10-CM | POA: Diagnosis not present

## 2020-12-13 DIAGNOSIS — E8881 Metabolic syndrome: Secondary | ICD-10-CM | POA: Diagnosis not present

## 2020-12-13 DIAGNOSIS — Z9189 Other specified personal risk factors, not elsewhere classified: Secondary | ICD-10-CM

## 2020-12-13 DIAGNOSIS — E669 Obesity, unspecified: Secondary | ICD-10-CM

## 2020-12-13 DIAGNOSIS — Z6834 Body mass index (BMI) 34.0-34.9, adult: Secondary | ICD-10-CM

## 2020-12-13 DIAGNOSIS — F411 Generalized anxiety disorder: Secondary | ICD-10-CM

## 2020-12-13 MED ORDER — METFORMIN HCL 500 MG PO TABS
1000.0000 mg | ORAL_TABLET | Freq: Every day | ORAL | 0 refills | Status: DC
Start: 2020-12-13 — End: 2020-12-28

## 2020-12-13 MED ORDER — OZEMPIC (2 MG/DOSE) 8 MG/3ML ~~LOC~~ SOPN: 2.0000 mg | PEN_INJECTOR | SUBCUTANEOUS | 0 refills | Status: DC

## 2020-12-21 NOTE — Progress Notes (Signed)
Chief Complaint:   OBESITY [name] is here to discuss her progress with her obesity treatment plan along with follow-up of her obesity related diagnoses.   Today's visit was #: 12 Starting weight: 208 lbs Starting date: 04/05/2020 Today's weight: 203 lbs Today's date: 12/13/2020 Weight change since last visit: 1 lb Total lbs lost to date: 5 lbs Body mass index is 33.78 kg/m.  Total weight loss percentage to date: -2.40%  Interim History:   [name]'s last office visit was on 11/06/2020.  She has lost 1 pound since then.  She hits breakfast and lunch, but then misses her proteins and dinner and eats a lot of carbs.  Current Meal Plan: the Category 2 Plan with breakfast options .  Current Exercise Plan: Walking for 10 minutes 4-5 times per week.  Current Anti-Obesity Medications: Wegovy 2.4 mg subcutaneously weekly. Side effects: None.  Assessment/Plan:   Medications Discontinued During This Encounter  Medication Reason  . Semaglutide-Weight Management 2.4 MG/0.75ML SOAJ Change in therapy  . metFORMIN (GLUCOPHAGE) 500 MG tablet Reorder     Meds ordered this encounter  Medications  . metFORMIN (GLUCOPHAGE) 500 MG tablet    Sig: Take 2 tablets (1,000 mg total) by mouth daily with breakfast.    Dispense:  60 tablet    Refill:  0  . Semaglutide, 2 MG/DOSE, (OZEMPIC, 2 MG/DOSE,) 8 MG/3ML SOPN    Sig: Inject 2 mg into the skin once a week.    Dispense:  3 mL    Refill:  0    1. Prediabetes At goal. Goal is HgbA1c < 5.7.  Medication: Wegovy 2.4 mg subcutaneously weekly  ( which pt has not taken since 10/31/20) and metformin 1,000 mg twice daily.    Plan:  Discontinue Wegovy and start Ozempic 2 mg subcutaneously weekly DUE TO COST issues per pt.  Improve dinner proteins.  Continue metformin.  She will continue to focus on protein-rich, low simple carbohydrate foods. We reviewed the importance of hydration, regular exercise for stress reduction, and restorative sleep.   Lab  Results  Component Value Date   HGBA1C 5.4 07/24/2020   Lab Results  Component Value Date   INSULIN 12.5 07/24/2020   INSULIN 25.0 (H) 04/05/2020   - Refill metFORMIN (GLUCOPHAGE) 500 MG tablet; Take 2 tablets (1,000 mg total) by mouth daily with breakfast.  Dispense: 60 tablet; Refill: 0 - Start Semaglutide, 2 MG/DOSE, (OZEMPIC, 2 MG/DOSE,) 8 MG/3ML SOPN; Inject 2 mg into the skin once a week.  Dispense: 3 mL; Refill: 0    2. Nonalcoholic hepatosteatosis She says she was told she had this by GI in the past.  Plan:  Start Ozempic 2 mg subcutaneously weekly, as per below.  NAFLD is an umbrella term that encompasses a disease spectrum that includes steatosis (fat) without inflammation, steatohepatitis (NASH; fat + inflammation in a characteristic pattern), and cirrhosis. Bland steatosis is felt to be a benign condition, with extremely low to no risk of progression to cirrhosis, whereas NASH can progress to cirrhosis. The mainstay of treatment of NAFLD includes lifestyle modification to achieve weight loss, at least 7% of current body weight. Low carbohydrate diets can be beneficial in improving NAFLD liver histology. Additionally, exercise, even the absence of weight loss can have beneficial effects on the patient's metabolic profile and liver health.   - Start Semaglutide, 2 MG/DOSE, (OZEMPIC, 2 MG/DOSE,) 8 MG/3ML SOPN; Inject 2 mg into the skin once a week.  Dispense: 3 mL; Refill: 0  3. Metabolic syndrome Starting goal: Lose 7-10% of starting weight. She will continue to focus on protein-rich, low simple carbohydrate foods- follow meal plan. We reviewed the importance of hydration, regular exercise for stress reduction, and restorative sleep.  We will continue to check lab work every 3 months, with 10% weight loss, or should any other concerns arise.  Plan:  Start Ozempic 2 mg subcutaneously, as per below.  - Start Semaglutide, 2 MG/DOSE, (OZEMPIC, 2 MG/DOSE,) 8 MG/3ML SOPN; Inject 2  mg into the skin once a week.  Dispense: 3 mL; Refill: 0    4. At risk for side effect of medication Due to [name]'s current conditions and medications, she is at a higher risk for drug side effect.  At least 8 minutes was spent on counseling her about these concerns today.  We discussed the benefits and potential risks of these medications, and all of patient's concerns were addressed and questions were answered.  she will call us, or their PCP or other specialists who treat their conditions with medications, with any questions or concerns that may develop.       5. Obesity, current BMI 33.8  - Start Semaglutide, 2 MG/DOSE, (OZEMPIC, 2 MG/DOSE,) 8 MG/3ML SOPN; Inject 2 mg into the skin once a week.  Dispense: 3 mL; Refill: 0  Course: [name] is currently in the action stage of change. As such, her goal is to continue with weight loss efforts.   Nutrition goals: She has agreed to the Category 2 Plan with breakfast options.   Exercise goals: As is.  Behavioral modification strategies: increasing lean protein intake, meal planning and cooking strategies and planning for success.  [name] has agreed to follow-up with our clinic in 2-3 weeks. She was informed of the importance of frequent follow-up visits to maximize her success with intensive lifestyle modifications for her multiple health conditions.   Objective:   Blood pressure 107/72, pulse 72, temperature 98.1 F (36.7 C), height 5\' 5"  (1.651 m), weight 203 lb (92.1 kg), SpO2 97 %. Body mass index is 33.78 kg/m.  General: Cooperative, alert, well developed, in no acute distress. HEENT: Conjunctivae and lids unremarkable. Cardiovascular: Regular rhythm.  Lungs: Normal work of breathing. Neurologic: No focal deficits.   Lab Results  Component Value Date   CREATININE 0.56 (L) 07/24/2020   BUN 8 07/24/2020   NA 140 07/24/2020   K 4.8 07/24/2020   CL 101 07/24/2020   CO2 22 07/24/2020   Lab Results  Component Value  Date   ALT 17 07/24/2020   AST 14 07/24/2020   ALKPHOS 75 07/24/2020   BILITOT 0.3 07/24/2020   Lab Results  Component Value Date   HGBA1C 5.4 07/24/2020   HGBA1C 5.2 03/31/2020   HGBA1C 5.4 10/15/2019   HGBA1C 5.4 10/23/2018   HGBA1C 5.4 09/12/2017   Lab Results  Component Value Date   INSULIN 12.5 07/24/2020   INSULIN 25.0 (H) 04/05/2020   Lab Results  Component Value Date   TSH 2.110 04/05/2020   Lab Results  Component Value Date   CHOL 242 (H) 07/24/2020   HDL 57 07/24/2020   LDLCALC 136 (H) 07/24/2020   LDLDIRECT 194.4 09/10/2010   TRIG 274 (H) 07/24/2020   CHOLHDL 4.2 07/24/2020   Lab Results  Component Value Date   WBC 10.1 04/05/2020   HGB 14.8 04/05/2020   HCT 43.6 04/05/2020   MCV 95 04/05/2020   PLT 331 04/05/2020   Lab Results  Component Value Date   IRON  91 05/01/2015   TIBC 376 05/01/2015   FERRITIN 60 06/27/2015   Attestation Statements:   Reviewed by clinician on day of visit: allergies, medications, problem list, medical history, surgical history, family history, social history, and previous encounter notes.  I, Water quality scientist, CMA, am acting as Location manager for [employer], DO.  I have reviewed the above documentation for accuracy and completeness, and I agree with the above. Marjory Sneddon, D.O.  The [place] was signed into law in 2016 which includes the topic of electronic health records.  This provides immediate access to information in MyChart.  This includes consultation notes, operative notes, office notes, lab results and pathology reports.  If you have any questions about what you read please let us know at your next visit so we can discuss your concerns and take corrective action if need be.  We are right here with you.

## 2020-12-26 ENCOUNTER — Other Ambulatory Visit: Payer: Self-pay

## 2020-12-26 ENCOUNTER — Ambulatory Visit (INDEPENDENT_AMBULATORY_CARE_PROVIDER_SITE_OTHER): Payer: No Typology Code available for payment source | Admitting: Physician Assistant

## 2020-12-26 ENCOUNTER — Encounter: Payer: Self-pay | Admitting: Physician Assistant

## 2020-12-26 VITALS — BP 115/80 | HR 76 | Ht 65.0 in | Wt 205.0 lb

## 2020-12-26 DIAGNOSIS — Z1231 Encounter for screening mammogram for malignant neoplasm of breast: Secondary | ICD-10-CM

## 2020-12-26 DIAGNOSIS — N938 Other specified abnormal uterine and vaginal bleeding: Secondary | ICD-10-CM | POA: Diagnosis not present

## 2020-12-26 DIAGNOSIS — E88819 Insulin resistance, unspecified: Secondary | ICD-10-CM

## 2020-12-26 DIAGNOSIS — L700 Acne vulgaris: Secondary | ICD-10-CM

## 2020-12-26 DIAGNOSIS — F411 Generalized anxiety disorder: Secondary | ICD-10-CM

## 2020-12-26 DIAGNOSIS — Z6834 Body mass index (BMI) 34.0-34.9, adult: Secondary | ICD-10-CM

## 2020-12-26 DIAGNOSIS — I493 Ventricular premature depolarization: Secondary | ICD-10-CM

## 2020-12-26 DIAGNOSIS — E6609 Other obesity due to excess calories: Secondary | ICD-10-CM

## 2020-12-26 DIAGNOSIS — R7303 Prediabetes: Secondary | ICD-10-CM

## 2020-12-26 DIAGNOSIS — R4589 Other symptoms and signs involving emotional state: Secondary | ICD-10-CM

## 2020-12-26 DIAGNOSIS — N921 Excessive and frequent menstruation with irregular cycle: Secondary | ICD-10-CM

## 2020-12-26 DIAGNOSIS — E8881 Metabolic syndrome: Secondary | ICD-10-CM

## 2020-12-26 MED ORDER — SERTRALINE HCL 50 MG PO TABS: 1.0000 | ORAL_TABLET | Freq: Every day | ORAL | 3 refills | Status: DC

## 2020-12-26 MED ORDER — ALPRAZOLAM 0.5 MG PO TABS: ORAL_TABLET | ORAL | 1 refills | Status: DC

## 2020-12-26 MED ORDER — NEBIVOLOL HCL 5 MG PO TABS: ORAL_TABLET | ORAL | 3 refills | Status: DC

## 2020-12-26 NOTE — Patient Instructions (Signed)
Abnormal Uterine Bleeding Abnormal uterine bleeding means bleeding more than usual from your womb (uterus). It can include:  Bleeding between menstrual periods.  Bleeding after sex.  Bleeding that is heavier than normal.  Menstrual periods that last longer than usual.  Bleeding after you have stopped having your menstrual period (menopause). There are many problems that may cause this. You should see a doctor for any kind of bleeding that is not normal. Treatment depends on the cause of the bleeding. Follow these instructions at home: Medicines  Take over-the-counter and prescription medicines only as told by your doctor.  Tell your doctor about other medicines that you take. ? If told by your doctor, stop taking aspirin or medicines that have aspirin in them. These medicines can make you bleed more.  You may be given iron pills to replace iron that your body loses because of this condition. Take them as told by your doctor. Managing constipation If you are taking iron pills, you may have trouble pooping (constipation). To prevent or treat trouble pooping, you may need to:  Drink enough fluid to keep your pee (urine) pale yellow.  Take over-the-counter or prescription medicines.  Eat foods that are high in fiber. These include beans, whole grains, and fresh fruits and vegetables.  Limit foods that are high in fat and sugar. These include fried or sweet foods. General instructions  Watch your condition for any changes.  Do not use tampons, douche, or have sex, if your doctor tells you not to.  Change your pads often.  Get regular exams. This includes pelvic exams and cervical cancer screenings. ? It is up to you to get the results of any tests that are done. Ask your doctor, or the department that is doing the tests, when your results will be ready.  Keep all follow-up visits as told by your doctor. This is important. Contact a doctor if:  The bleeding lasts more than 1  week.  You feel dizzy at times.  You feel like you may vomit (nausea).  You vomit.  You feel light-headed or weak.  Your symptoms get worse. Get help right away if:  You pass out.  You have to change pads every hour.  You have pain in your belly.  You have a fever or chills.  You get sweaty.  You get weak.  You pass large blood clots from your vagina. Summary  Abnormal uterine bleeding means bleeding more than usual from your womb (uterus).  Any kind of bleeding that is not normal should be checked by a doctor.  Treatment depends on the cause of the bleeding.  Get help right away if you pass out, you have to change pads every hour, or you pass large blood clots from your vagina. This information is not intended to replace advice given to you by your health care provider. Make sure you discuss any questions you have with your health care provider. Document Revised: 06/01/2019 Document Reviewed: 06/01/2019 Elsevier Patient Education  2021 Elsevier Inc.  

## 2020-12-26 NOTE — Progress Notes (Addendum)
Subjective:    Patient ID: [name], female    DOB: March 16, 1971, 50 y.o.   MRN: [id number]  HPI  Patient is a 50 year old obese female with PVCs, anxiety, depression, OSA, prediabetes and history of dysfunctional uterine bleeding.  Patient was actually scheduled for D&C at 1 point for dysfunctional uterine bleeding but the bleeding stopped.  This past episode has been going on for 7 weeks.  Bleeding is heavy at times but then less severe.  She does have some intermittent lower abdominal cramping and fullness.  She denies any vaginal itching or discharge.  She denies painful intercourse.  Her last ultrasound was 2019.  She does admit to more breast heaviness and tenderness.  Her last Pap was 1 year ago.  She is not on any hormone replacement.  She does have family history significant for gynecological cancers such as breast, ovarian.    Requesting retin A for acne.  .. Active Ambulatory Problems    Diagnosis Date Noted  . Anxiety state 06/08/2007  . Essential hypertension 06/08/2007  . PALPITATIONS 04/26/2008  . Bilateral lower abdominal cramping 11/04/2007  . LUMBAR STRAIN 06/10/2007  . Breast mass, right 01/19/2014  . Sinus tachycardia 01/26/2014  . Hyperlipidemia 01/31/2014  . PVC's (premature ventricular contractions) 05/31/2014  . Weight gain 09/19/2014  . Other fatigue 09/19/2014  . Dry skin 09/19/2014  . OSA (obstructive sleep apnea) 04/03/2015  . DUB (dysfunctional uterine bleeding) 06/28/2015  . Family history of breast cancer 12/12/2015  . Family history of melanoma 12/12/2015  . Right thyroid nodule 01/29/2016  . Uveitis, intermediate 02/29/2016  . Mass in neck 05/21/2016  . Chronic fatigue syndrome 09/17/2016  . Obesity (BMI 35.0-39.9 without comorbidity) 09/19/2016  . Drug-induced constipation 11/18/2016  . Dyslipidemia (high LDL; low HDL) 01/13/2017  . Vitamin D deficiency 09/14/2017  . Prediabetes 02/13/2018  . Pelvic pain 02/13/2018  . Colon cancer  high risk 02/13/2018  . Insulin resistance 06/21/2018  . Depressed mood 10/27/2018  . Class 1 obesity with serious comorbidity and body mass index (BMI) of 34.0 to 34.9 in adult 10/27/2018  . Left foot pain 09/29/2019  . COVID-19 virus infection 11/02/2019  . Nonalcoholic hepatosteatosis 59/56/3875  . Metabolic syndrome 64/33/2951  . At risk for side effect of medication 12/13/2020  . Menorrhagia with irregular cycle 12/26/2020  . Class 1 obesity due to excess calories without serious comorbidity with body mass index (BMI) of 34.0 to 34.9 in adult 12/27/2020  . Acne vulgaris 12/27/2020   Resolved Ambulatory Problems    Diagnosis Date Noted  . Redness of left eye 12/12/2015   Past Medical History:  Diagnosis Date  . Anemia   . Anxiety   . Arthritis   . Back pain   . Gallbladder problem   . High cholesterol   . Hypertension   . Hypothyroidism   . IBS (irritable bowel syndrome)   . Joint pain   . Low vitamin D level   . Obesity (BMI 30-39.9)   . PCOS (polycystic ovarian syndrome)    .[name] Family History  Problem Relation Age of Onset  . Hyperlipidemia Mother   . Hypertension Mother   . Cancer Mother        breast cancer invasive/pancreatic cancer  . Pancreatic cancer Mother   . Pulmonary fibrosis Mother   . Anxiety disorder Mother   . Obesity Mother   . Cancer Father   . Alcohol abuse Father   . Alcoholism Father   . Cancer Sister  melanoma  . Alcohol abuse Other        family hx  . Arthritis Other        family hx  . Hypertension Other        family hx  . Stroke Other        <50 family hx  . Colitis Other      Review of Systems    see HPI.  Objective:   Physical Exam Vitals reviewed.  Constitutional:      Appearance: Normal appearance. She is obese.  HENT:     Head: Normocephalic.  Neck:     Comments: No thyroid tenderness or enlargement.  Cardiovascular:     Rate and Rhythm: Normal rate and regular rhythm.     Pulses: Normal pulses.   Pulmonary:     Effort: Pulmonary effort is normal.     Breath sounds: Normal breath sounds.  Abdominal:     General: Bowel sounds are normal. There is no distension.     Palpations: Abdomen is soft. There is no mass.     Tenderness: There is no abdominal tenderness. There is no right CVA tenderness, left CVA tenderness, guarding or rebound.  Musculoskeletal:     Right lower leg: No edema.     Left lower leg: No edema.  Lymphadenopathy:     Cervical: No cervical adenopathy.  Neurological:     General: No focal deficit present.     Mental Status: She is alert and oriented to person, place, and time.  Psychiatric:        Mood and Affect: Mood normal.          .. Depression screen [facility] 2/9 12/26/2020 04/05/2020 03/31/2020 09/29/2019 08/02/2019  Decreased Interest 0 2 0 0 0  Down, Depressed, Hopeless 0 2 0 0 0  PHQ - 2 Score 0 4 0 0 0  Altered sleeping 1 3 - 1 -  Tired, decreased energy 1 3 - 3 -  Change in appetite 0 3 - 2 -  Feeling bad or failure about yourself  0 2 - 0 -  Trouble concentrating 0 1 - 0 -  Moving slowly or fidgety/restless 0 1 - 0 -  Suicidal thoughts 0 0 - 0 -  PHQ-9 Score 2 17 - 6 -  Difficult doing work/chores Not difficult at all Somewhat difficult - Not difficult at all -   .. GAD 7 : Generalized Anxiety Score 12/26/2020 09/29/2019 08/02/2019 10/23/2018  Nervous, Anxious, on Edge 1 0 0 0  Control/stop worrying 1 0 0 0  Worry too much - different things 0 0 0 0  Trouble relaxing 0 1 0 0  Restless 0 1 0 0  Easily annoyed or irritable 1 1 0 0  Afraid - awful might happen 0 0 0 0  Total GAD 7 Score 3 3 0 0  Anxiety Difficulty Not difficult at all Not difficult at all Not difficult at all Not difficult at all     Assessment & Plan:  Marland KitchenMarland KitchenDynesha was seen today for vaginal bleeding.  Diagnoses and all orders for this visit:  DUB (dysfunctional uterine bleeding) -     US Pelvic Complete With Transvaginal; Future -     TSH -     CBC -     COMPLETE  METABOLIC PANEL WITH GFR  Anxiety state -     sertraline (ZOLOFT) 50 MG tablet; Take 1 tablet (50 mg total) by mouth daily. -     ALPRAZolam (  XANAX) 0.5 MG tablet; TAKE 1 TABLET BY MOUTH TWICE A DAY AS NEEDED FOR ANXIETY  PVC's (premature ventricular contractions) -     nebivolol (BYSTOLIC) 5 MG tablet; TAKE 1 TABLET DAILY  Visit for screening mammogram -     MM 3D SCREEN BREAST BILATERAL  Menorrhagia with irregular cycle  Depressed mood  Class 1 obesity due to excess calories without serious comorbidity with body mass index (BMI) of 34.0 to 34.9 in adult  Insulin resistance  Prediabetes  Acne vulgaris  Other orders -     tretinoin (RETIN-A) 0.1 % cream; Apply topically at bedtime.  hx of DUB. Canceled DNC due to bleeding stopping a few years ago. Periods are becoming more erratic. Pt is perimenopausal. Not on hormones.  Pap UTD per patient 1 year ago at [facility] will get records.  Recheck pelvic ultrasound.  Check TSH/CBC. No bleeding now. Consider progesterone to stop bleeding in the future.   PHQ/GAD numbers great. Refilled zoloft. Refilled xanax as needed. Discussed sparing use as to avoid dependence.   BP looks great. PVC well controlled. Continue bystolic.   Needs mammogram. Ordered today.   [name].Discussed low carb diet with 1500 calories and 80g of protein.  Exercising at least 150 minutes a week.  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  Continue to work with medical wellness.  On ozempic.   Retin topical for acne.

## 2020-12-27 ENCOUNTER — Ambulatory Visit (INDEPENDENT_AMBULATORY_CARE_PROVIDER_SITE_OTHER): Payer: No Typology Code available for payment source

## 2020-12-27 ENCOUNTER — Encounter: Payer: Self-pay | Admitting: Physician Assistant

## 2020-12-27 ENCOUNTER — Ambulatory Visit: Payer: No Typology Code available for payment source

## 2020-12-27 DIAGNOSIS — E6609 Other obesity due to excess calories: Secondary | ICD-10-CM | POA: Insufficient documentation

## 2020-12-27 DIAGNOSIS — L700 Acne vulgaris: Secondary | ICD-10-CM | POA: Insufficient documentation

## 2020-12-27 DIAGNOSIS — Z1231 Encounter for screening mammogram for malignant neoplasm of breast: Secondary | ICD-10-CM | POA: Diagnosis not present

## 2020-12-27 DIAGNOSIS — Z6832 Body mass index (BMI) 32.0-32.9, adult: Secondary | ICD-10-CM | POA: Insufficient documentation

## 2020-12-27 DIAGNOSIS — E66811 Obesity, class 1: Secondary | ICD-10-CM | POA: Insufficient documentation

## 2020-12-27 LAB — COMPLETE METABOLIC PANEL WITH GFR
AG Ratio: 1.8 (calc) (ref 1.0–2.5)
ALT: 14 U/L (ref 6–29)
AST: 13 U/L (ref 10–35)
Albumin: 4.3 g/dL (ref 3.6–5.1)
Alkaline phosphatase (APISO): 63 U/L (ref 37–153)
BUN: 16 mg/dL (ref 7–25)
CO2: 25 mmol/L (ref 20–32)
Calcium: 9.5 mg/dL (ref 8.6–10.4)
Chloride: 105 mmol/L (ref 98–110)
Creat: 0.51 mg/dL (ref 0.50–1.05)
GFR, Est African American: 129 mL/min/{1.73_m2} (ref >=60)
GFR, Est Non African American: 111 mL/min/{1.73_m2} (ref >=60)
Globulin: 2.4 g/dL (calc) (ref 1.9–3.7)
Glucose, Bld: 90 mg/dL (ref 65–99)
Potassium: 4.3 mmol/L (ref 3.5–5.3)
Sodium: 139 mmol/L (ref 135–146)
Total Bilirubin: 0.4 mg/dL (ref 0.2–1.2)
Total Protein: 6.7 g/dL (ref 6.1–8.1)

## 2020-12-27 LAB — CBC
HCT: 41.5 % (ref 35.0–45.0)
Hemoglobin: 14 g/dL (ref 11.7–15.5)
MCH: 31.3 pg (ref 27.0–33.0)
MCHC: 33.7 g/dL (ref 32.0–36.0)
MCV: 92.6 fL (ref 80.0–100.0)
MPV: 10.5 fL (ref 7.5–12.5)
Platelets: 374 10*3/uL (ref 140–400)
RBC: 4.48 10*6/uL (ref 3.80–5.10)
RDW: 12.8 % (ref 11.0–15.0)
WBC: 10.6 10*3/uL (ref 3.8–10.8)

## 2020-12-27 LAB — TSH: TSH: 2.82 mIU/L

## 2020-12-27 MED ORDER — TRETINOIN 0.1 % EX CREA
TOPICAL_CREAM | Freq: Every day | CUTANEOUS | 1 refills | Status: DC
Start: 2020-12-27 — End: 2021-07-03

## 2020-12-27 NOTE — Addendum Note (Signed)
Addended by: Donella Stade on: 12/27/2020 08:29 AM   Modules accepted: Orders

## 2020-12-27 NOTE — Progress Notes (Signed)
[name],   Thyroid looks great and stable.  Hemoglobin is amazing for bleeding for 7 weeks.  Kidneys and liver are perfect.

## 2020-12-28 ENCOUNTER — Encounter (INDEPENDENT_AMBULATORY_CARE_PROVIDER_SITE_OTHER): Payer: Self-pay | Admitting: Family Medicine

## 2020-12-28 ENCOUNTER — Other Ambulatory Visit: Payer: Self-pay

## 2020-12-28 ENCOUNTER — Ambulatory Visit (INDEPENDENT_AMBULATORY_CARE_PROVIDER_SITE_OTHER): Payer: No Typology Code available for payment source | Admitting: Family Medicine

## 2020-12-28 ENCOUNTER — Other Ambulatory Visit: Payer: Self-pay | Admitting: Neurology

## 2020-12-28 VITALS — BP 101/69 | HR 78 | Temp 98.5°F | Ht 65.0 in | Wt 201.0 lb

## 2020-12-28 DIAGNOSIS — R7303 Prediabetes: Secondary | ICD-10-CM | POA: Diagnosis not present

## 2020-12-28 DIAGNOSIS — Z6834 Body mass index (BMI) 34.0-34.9, adult: Secondary | ICD-10-CM

## 2020-12-28 DIAGNOSIS — E669 Obesity, unspecified: Secondary | ICD-10-CM | POA: Diagnosis not present

## 2020-12-28 DIAGNOSIS — Z9189 Other specified personal risk factors, not elsewhere classified: Secondary | ICD-10-CM

## 2020-12-28 DIAGNOSIS — K760 Fatty (change of) liver, not elsewhere classified: Secondary | ICD-10-CM

## 2020-12-28 MED ORDER — METFORMIN HCL 500 MG PO TABS: 1000.0000 mg | ORAL_TABLET | Freq: Every day | ORAL | 0 refills | Status: DC

## 2020-12-28 MED ORDER — OZEMPIC (2 MG/DOSE) 8 MG/3ML ~~LOC~~ SOPN: 2.0000 mg | PEN_INJECTOR | SUBCUTANEOUS | 0 refills | Status: DC

## 2020-12-29 ENCOUNTER — Other Ambulatory Visit: Payer: Self-pay | Admitting: Physician Assistant

## 2020-12-29 ENCOUNTER — Other Ambulatory Visit: Payer: No Typology Code available for payment source

## 2020-12-29 ENCOUNTER — Ambulatory Visit (INDEPENDENT_AMBULATORY_CARE_PROVIDER_SITE_OTHER): Payer: No Typology Code available for payment source

## 2020-12-29 DIAGNOSIS — N938 Other specified abnormal uterine and vaginal bleeding: Secondary | ICD-10-CM

## 2020-12-29 DIAGNOSIS — R928 Other abnormal and inconclusive findings on diagnostic imaging of breast: Secondary | ICD-10-CM

## 2020-12-29 NOTE — Progress Notes (Signed)
Calcifications of the left breast noted. Needs more imaging. Will be contacted by breast clinic.

## 2020-12-30 ENCOUNTER — Other Ambulatory Visit (INDEPENDENT_AMBULATORY_CARE_PROVIDER_SITE_OTHER): Payer: Self-pay | Admitting: Family Medicine

## 2020-12-30 DIAGNOSIS — K760 Fatty (change of) liver, not elsewhere classified: Secondary | ICD-10-CM

## 2020-12-30 DIAGNOSIS — R7303 Prediabetes: Secondary | ICD-10-CM

## 2021-01-01 DIAGNOSIS — Z9189 Other specified personal risk factors, not elsewhere classified: Secondary | ICD-10-CM | POA: Insufficient documentation

## 2021-01-01 NOTE — Telephone Encounter (Signed)
Pt last seen by Dr. Opalski.  

## 2021-01-02 ENCOUNTER — Encounter: Payer: Self-pay | Admitting: Physician Assistant

## 2021-01-02 DIAGNOSIS — D259 Leiomyoma of uterus, unspecified: Secondary | ICD-10-CM

## 2021-01-02 DIAGNOSIS — R9389 Abnormal findings on diagnostic imaging of other specified body structures: Secondary | ICD-10-CM

## 2021-01-02 DIAGNOSIS — N938 Other specified abnormal uterine and vaginal bleeding: Secondary | ICD-10-CM

## 2021-01-02 NOTE — Progress Notes (Signed)
[name],   Your endometrium lining is thickened to 67mm.  Have you had a biopsy recently of endometrial tissue.  Due to your age I think a biopsy to confirm no cancerous tissue is warranted.  I would like for you to have a follow up conversation with GYN. Where could we send this report to?  You have a small fibroid in uterus.

## 2021-01-03 NOTE — Progress Notes (Signed)
Chief Complaint:   OBESITY [name] is here to discuss her progress with her obesity treatment plan along with follow-up of her obesity related diagnoses.   Today's visit was #: 51 Starting weight: 208 lbs Starting date: 04/05/2020 Today's weight: 201 lbs Today's date: 12/28/2020 Weight change since last visit: 2 lbs Total lbs lost to date: 7 lbs Body mass index is 33.45 kg/m.  Total weight loss percentage to date: -3.37%  Interim History:  [name] is doing better with the dinner meal and getting more protein in and less carbs.  No issues, but not calculating snack calories.  Current Meal Plan: the Category 2 Plan with breakfast options.  Current Exercise Plan: None. Current Anti-Obesity Medications: Ozempic 2 mg subcutaneously weekly. Side effects: None.  Assessment/Plan:   Medications Discontinued During This Encounter  Medication Reason  . metFORMIN (GLUCOPHAGE) 500 MG tablet Reorder  . Semaglutide, 2 MG/DOSE, (OZEMPIC, 2 MG/DOSE,) 8 MG/3ML SOPN Reorder   Meds ordered this encounter  Medications  . metFORMIN (GLUCOPHAGE) 500 MG tablet    Sig: Take 2 tablets (1,000 mg total) by mouth daily with breakfast.    Dispense:  60 tablet    Refill:  0  . DISCONTD: Semaglutide, 2 MG/DOSE, (OZEMPIC, 2 MG/DOSE,) 8 MG/3ML SOPN    Sig: Inject 2 mg into the skin once a week.    Dispense:  3 mL    Refill:  0   1. Prediabetes At goal. Goal is HgbA1c < 5.7.  Medication: metformin 1,000 mg daily and Ozempic 2 mg subcutaneously weekly.  Started Ozempic at last office visit.  Tolerating well, without side effects.  Works well.  Plan:  Will refill metformin and Ozempic at current doses.  She will continue to focus on protein-rich, low simple carbohydrate foods. We reviewed the importance of hydration, regular exercise for stress reduction, and restorative sleep.   Lab Results  Component Value Date   HGBA1C 5.4 07/24/2020   Lab Results  Component Value Date   INSULIN 12.5  07/24/2020   INSULIN 25.0 (H) 04/05/2020   - Refill metFORMIN (GLUCOPHAGE) 500 MG tablet; Take 2 tablets (1,000 mg total) by mouth daily with breakfast.  Dispense: 60 tablet; Refill: 0 - Refill Semaglutide, 2 MG/DOSE, (OZEMPIC, 2 MG/DOSE,) 8 MG/3ML SOPN; Inject 2 mg into the skin once a week.  Dispense: 3 mL; Refill: 0  2. Nonalcoholic hepatosteatosis Will refill Ozempic today.  Will monitor liver enzymes.  NAFLD is an umbrella term that encompasses a disease spectrum that includes steatosis (fat) without inflammation, steatohepatitis (NASH; fat + inflammation in a characteristic pattern), and cirrhosis. Bland steatosis is felt to be a benign condition, with extremely low to no risk of progression to cirrhosis, whereas NASH can progress to cirrhosis. The mainstay of treatment of NAFLD includes lifestyle modification to achieve weight loss, at least 7% of current body weight. Low carbohydrate diets can be beneficial in improving NAFLD liver histology. Additionally, exercise, even the absence of weight loss can have beneficial effects on the patient's metabolic profile and liver health.   - Refill metFORMIN (GLUCOPHAGE) 500 MG tablet; Take 2 tablets (1,000 mg total) by mouth daily with breakfast.  Dispense: 60 tablet; Refill: 0 - Refill Semaglutide, 2 MG/DOSE, (OZEMPIC, 2 MG/DOSE,) 8 MG/3ML SOPN; Inject 2 mg into the skin once a week.  Dispense: 3 mL; Refill: 0  3. At risk for activity intolerance [name] was given approximately 10 minutes of counseling today regarding her increased risk for exercise intolerance.  We  discussed patient's specific personal and medical issues that raise our concern.  She was advised of strategies to prevent injury and ways to improve her cardiopulmonary fitness levels slowly over time.  We additionally discussed various fitness trackers and smart phone apps to help motivate the patient to stay on track.   4. Obesity, current BMI 33.5  Course: [name] is currently in  the action stage of change. As such, her goal is to continue with weight loss efforts.   Nutrition goals: She has agreed to the Category 2 Plan with breakfast options.   Exercise goals: Start walking for 10 minutes per day.  Behavioral modification strategies: increasing lean protein intake, better snacking choices and planning for success.  [name] has agreed to follow-up with our clinic in 2-3 weeks. She was informed of the importance of frequent follow-up visits to maximize her success with intensive lifestyle modifications for her multiple health conditions.   Objective:   Blood pressure 101/69, pulse 78, temperature 98.5 F (36.9 C), height 5\' 5"  (1.651 m), weight 201 lb (91.2 kg), SpO2 96 %. Body mass index is 33.45 kg/m.  General: Cooperative, alert, well developed, in no acute distress. HEENT: Conjunctivae and lids unremarkable. Cardiovascular: Regular rhythm.  Lungs: Normal work of breathing. Neurologic: No focal deficits.   Lab Results  Component Value Date   CREATININE 0.51 12/26/2020   BUN 16 12/26/2020   NA 139 12/26/2020   K 4.3 12/26/2020   CL 105 12/26/2020   CO2 25 12/26/2020   Lab Results  Component Value Date   ALT 14 12/26/2020   AST 13 12/26/2020   ALKPHOS 75 07/24/2020   BILITOT 0.4 12/26/2020   Lab Results  Component Value Date   HGBA1C 5.4 07/24/2020   HGBA1C 5.2 03/31/2020   HGBA1C 5.4 10/15/2019   HGBA1C 5.4 10/23/2018   HGBA1C 5.4 09/12/2017   Lab Results  Component Value Date   INSULIN 12.5 07/24/2020   INSULIN 25.0 (H) 04/05/2020   Lab Results  Component Value Date   TSH 2.82 12/26/2020   Lab Results  Component Value Date   CHOL 242 (H) 07/24/2020   HDL 57 07/24/2020   LDLCALC 136 (H) 07/24/2020   LDLDIRECT 194.4 09/10/2010   TRIG 274 (H) 07/24/2020   CHOLHDL 4.2 07/24/2020   Lab Results  Component Value Date   WBC 10.6 12/26/2020   HGB 14.0 12/26/2020   HCT 41.5 12/26/2020   MCV 92.6 12/26/2020   PLT 374 12/26/2020    Lab Results  Component Value Date   IRON 91 05/01/2015   TIBC 376 05/01/2015   FERRITIN 60 06/27/2015   Attestation Statements:   Reviewed by clinician on day of visit: allergies, medications, problem list, medical history, surgical history, family history, social history, and previous encounter notes.  I, Water quality scientist, CMA, am acting as Location manager for [employer], DO.  I have reviewed the above documentation for accuracy and completeness, and I agree with the above. Marjory Sneddon, D.O.  The [place] was signed into law in 2016 which includes the topic of electronic health records.  This provides immediate access to information in MyChart.  This includes consultation notes, operative notes, office notes, lab results and pathology reports.  If you have any questions about what you read please let us know at your next visit so we can discuss your concerns and take corrective action if need be.  We are right here with you.

## 2021-01-04 ENCOUNTER — Other Ambulatory Visit (INDEPENDENT_AMBULATORY_CARE_PROVIDER_SITE_OTHER): Payer: Self-pay | Admitting: Family Medicine

## 2021-01-04 ENCOUNTER — Telehealth: Payer: Self-pay | Admitting: Neurology

## 2021-01-04 DIAGNOSIS — K760 Fatty (change of) liver, not elsewhere classified: Secondary | ICD-10-CM

## 2021-01-04 DIAGNOSIS — R7303 Prediabetes: Secondary | ICD-10-CM

## 2021-01-04 NOTE — Telephone Encounter (Signed)
Prior Authorization for Tretinoin submitted via covermymeds.  This request has been approved using information available on the patient's profile. [id number];Review Type:Prior Auth;Coverage Start Date:12/05/2020;Coverage End Date:01/04/2022;

## 2021-01-04 NOTE — Telephone Encounter (Signed)
Last seen Opalski

## 2021-01-05 ENCOUNTER — Encounter (INDEPENDENT_AMBULATORY_CARE_PROVIDER_SITE_OTHER): Payer: Self-pay | Admitting: Family Medicine

## 2021-01-05 ENCOUNTER — Other Ambulatory Visit (INDEPENDENT_AMBULATORY_CARE_PROVIDER_SITE_OTHER): Payer: Self-pay | Admitting: Family Medicine

## 2021-01-05 DIAGNOSIS — R7303 Prediabetes: Secondary | ICD-10-CM

## 2021-01-05 DIAGNOSIS — K760 Fatty (change of) liver, not elsewhere classified: Secondary | ICD-10-CM

## 2021-01-09 NOTE — Telephone Encounter (Signed)
Pt last seen by Dr. Opalski.  

## 2021-01-10 ENCOUNTER — Other Ambulatory Visit (INDEPENDENT_AMBULATORY_CARE_PROVIDER_SITE_OTHER): Payer: Self-pay

## 2021-01-10 ENCOUNTER — Encounter (INDEPENDENT_AMBULATORY_CARE_PROVIDER_SITE_OTHER): Payer: Self-pay | Admitting: Family Medicine

## 2021-01-10 DIAGNOSIS — R7303 Prediabetes: Secondary | ICD-10-CM

## 2021-01-10 DIAGNOSIS — K760 Fatty (change of) liver, not elsewhere classified: Secondary | ICD-10-CM

## 2021-01-10 MED ORDER — OZEMPIC (2 MG/DOSE) 8 MG/3ML ~~LOC~~ SOPN: 2.0000 mg | PEN_INJECTOR | SUBCUTANEOUS | 0 refills | Status: DC

## 2021-01-10 NOTE — Telephone Encounter (Signed)
Please see previous message sent to patient.

## 2021-01-17 ENCOUNTER — Ambulatory Visit: Payer: No Typology Code available for payment source | Admitting: Obstetrics & Gynecology

## 2021-01-17 ENCOUNTER — Other Ambulatory Visit (HOSPITAL_COMMUNITY)
Admission: RE | Admit: 2021-01-17 | Discharge: 2021-01-17 | Disposition: A | Discharge status: Home / Self Care | Payer: No Typology Code available for payment source | Source: Ambulatory Visit | Attending: Obstetrics & Gynecology | Admitting: Obstetrics & Gynecology

## 2021-01-17 ENCOUNTER — Encounter: Payer: Self-pay | Admitting: Obstetrics & Gynecology

## 2021-01-17 ENCOUNTER — Other Ambulatory Visit: Payer: Self-pay

## 2021-01-17 VITALS — BP 106/73 | HR 77 | Resp 16 | Ht 65.0 in | Wt 207.0 lb

## 2021-01-17 DIAGNOSIS — R9389 Abnormal findings on diagnostic imaging of other specified body structures: Secondary | ICD-10-CM

## 2021-01-17 DIAGNOSIS — Z803 Family history of malignant neoplasm of breast: Secondary | ICD-10-CM | POA: Diagnosis not present

## 2021-01-17 DIAGNOSIS — Z124 Encounter for screening for malignant neoplasm of cervix: Secondary | ICD-10-CM | POA: Diagnosis not present

## 2021-01-17 DIAGNOSIS — N926 Irregular menstruation, unspecified: Secondary | ICD-10-CM | POA: Insufficient documentation

## 2021-01-17 DIAGNOSIS — Z01812 Encounter for preprocedural laboratory examination: Secondary | ICD-10-CM | POA: Diagnosis not present

## 2021-01-17 LAB — POCT URINE PREGNANCY: Preg Test, Ur: NEGATIVE

## 2021-01-17 NOTE — Progress Notes (Addendum)
50 y.o. [id number] Married White or Caucasian female here for discussion of recent ultrasound and recommendations regarding changes in bleeding.  Pt was referred from [facility], [place].  Pt reports having more irregular cycles with prolonged bleeding during the last year.  She had a cycle from mid March to the end of April this year.  Bleeding was never heavy but just wouldn't stop.  Has not cycled since that time.  Aware she is 49 and cycles typically start changing.  However, ultrasound was obtained 12/29/20 (reviewed report and actual images) and endometrium was 33m even after six weeks of bleeding.  Discussed with pt will need to proceed with endometrial biopsy today.  Procedure, risks and benefits reviewed.  Pt does have strong family hx of cancers.  Did have genetic testing in 2017.  Reviewed Epic.  Cannot see actual testing.  She does appear to have a positive screen.  Was advised this increased her risk of GI cancers.  She has seen GI.  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  This gene typically does increase breast cancer risk.  Pt has MRI once.  Current mammogram was abnormal and she has follow up scheduled 01/29/2021.  Will attempt to get actual genetic testing results as pt may need more regular breast MRI for high risk screening.  01/22/2021 Addendum:  Received genetic testing.  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  Testing done 06/14/2016.  Will be scanned into Epic.  TNewtonmodel done with pt today.  Mother with hx of breast cancer, PGM with hx of breast cancer, PAunt with hx of ovarian cancer.  Lifetime risk is 37.7%.  Breast MRI recommended as well as consideration of high risk breast clinic consultation.  Will proceed with breast MRI in 6 months if upcoming breast biopsy is negative.  Does not want referral to high risk breast clinic at this time.  Patient's last menstrual period was 11/22/2020.          Sexually active: Yes.    The current method of  family planning is vasectomy.    Smoker:  no   reports that she has quit smoking. She has never used smokeless tobacco. She reports current alcohol use. She reports that she does not use drugs.  Past Medical History:  Diagnosis Date   Anemia    Anxiety    Arthritis    Left Foot   Back pain    Chronic fatigue syndrome    Gallbladder problem    High cholesterol    Hyperlipidemia    Hypertension    Hypothyroidism    IBS (irritable bowel syndrome)    Insulin resistance    Joint pain    Low vitamin D level    Obesity (BMI 30-39.9)    Palpitations    PCOS (polycystic ovarian syndrome)    PVC's (premature ventricular contractions)    Vitamin D deficiency     Past Surgical History:  Procedure Laterality Date   CHOLECYSTECTOMY      Current Outpatient Medications  Medication Sig Dispense Refill   ALPRAZolam (XANAX) 0.5 MG tablet TAKE 1 TABLET BY MOUTH TWICE A DAY AS NEEDED FOR ANXIETY 60 tablet 1   Cholecalciferol (VITAMIN D) 125 MCG (5000 UT) CAPS Take 5,000 Units by mouth daily.      metFORMIN (GLUCOPHAGE) 500 MG tablet Take 2 tablets (1,000 mg total) by mouth daily with breakfast. 60 tablet 0   nebivolol (BYSTOLIC) 5 MG tablet TAKE  1 TABLET DAILY 90 tablet 3   Semaglutide, 2 MG/DOSE, (OZEMPIC, 2 MG/DOSE,) 8 MG/3ML SOPN Inject 2 mg into the skin once a week. 3 mL 0   sertraline (ZOLOFT) 50 MG tablet Take 1 tablet (50 mg total) by mouth daily. 90 tablet 3   tretinoin (RETIN-A) 0.1 % cream Apply topically at bedtime. 45 g 1   No current facility-administered medications for this visit.    Family History  Problem Relation Age of Onset   Hyperlipidemia Mother    Hypertension Mother    Cancer Mother        breast cancer invasive/pancreatic cancer   Pancreatic cancer Mother    Pulmonary fibrosis Mother    Anxiety disorder Mother    Obesity Mother    Cancer Father    Alcohol abuse Father    Alcoholism Father    Cancer Sister        melanoma   Alcohol abuse Other         family hx   Arthritis Other        family hx   Hypertension Other        family hx   Stroke Other        <50 family hx   Colitis Other     Review of Systems  Genitourinary: Positive for menstrual problem.  All other systems reviewed and are negative.   Exam:   BP 106/73   Pulse 77   Resp 16   Ht 5' 5" (1.651 m)   Wt 207 lb (93.9 kg)   LMP 11/22/2020   BMI 34.45 kg/m   Height: 5' 5" (165.1 cm)  General appearance: alert, cooperative and appears stated age Head: Normocephalic, without obvious abnormality, atraumatic Extremities: extremities normal, atraumatic, no cyanosis or edema Skin: Skin color, texture, turgor normal. No rashes or lesions Lymph nodes: Cervical, supraclavicular, and axillary nodes normal. No abnormal inguinal nodes palpated Neurologic: Grossly normal  Pelvic: External genitalia:  no lesions              Urethra:  normal appearing urethra with no masses, tenderness or lesions              Bartholins and Skenes: normal                 Vagina: normal appearing vagina with normal color and no discharge, no lesions              Cervix: no lesions              Pap taken: Yes.   Bimanual Exam:  Uterus:  normal size, contour, position, consistency, mobility, non-tender              Adnexa: normal adnexa and no mass, fullness, tenderness               Rectovaginal: Confirms               Anus:  normal sphincter tone, no lesions  Endometrial biopsy recommended.  Discussed with patient.  Verbal and written consent obtained.   Procedure:  Speculum placed.  Cervix visualized and cleansed with betadine prep.  A single toothed tenaculum was applied to the anterior lip of the cervix.  Endometrial pipelle was advanced through the cervix into the endometrial cavity without difficulty.  Pipelle passed to 8cm.  Suction applied and pipelle removed with good tissue sample obtained.  Tenculum removed.  No bleeding noted.  Patient tolerated procedure well.  Chaperone, Lora    Clark, RN, was present for exam.  Assessment/Plan: 1. Irregular bleeding - Surgical pathology( [facility]/ [facility]) - pathology results and recommendations will be caled to pt  2. Thickened endometrium  3. Family history of breast cancer - will try to get copy of genetic testing from [facility].  Release signed today.  4. Cervical cancer screening - Cytology - PAP( [place])  Total time with pt:  42 minutes 

## 2021-01-18 LAB — CYTOLOGY - PAP
Comment: NEGATIVE
Diagnosis: NEGATIVE
High risk HPV: NEGATIVE

## 2021-01-18 LAB — SURGICAL PATHOLOGY

## 2021-01-19 ENCOUNTER — Other Ambulatory Visit (INDEPENDENT_AMBULATORY_CARE_PROVIDER_SITE_OTHER): Payer: Self-pay | Admitting: Family Medicine

## 2021-01-19 DIAGNOSIS — K760 Fatty (change of) liver, not elsewhere classified: Secondary | ICD-10-CM

## 2021-01-19 DIAGNOSIS — R7303 Prediabetes: Secondary | ICD-10-CM

## 2021-01-20 ENCOUNTER — Other Ambulatory Visit: Payer: Self-pay | Admitting: Physician Assistant

## 2021-01-20 ENCOUNTER — Other Ambulatory Visit: Payer: Self-pay

## 2021-01-20 ENCOUNTER — Ambulatory Visit
Admission: RE | Admit: 2021-01-20 | Discharge: 2021-01-20 | Disposition: A | Discharge status: Home / Self Care | Payer: No Typology Code available for payment source | Source: Ambulatory Visit | Attending: Physician Assistant | Admitting: Physician Assistant

## 2021-01-20 DIAGNOSIS — R928 Other abnormal and inconclusive findings on diagnostic imaging of breast: Secondary | ICD-10-CM

## 2021-01-20 DIAGNOSIS — R921 Mammographic calcification found on diagnostic imaging of breast: Secondary | ICD-10-CM

## 2021-01-22 ENCOUNTER — Encounter (HOSPITAL_BASED_OUTPATIENT_CLINIC_OR_DEPARTMENT_OTHER): Payer: Self-pay | Admitting: Obstetrics & Gynecology

## 2021-01-22 ENCOUNTER — Other Ambulatory Visit: Payer: Self-pay

## 2021-01-22 ENCOUNTER — Ambulatory Visit (INDEPENDENT_AMBULATORY_CARE_PROVIDER_SITE_OTHER): Payer: No Typology Code available for payment source | Admitting: Family Medicine

## 2021-01-22 VITALS — BP 105/70 | HR 74 | Temp 98.0°F | Ht 65.0 in | Wt 201.0 lb

## 2021-01-22 DIAGNOSIS — Z6834 Body mass index (BMI) 34.0-34.9, adult: Secondary | ICD-10-CM | POA: Diagnosis not present

## 2021-01-22 DIAGNOSIS — E6609 Other obesity due to excess calories: Secondary | ICD-10-CM

## 2021-01-22 DIAGNOSIS — Z9189 Other specified personal risk factors, not elsewhere classified: Secondary | ICD-10-CM

## 2021-01-22 DIAGNOSIS — R7303 Prediabetes: Secondary | ICD-10-CM | POA: Diagnosis not present

## 2021-01-22 MED ORDER — OZEMPIC (2 MG/DOSE) 8 MG/3ML ~~LOC~~ SOPN: 2.0000 mg | PEN_INJECTOR | SUBCUTANEOUS | 0 refills | Status: DC

## 2021-01-22 NOTE — Telephone Encounter (Signed)
DR Raliegh Scarlet

## 2021-01-25 ENCOUNTER — Telehealth: Payer: Self-pay | Admitting: *Deleted

## 2021-01-25 NOTE — Telephone Encounter (Signed)
Call to patient to review surgery dates.  Patient will be traveling until 02-14-21. Agreeable to 02-21-21.  Will schedule and call back with estimated time and instructions.

## 2021-01-29 ENCOUNTER — Ambulatory Visit
Admission: RE | Admit: 2021-01-29 | Discharge: 2021-01-29 | Disposition: A | Discharge status: Home / Self Care | Payer: No Typology Code available for payment source | Source: Ambulatory Visit | Attending: Physician Assistant | Admitting: Physician Assistant

## 2021-01-29 ENCOUNTER — Other Ambulatory Visit: Payer: Self-pay

## 2021-01-29 ENCOUNTER — Encounter (HOSPITAL_BASED_OUTPATIENT_CLINIC_OR_DEPARTMENT_OTHER): Payer: Self-pay | Admitting: *Deleted

## 2021-01-29 DIAGNOSIS — R921 Mammographic calcification found on diagnostic imaging of breast: Secondary | ICD-10-CM

## 2021-01-29 NOTE — Telephone Encounter (Signed)
Call to patient.  Advised surgery confirmed for 02-21-21 at 1000 at [facility]. Arrive 8am. Advised will receive letter in mail and pre0op instruction call.  Encounter closed.

## 2021-01-30 NOTE — Progress Notes (Signed)
[facility] breast pathology did not show any malignancy.

## 2021-01-31 NOTE — Progress Notes (Signed)
Chief Complaint:   OBESITY [name] is here to discuss her progress with her obesity treatment plan along with follow-up of her obesity related diagnoses.   Today's visit was #: 14 Starting weight: 208 lbs Starting date: 04/05/2020 Today's weight: 201 lbs Today's date: 01/22/2021 Weight change since last visit: 0 Total lbs lost to date: 7 lbs Body mass index is 33.45 kg/m.  Total weight loss percentage to date: -3.37%  Interim History:   - [name] says she is frustrated because she is not losing more weight.  She has eaten much healthier and she is wondering why she has not lost more.  For breakfast, [name] with water (190 calories).  For lunch, she has 3 ounces of [place] breast with may, [name], fruit, and pickles.  For dinner, olive oil, mozzarella, 2 chicken sausage links, tomatoes, onions, and italian bread.  She is going to [facility] for 9 days within the next 2-3 weeks.  Current Meal Plan: the Category 2 Plan for 70-80% of the time.   Current Exercise Plan: Walking for 20 minutes 3 times per week.  Current Anti-Obesity Medications: Ozempic 2 mg subcutaneously weekly. Side effects: None.   Assessment/Plan:   Medications Discontinued During This Encounter  Medication Reason   Semaglutide, 2 MG/DOSE, (OZEMPIC, 2 MG/DOSE,) 8 MG/3ML SOPN Reorder   Meds ordered this encounter  Medications   Semaglutide, 2 MG/DOSE, (OZEMPIC, 2 MG/DOSE,) 8 MG/3ML SOPN    Sig: Inject 2 mg into the skin once a week.    Dispense:  3 mL    Refill:  0   1. Prediabetes At goal. Goal is HgbA1c < 5.7.  Medication: Ozempic 2 mg subcutaneously weekly.    Plan:  She will continue to focus on protein-rich, low simple carbohydrate foods. We reviewed the importance of hydration, regular exercise for stress reduction, and restorative sleep.   Lab Results  Component Value Date   HGBA1C 5.4 07/24/2020   Lab Results  Component Value Date   INSULIN 12.5  07/24/2020   INSULIN 25.0 (H) 04/05/2020   - Refill Semaglutide, 2 MG/DOSE, (OZEMPIC, 2 MG/DOSE,) 8 MG/3ML SOPN; Inject 2 mg into the skin once a week.  Dispense: 3 mL; Refill: 0   2. At risk for side effect of medication Due to Kailea's current conditions and medications, she is at a higher risk for drug side effect.  At least 9 minutes was spent on counseling her about these concerns today.  We discussed the benefits and potential risks of these medications, and all of patient's concerns were addressed and questions were answered.  she will call us, or their PCP or other specialists who treat their conditions with medications, with any questions or concerns that may develop.      3. Current BMI 33  Course: [name] is currently in the action stage of change. As such, her goal is to continue with weight loss efforts.   Nutrition goals: She has agreed to the Category 2 Plan, but she will also journal everything she eats.   Exercise goals:  As is.  Behavioral modification strategies: meal planning and cooking strategies and keeping a strict food journal.  Lilliemae has agreed to follow-up with our clinic in 3 weeks. She was informed of the importance of frequent follow-up visits to maximize her success with intensive lifestyle modifications for her multiple health conditions.    Objective:   Blood pressure 105/70, pulse 74, temperature 98 F (36.7 C), height  5\' 5"  (1.651 m), weight 201 lb (91.2 kg), SpO2 97 %. Body mass index is 33.45 kg/m.  General: Cooperative, alert, well developed, in no acute distress. HEENT: Conjunctivae and lids unremarkable. Cardiovascular: Regular rhythm.  Lungs: Normal work of breathing. Neurologic: No focal deficits.   Lab Results  Component Value Date   CREATININE 0.51 12/26/2020   BUN 16 12/26/2020   NA 139 12/26/2020   K 4.3 12/26/2020   CL 105 12/26/2020   CO2 25 12/26/2020   Lab Results  Component Value Date   ALT 14 12/26/2020   AST 13  12/26/2020   ALKPHOS 75 07/24/2020   BILITOT 0.4 12/26/2020   Lab Results  Component Value Date   HGBA1C 5.4 07/24/2020   HGBA1C 5.2 03/31/2020   HGBA1C 5.4 10/15/2019   HGBA1C 5.4 10/23/2018   HGBA1C 5.4 09/12/2017   Lab Results  Component Value Date   INSULIN 12.5 07/24/2020   INSULIN 25.0 (H) 04/05/2020   Lab Results  Component Value Date   TSH 2.82 12/26/2020   Lab Results  Component Value Date   CHOL 242 (H) 07/24/2020   HDL 57 07/24/2020   LDLCALC 136 (H) 07/24/2020   LDLDIRECT 194.4 09/10/2010   TRIG 274 (H) 07/24/2020   CHOLHDL 4.2 07/24/2020   Lab Results  Component Value Date   WBC 10.6 12/26/2020   HGB 14.0 12/26/2020   HCT 41.5 12/26/2020   MCV 92.6 12/26/2020   PLT 374 12/26/2020   Lab Results  Component Value Date   IRON 91 05/01/2015   TIBC 376 05/01/2015   FERRITIN 60 06/27/2015    Attestation Statements:   Reviewed by clinician on day of visit: allergies, medications, problem list, medical history, surgical history, family history, social history, and previous encounter notes.  I, Water quality scientist, CMA, am acting as Location manager for [employer], DO.  I have reviewed the above documentation for accuracy and completeness, and I agree with the above. Marjory Sneddon, D.O.  The [place] was signed into law in 2016 which includes the topic of electronic health records.  This provides immediate access to information in MyChart.  This includes consultation notes, operative notes, office notes, lab results and pathology reports.  If you have any questions about what you read please let us know at your next visit so we can discuss your concerns and take corrective action if need be.  We are right here with you.

## 2021-02-05 ENCOUNTER — Other Ambulatory Visit: Payer: Self-pay | Admitting: Physician Assistant

## 2021-02-05 ENCOUNTER — Other Ambulatory Visit (HOSPITAL_BASED_OUTPATIENT_CLINIC_OR_DEPARTMENT_OTHER): Payer: Self-pay | Admitting: Obstetrics & Gynecology

## 2021-02-05 DIAGNOSIS — I493 Ventricular premature depolarization: Secondary | ICD-10-CM

## 2021-02-06 ENCOUNTER — Encounter (HOSPITAL_BASED_OUTPATIENT_CLINIC_OR_DEPARTMENT_OTHER): Payer: Self-pay | Admitting: *Deleted

## 2021-02-15 ENCOUNTER — Encounter (HOSPITAL_BASED_OUTPATIENT_CLINIC_OR_DEPARTMENT_OTHER): Payer: Self-pay | Admitting: Obstetrics & Gynecology

## 2021-02-15 ENCOUNTER — Ambulatory Visit (INDEPENDENT_AMBULATORY_CARE_PROVIDER_SITE_OTHER): Payer: No Typology Code available for payment source | Admitting: Obstetrics & Gynecology

## 2021-02-15 ENCOUNTER — Other Ambulatory Visit: Payer: Self-pay

## 2021-02-15 VITALS — BP 114/83 | HR 81 | Wt 208.0 lb

## 2021-02-15 DIAGNOSIS — Z01818 Encounter for other preprocedural examination: Secondary | ICD-10-CM

## 2021-02-15 DIAGNOSIS — Z79899 Other long term (current) drug therapy: Secondary | ICD-10-CM

## 2021-02-15 DIAGNOSIS — N926 Irregular menstruation, unspecified: Secondary | ICD-10-CM

## 2021-02-15 DIAGNOSIS — R9389 Abnormal findings on diagnostic imaging of other specified body structures: Secondary | ICD-10-CM

## 2021-02-15 DIAGNOSIS — Z803 Family history of malignant neoplasm of breast: Secondary | ICD-10-CM

## 2021-02-15 NOTE — Progress Notes (Signed)
50 y.o. [id number] Married White or Caucasian female here for discussion of upcoming procedure.  Hysteroscopy with possible polyp resection, D&C planned due to irregular bleeding and 56mm endometrial thickness on ultrasound.  Pre-op evaluation thus far has included endometrial biopsy obtained 01/17/2021 showing benign endometrium with tubal metaplasia.     Procedure discussed with patient.  Recovery and pain management discussed.  Risks discussed including but not limited to bleeding, rare risk of transfusion, infection, 1% risk of uterine perforation with risks of fluid deficit causing cardiac arrythmia, cerebral swelling and/or need to stop procedure early.  Fluid emboli and rare risk of death discussed.  DVT/PE, rare risk of risk of bowel/bladder/ureteral/vascular injury.  Patient aware if pathology abnormal she may need additional treatment.  All questions answered.    Ob Hx:   Patient's last menstrual period was 02/06/2021.          Sexually active: Yes.   Birth control: vasectomy Last pap: 01/17/2021 Last MMG: 12/28/2020 had biopsy afterwards that was negative Smoking: No   Past Surgical History:  Procedure Laterality Date   CHOLECYSTECTOMY      Past Medical History:  Diagnosis Date   Anemia    Anxiety    Arthritis    Left Foot   Back pain    Chronic fatigue syndrome    Gallbladder problem    High cholesterol    Hyperlipidemia    Hypertension    Hypothyroidism    IBS (irritable bowel syndrome)    Insulin resistance    Joint pain    Low vitamin D level    Obesity (BMI 30-39.9)    Palpitations    PCOS (polycystic ovarian syndrome)    PVC's (premature ventricular contractions)    Vitamin D deficiency     Allergies: Patient has no known allergies.  Current Outpatient Medications  Medication Sig Dispense Refill   ALPRAZolam (XANAX) 0.5 MG tablet TAKE 1 TABLET BY MOUTH TWICE A DAY AS NEEDED FOR ANXIETY 60 tablet 1   Cholecalciferol (VITAMIN D) 125 MCG (5000 UT) CAPS Take 5,000  Units by mouth daily.      metFORMIN (GLUCOPHAGE) 500 MG tablet Take 2 tablets (1,000 mg total) by mouth daily with breakfast. 60 tablet 0   nebivolol (BYSTOLIC) 5 MG tablet TAKE 1 TABLET DAILY 90 tablet 3   Semaglutide, 2 MG/DOSE, (OZEMPIC, 2 MG/DOSE,) 8 MG/3ML SOPN Inject 2 mg into the skin once a week. 3 mL 0   sertraline (ZOLOFT) 50 MG tablet Take 1 tablet (50 mg total) by mouth daily. 90 tablet 3   tretinoin (RETIN-A) 0.1 % cream Apply topically at bedtime. 45 g 1   No current facility-administered medications for this visit.    ROS: Pertinent items noted in HPI and remainder of comprehensive ROS otherwise negative.  Exam:   BP 114/83   Pulse 81   Wt 208 lb (94.3 kg)   LMP 02/06/2021   BMI 34.61 kg/m   General appearance: alert and cooperative Head: Normocephalic, without obvious abnormality, atraumatic Lungs: clear to auscultation bilaterally Heart: regular rate and rhythm, S1, S2 normal, no murmur, click, rub or gallop Neurologic: Grossly normal   Assessment/Plan: 1. Thickened endometrium - hysteroscopy with possible polyp resection, D&C planned.  Pre and post op planning discussed.  Procedure reviewed.  Questions answered.  Pt ready to proceed  2. High risk medication use - pre op nursing recommended BMP be obtained today - Basic metabolic panel  3. Irregular bleeding  4. Family history of breast cancer -  planning breast MRI in 6 months - declines referral to high risk breast clinic at this time

## 2021-02-16 LAB — BASIC METABOLIC PANEL
BUN/Creatinine Ratio: 16 (ref 9–23)
BUN: 8 mg/dL (ref 6–24)
CO2: 24 mmol/L (ref 20–29)
Calcium: 9.4 mg/dL (ref 8.7–10.2)
Chloride: 106 mmol/L (ref 96–106)
Creatinine, Ser: 0.49 mg/dL — ABNORMAL LOW (ref 0.57–1.00)
Glucose: 88 mg/dL (ref 65–99)
Potassium: 4.9 mmol/L (ref 3.5–5.2)
Sodium: 142 mmol/L (ref 134–144)
eGFR: 114 mL/min/{1.73_m2} (ref >59)

## 2021-02-17 ENCOUNTER — Other Ambulatory Visit (INDEPENDENT_AMBULATORY_CARE_PROVIDER_SITE_OTHER): Payer: Self-pay | Admitting: Family Medicine

## 2021-02-17 DIAGNOSIS — R7303 Prediabetes: Secondary | ICD-10-CM

## 2021-02-17 DIAGNOSIS — K760 Fatty (change of) liver, not elsewhere classified: Secondary | ICD-10-CM

## 2021-02-19 NOTE — Telephone Encounter (Signed)
Last OV with Dr Opalski 

## 2021-02-19 NOTE — Progress Notes (Signed)
Left message Reminding pt to come in for labwork.

## 2021-02-20 ENCOUNTER — Other Ambulatory Visit: Payer: Self-pay

## 2021-02-20 ENCOUNTER — Ambulatory Visit (INDEPENDENT_AMBULATORY_CARE_PROVIDER_SITE_OTHER): Payer: No Typology Code available for payment source | Admitting: Family Medicine

## 2021-02-20 ENCOUNTER — Other Ambulatory Visit: Payer: No Typology Code available for payment source

## 2021-02-20 DIAGNOSIS — Z01812 Encounter for preprocedural laboratory examination: Secondary | ICD-10-CM

## 2021-02-20 LAB — CBC
HCT: 43.3 % (ref 35.0–45.0)
Hemoglobin: 15 g/dL (ref 11.7–15.5)
MCH: 31.9 pg (ref 27.0–33.0)
MCHC: 34.6 g/dL (ref 32.0–36.0)
MCV: 92.1 fL (ref 80.0–100.0)
MPV: 10.4 fL (ref 7.5–12.5)
Platelets: 345 10*3/uL (ref 140–400)
RBC: 4.7 10*6/uL (ref 3.80–5.10)
RDW: 12.9 % (ref 11.0–15.0)
WBC: 8.6 10*3/uL (ref 3.8–10.8)

## 2021-02-20 LAB — HCG, QUANTITATIVE, PREGNANCY: HCG, Total, QN: <3 m[IU]/mL

## 2021-02-20 NOTE — Progress Notes (Signed)
Labs drawn for Stat CBC and HCG due to having outpatient surgery tomorrow per Dr Sabra Heck.

## 2021-02-21 ENCOUNTER — Ambulatory Visit (HOSPITAL_BASED_OUTPATIENT_CLINIC_OR_DEPARTMENT_OTHER): Payer: No Typology Code available for payment source | Admitting: Certified Registered"

## 2021-02-21 ENCOUNTER — Ambulatory Visit (HOSPITAL_BASED_OUTPATIENT_CLINIC_OR_DEPARTMENT_OTHER)
Admission: RE | Admit: 2021-02-21 | Discharge: 2021-02-21 | Disposition: A | Discharge status: Home / Self Care | Payer: No Typology Code available for payment source | Attending: Obstetrics & Gynecology | Admitting: Obstetrics & Gynecology

## 2021-02-21 ENCOUNTER — Encounter (HOSPITAL_BASED_OUTPATIENT_CLINIC_OR_DEPARTMENT_OTHER): Payer: Self-pay | Admitting: Obstetrics & Gynecology

## 2021-02-21 ENCOUNTER — Other Ambulatory Visit (HOSPITAL_BASED_OUTPATIENT_CLINIC_OR_DEPARTMENT_OTHER): Payer: Self-pay | Admitting: Obstetrics & Gynecology

## 2021-02-21 ENCOUNTER — Other Ambulatory Visit: Payer: Self-pay

## 2021-02-21 ENCOUNTER — Encounter (HOSPITAL_BASED_OUTPATIENT_CLINIC_OR_DEPARTMENT_OTHER)
Admission: RE | Disposition: A | Discharge status: Home / Self Care | Payer: Self-pay | Source: Home / Self Care | Attending: Obstetrics & Gynecology

## 2021-02-21 DIAGNOSIS — Z7984 Long term (current) use of oral hypoglycemic drugs: Secondary | ICD-10-CM | POA: Diagnosis not present

## 2021-02-21 DIAGNOSIS — N840 Polyp of corpus uteri: Secondary | ICD-10-CM

## 2021-02-21 DIAGNOSIS — Z87891 Personal history of nicotine dependence: Secondary | ICD-10-CM | POA: Insufficient documentation

## 2021-02-21 DIAGNOSIS — N926 Irregular menstruation, unspecified: Secondary | ICD-10-CM

## 2021-02-21 DIAGNOSIS — R9389 Abnormal findings on diagnostic imaging of other specified body structures: Secondary | ICD-10-CM

## 2021-02-21 DIAGNOSIS — Z79899 Other long term (current) drug therapy: Secondary | ICD-10-CM | POA: Insufficient documentation

## 2021-02-21 HISTORY — PX: DILATATION & CURETTAGE/HYSTEROSCOPY WITH MYOSURE: SHX6511

## 2021-02-21 LAB — GLUCOSE, CAPILLARY
Glucose-Capillary: 100 mg/dL — ABNORMAL HIGH (ref 70–99)
Glucose-Capillary: 88 mg/dL (ref 70–99)

## 2021-02-21 LAB — POCT PREGNANCY, URINE: Preg Test, Ur: NEGATIVE

## 2021-02-21 SURGERY — DILATATION & CURETTAGE/HYSTEROSCOPY WITH MYOSURE
Anesthesia: General | Site: Vagina

## 2021-02-21 MED ORDER — DEXMEDETOMIDINE (PRECEDEX) IN NS 20 MCG/5ML (4 MCG/ML) IV SYRINGE
PREFILLED_SYRINGE | INTRAVENOUS | Status: DC | PRN
  Administered 2021-02-21: 8 ug via INTRAVENOUS

## 2021-02-21 MED ORDER — POVIDONE-IODINE 10 % EX SWAB: 2.0000 "application " | Freq: Once | CUTANEOUS | Status: DC

## 2021-02-21 MED ORDER — HYDROCODONE-ACETAMINOPHEN 5-325 MG PO TABS: 1.0000 | ORAL_TABLET | Freq: Four times a day (QID) | ORAL | 0 refills | Status: AC | PRN

## 2021-02-21 MED ORDER — ONDANSETRON HCL 4 MG/2ML IJ SOLN
INTRAMUSCULAR | Status: DC | PRN
  Administered 2021-02-21: 4 mg via INTRAVENOUS

## 2021-02-21 MED ORDER — AMISULPRIDE (ANTIEMETIC) 5 MG/2ML IV SOLN: 10.0000 mg | Freq: Once | INTRAVENOUS | Status: DC | PRN

## 2021-02-21 MED ORDER — MIDAZOLAM HCL 2 MG/2ML IJ SOLN
INTRAMUSCULAR | Status: AC
  Filled 2021-02-21: qty 2

## 2021-02-21 MED ORDER — LIDOCAINE HCL (CARDIAC) PF 100 MG/5ML IV SOSY
PREFILLED_SYRINGE | INTRAVENOUS | Status: DC | PRN
  Administered 2021-02-21: 60 mg via INTRAVENOUS

## 2021-02-21 MED ORDER — HYDROMORPHONE HCL 1 MG/ML IJ SOLN
0.2500 mg | INTRAMUSCULAR | Status: DC | PRN
  Administered 2021-02-21: 0.25 mg via INTRAVENOUS

## 2021-02-21 MED ORDER — LIDOCAINE-EPINEPHRINE 1 %-1:100000 IJ SOLN
INTRAMUSCULAR | Status: DC | PRN
  Administered 2021-02-21: 10 mL

## 2021-02-21 MED ORDER — KETOROLAC TROMETHAMINE 30 MG/ML IJ SOLN
INTRAMUSCULAR | Status: DC | PRN
  Administered 2021-02-21: 30 mg via INTRAVENOUS

## 2021-02-21 MED ORDER — PROPOFOL 10 MG/ML IV BOLUS
INTRAVENOUS | Status: DC | PRN
  Administered 2021-02-21: 200 mg via INTRAVENOUS

## 2021-02-21 MED ORDER — SODIUM CHLORIDE 0.9 % IR SOLN
Status: DC | PRN
  Administered 2021-02-21: 250 mL

## 2021-02-21 MED ORDER — OXYCODONE HCL 5 MG PO TABS: 5.0000 mg | ORAL_TABLET | Freq: Once | ORAL | Status: DC | PRN

## 2021-02-21 MED ORDER — PROMETHAZINE HCL 25 MG/ML IJ SOLN: 6.2500 mg | INTRAMUSCULAR | Status: DC | PRN

## 2021-02-21 MED ORDER — LIDOCAINE-EPINEPHRINE 1 %-1:100000 IJ SOLN
INTRAMUSCULAR | Status: AC
  Filled 2021-02-21: qty 1

## 2021-02-21 MED ORDER — MEPERIDINE HCL 25 MG/ML IJ SOLN: 6.2500 mg | INTRAMUSCULAR | Status: DC | PRN

## 2021-02-21 MED ORDER — IBUPROFEN 800 MG PO TABS: 800.0000 mg | ORAL_TABLET | Freq: Three times a day (TID) | ORAL | 0 refills | Status: DC | PRN

## 2021-02-21 MED ORDER — LACTATED RINGERS IV SOLN: INTRAVENOUS | Status: DC

## 2021-02-21 MED ORDER — MIDAZOLAM HCL 5 MG/5ML IJ SOLN
INTRAMUSCULAR | Status: DC | PRN
  Administered 2021-02-21: 2 mg via INTRAVENOUS

## 2021-02-21 MED ORDER — DEXAMETHASONE SODIUM PHOSPHATE 4 MG/ML IJ SOLN
INTRAMUSCULAR | Status: DC | PRN
  Administered 2021-02-21: 4 mg via INTRAVENOUS

## 2021-02-21 MED ORDER — OXYCODONE HCL 5 MG/5ML PO SOLN: 5.0000 mg | Freq: Once | ORAL | Status: DC | PRN

## 2021-02-21 MED ORDER — HYDROMORPHONE HCL 1 MG/ML IJ SOLN
INTRAMUSCULAR | Status: AC
  Filled 2021-02-21: qty 0.5

## 2021-02-21 MED ORDER — SILVER NITRATE-POT NITRATE 75-25 % EX MISC
CUTANEOUS | Status: AC
  Filled 2021-02-21: qty 10

## 2021-02-21 MED ORDER — FENTANYL CITRATE (PF) 100 MCG/2ML IJ SOLN
INTRAMUSCULAR | Status: AC
  Filled 2021-02-21: qty 2

## 2021-02-21 MED ORDER — FENTANYL CITRATE (PF) 100 MCG/2ML IJ SOLN
INTRAMUSCULAR | Status: DC | PRN
  Administered 2021-02-21: 100 ug via INTRAVENOUS

## 2021-02-21 SURGICAL SUPPLY — 17 items
CANISTER SUCT 1200ML W/VALVE (MISCELLANEOUS) ×2 IMPLANT
CATH ROBINSON RED A/P 16FR (CATHETERS) ×2 IMPLANT
DEVICE MYOSURE LITE (MISCELLANEOUS) IMPLANT
DEVICE MYOSURE REACH (MISCELLANEOUS) ×1 IMPLANT
DILATOR CANAL MILEX (MISCELLANEOUS) ×1 IMPLANT
GAUZE 4X4 16PLY ~~LOC~~+RFID DBL (SPONGE) ×2 IMPLANT
GLOVE SURG LTX SZ6.5 (GLOVE) ×4 IMPLANT
GLOVE SURG UNDER POLY LF SZ7 (GLOVE) ×5 IMPLANT
GOWN STRL REUS W/ TWL LRG LVL3 (GOWN DISPOSABLE) ×2 IMPLANT
GOWN STRL REUS W/TWL LRG LVL3 (GOWN DISPOSABLE) ×6
KIT PROCEDURE FLUENT (KITS) ×2 IMPLANT
KIT TURNOVER KIT B (KITS) ×2 IMPLANT
PACK VAGINAL MINOR WOMEN LF (CUSTOM PROCEDURE TRAY) ×2 IMPLANT
PAD OB MATERNITY 4.3X12.25 (PERSONAL CARE ITEMS) ×2 IMPLANT
PAD PREP 24X48 CUFFED NSTRL (MISCELLANEOUS) ×2 IMPLANT
SEAL ROD LENS SCOPE MYOSURE (ABLATOR) ×2 IMPLANT
TOWEL GREEN STERILE FF (TOWEL DISPOSABLE) ×4 IMPLANT

## 2021-02-21 NOTE — Anesthesia Preprocedure Evaluation (Signed)
Anesthesia Evaluation  Patient identified by MRN, date of birth, ID band Patient awake    Reviewed: Allergy & Precautions, NPO status , Patient's Chart, lab work & pertinent test results  Airway Mallampati: II  TM Distance: >3 FB Neck ROM: Full    Dental no notable dental hx.    Pulmonary sleep apnea , former smoker,    Pulmonary exam normal breath sounds clear to auscultation       Cardiovascular hypertension, Pt. on medications negative cardio ROS Normal cardiovascular exam Rhythm:Regular Rate:Normal     Neuro/Psych Anxiety negative neurological ROS  negative psych ROS   GI/Hepatic negative GI ROS, Neg liver ROS,   Endo/Other  diabetesHypothyroidism   Renal/GU negative Renal ROS  negative genitourinary   Musculoskeletal  (+) Arthritis , Osteoarthritis,    Abdominal (+) + obese,   Peds negative pediatric ROS (+)  Hematology negative hematology ROS (+)   Anesthesia Other Findings   Reproductive/Obstetrics negative OB ROS                             Anesthesia Physical Anesthesia Plan  ASA: 3  Anesthesia Plan: General   Post-op Pain Management:    Induction: Intravenous  PONV Risk Score and Plan: 3 and Ondansetron, Dexamethasone, Midazolam and Treatment may vary due to age or medical condition  Airway Management Planned: LMA  Additional Equipment:   Intra-op Plan:   Post-operative Plan: Extubation in OR  Informed Consent: I have reviewed the patients History and Physical, chart, labs and discussed the procedure including the risks, benefits and alternatives for the proposed anesthesia with the patient or authorized representative who has indicated his/her understanding and acceptance.     Dental advisory given  Plan Discussed with: CRNA  Anesthesia Plan Comments:         Anesthesia Quick Evaluation

## 2021-02-21 NOTE — Progress Notes (Signed)
Opened in error

## 2021-02-21 NOTE — Discharge Instructions (Signed)
May take NSAIDS (Ibuprofen, Motrin) after 4:30pm, if needed.    Post Anesthesia Home Care Instructions  Activity: Get plenty of rest for the remainder of the day. A responsible individual must stay with you for 24 hours following the procedure.  For the next 24 hours, DO NOT: -Drive a car -Paediatric nurse -Drink alcoholic beverages -Take any medication unless instructed by your physician -Make any legal decisions or sign important papers.  Meals: Start with liquid foods such as gelatin or soup. Progress to regular foods as tolerated. Avoid greasy, spicy, heavy foods. If nausea and/or vomiting occur, drink only clear liquids until the nausea and/or vomiting subsides. Call your physician if vomiting continues.  Special Instructions/Symptoms: Your throat may feel dry or sore from the anesthesia or the breathing tube placed in your throat during surgery. If this causes discomfort, gargle with warm salt water. The discomfort should disappear within 24 hours.  If you had a scopolamine patch placed behind your ear for the management of post- operative nausea and/or vomiting:  1. The medication in the patch is effective for 72 hours, after which it should be removed.  Wrap patch in a tissue and discard in the trash. Wash hands thoroughly with soap and water. 2. You may remove the patch earlier than 72 hours if you experience unpleasant side effects which may include dry mouth, dizziness or visual disturbances. 3. Avoid touching the patch. Wash your hands with soap and water after contact with the patch.

## 2021-02-21 NOTE — Anesthesia Postprocedure Evaluation (Signed)
Anesthesia Post Note  Patient: MONESHA MONREAL  Procedure(s) Performed: DILATATION & CURETTAGE/HYSTEROSCOPY WITH MYOSURE (Vagina )     Patient location during evaluation: PACU Anesthesia Type: General Level of consciousness: awake and alert Pain management: pain level controlled Vital Signs Assessment: post-procedure vital signs reviewed and stable Respiratory status: spontaneous breathing, nonlabored ventilation and respiratory function stable Cardiovascular status: blood pressure returned to baseline and stable Postop Assessment: no apparent nausea or vomiting Anesthetic complications: no   No notable events documented.  Last Vitals:  Vitals:   02/21/21 1100 02/21/21 1148  BP: 113/80 (!) 121/91  Pulse: 83 78  Resp: 14 15  Temp:  (!) 36.1 C  SpO2: 97% 99%    Last Pain:  Vitals:   02/21/21 1120  TempSrc:   PainSc: 0-No pain                 Lynda Rainwater

## 2021-02-21 NOTE — H&P (Signed)
[name] is an 50 y.o. female G20P4 MWF here for hysteroscopy with possible polyp resection, D&C planned due to irregular bleeding and 31mm endometrial thickness on ultrasound.  Pre-op evaluation thus far has included endometrial biopsy obtained 01/17/2021 showing benign endometrium with tubal metaplasia.  Risks, benefits and alternatives have been discussed with pt.  She is here and ready to proceed.  Pertinent Gynecological History: Menses:  irregular bleeding Contraception: vasectomy DES exposure: denies Blood transfusions: none Sexually transmitted diseases: no past history Previous GYN Procedures:  none   Last mammogram: abnormal: 01/20/2021 with biopsy that was benign   Last pap: normal Date: 01/17/2021 OB History: G5, P4   Menstrual History: Patient's last menstrual period was 02/06/2021.    Past Medical History:  Diagnosis Date   Anemia    Anxiety    Arthritis    Left Foot   Back pain    Chronic fatigue syndrome    Gallbladder problem    High cholesterol    Hyperlipidemia    Hypertension    Hypothyroidism    IBS (irritable bowel syndrome)    Insulin resistance    Joint pain    Low vitamin D level    Obesity (BMI 30-39.9)    Palpitations    PCOS (polycystic ovarian syndrome)    PVC's (premature ventricular contractions)    Vitamin D deficiency     Past Surgical History:  Procedure Laterality Date   CHOLECYSTECTOMY      Family History  Problem Relation Age of Onset   Hyperlipidemia Mother    Hypertension Mother    Cancer Mother        breast cancer invasive/pancreatic cancer   Pancreatic cancer Mother    Pulmonary fibrosis Mother    Anxiety disorder Mother    Obesity Mother    Cancer Father    Alcohol abuse Father    Alcoholism Father    Cancer Sister        melanoma   Alcohol abuse Other        family hx   Arthritis Other        family hx   Hypertension Other        family hx   Stroke Other        <50 family hx   Colitis Other      Social History:  reports that she has quit smoking. She has never used smokeless tobacco. She reports current alcohol use. She reports that she does not use drugs.  Allergies: No Known Allergies  Medications Prior to Admission  Medication Sig Dispense Refill Last Dose   ALPRAZolam (XANAX) 0.5 MG tablet TAKE 1 TABLET BY MOUTH TWICE A DAY AS NEEDED FOR ANXIETY 60 tablet 1 Past Week   Cholecalciferol (VITAMIN D) 125 MCG (5000 UT) CAPS Take 5,000 Units by mouth daily.    02/21/2021 at 0730   metFORMIN (GLUCOPHAGE) 500 MG tablet Take 2 tablets (1,000 mg total) by mouth daily with breakfast. 60 tablet 0 02/20/2021   nebivolol (BYSTOLIC) 5 MG tablet TAKE 1 TABLET DAILY 90 tablet 3 02/21/2021 at 0730   Semaglutide, 2 MG/DOSE, (OZEMPIC, 2 MG/DOSE,) 8 MG/3ML SOPN Inject 2 mg into the skin once a week. 3 mL 0 02/17/2021   sertraline (ZOLOFT) 50 MG tablet Take 1 tablet (50 mg total) by mouth daily. 90 tablet 3 02/21/2021 at 0730   tretinoin (RETIN-A) 0.1 % cream Apply topically at bedtime. 45 g 1     Review of Systems  All other  systems reviewed and are negative.  Blood pressure 125/85, pulse 80, temperature 97.6 F (36.4 C), temperature source Oral, resp. rate 16, height 5\' 5"  (1.651 m), weight 92.2 kg, last menstrual period 02/06/2021, SpO2 100 %. Physical Exam Constitutional:      Appearance: Normal appearance.  Cardiovascular:     Rate and Rhythm: Normal rate and regular rhythm.  Pulmonary:     Effort: Pulmonary effort is normal.     Breath sounds: Normal breath sounds.  Neurological:     General: No focal deficit present.     Mental Status: She is alert.  Psychiatric:        Mood and Affect: Mood normal.    Results for orders placed or performed during the hospital encounter of 02/21/21 (from the past 24 hour(s))  Pregnancy, urine POC     Status: None   Collection Time: 02/21/21  9:01 AM  Result Value Ref Range   Preg Test, Ur NEGATIVE NEGATIVE    No results  found.  Assessment/Plan: 50 yo G5P4 MWF with irregular bleeding and thickened endometrium on ultrasound, measuring 79mm, here for hysteroscopy with possible polyp resection, D&C.  Pt ready to proceed. Questions answered.  Megan Salon 02/21/2021, 9:28 AM

## 2021-02-21 NOTE — Op Note (Signed)
02/21/2021  10:36 AM  PATIENT:  [name]  50 y.o. female  PRE-OPERATIVE DIAGNOSIS:  Irregular bleeding, thickened endometrial lining  POST-OPERATIVE DIAGNOSIS:  Irregular bleeding, thickened endometrial lining  PROCEDURE:  Procedure(s): DILATATION & CURETTAGE/HYSTEROSCOPY WITH MYOSURE  SURGEON:  Megan Salon  ASSISTANTS: OR staff.    ANESTHESIA:   general  ESTIMATED BLOOD LOSS: 5cc  BLOOD ADMINISTERED:none   FLUIDS: 500cc LR  UOP: 50 cc drained with I&O cath at beginning of procedure  SPECIMEN:  endometrial curetting, possible polyp  DISPOSITION OF SPECIMEN:  PATHOLOGY  FINDINGS: polypoid appearing lesion at fundus, thickened endometrium  DESCRIPTION OF OPERATION: Patient was taken to the operating room.  She is placed in the supine position. SCDs were on her lower extremities and functioning properly. General anesthesia with an LMA was administered without difficulty. Dr. Candida Peeling, anesthesia, oversaw case.  Legs were then placed in the [place] in the low lithotomy position. The legs were lifted to the high lithotomy position and the Betadine prep was used on the inner thighs perineum and vagina x3. Patient was draped in a normal standard fashion. An in and out catheterization with a red rubber Foley catheter was performed. Approximately 50 cc of clear urine was noted. A bivalve speculum was placed the vagina. The anterior lip of the cervix was grasped with single-tooth tenaculum.  A paracervical block of 1% lidocaine mixed one-to-one with epinephrine (1:100,000 units).  10 cc was used total. The cervix is dilated up to #21 [facility] dilators. The endometrial cavity sounded to 8 cm.   A 2.9 millimeter diagnostic hysteroscope was obtained. Normal saline was used as a hysteroscopic fluid. The hysteroscope was advanced through the endocervical canal into the endometrial cavity. The tubal ostia were noted bilaterally. Additional findings included polypoid looking  tissue and possible polyp, endometrial tissue was thickened as well.  The hysteroscope was removed. A #1 toothed curette was used to curette the cavity until rough gritty texture was noted in all quadrants. With revisualization of the hysteroscope, there was still polypoid tissue at the fundus.  Using the Myosure reach device, the polyp looking tissue was resected as well as fluffy appearing endometrium.  This was performed until there was no longer any abnormal finding.  At this point no other procedure was needed and this procedure was ended. The hysteroscope was removed. The fluid deficit was 250 cc of NS but there was fluid that collected on the pad underneath the patient and this could not be quantified so actual deficit was less than 250cc. The tenaculum was removed from the anterior lip of the cervix. The speculum was removed from the vagina. The prep was cleansed of the patient's skin. The legs are positioned back in the supine position. Sponge, lap, needle, initially counts were correct x2. Patient was taken to recovery in stable condition.   COUNTS:  YES  PLAN OF CARE: Transfer to PACU

## 2021-02-21 NOTE — Anesthesia Procedure Notes (Signed)
Procedure Name: LMA Insertion Date/Time: 02/21/2021 10:01 AM Performed by: Signe Colt, CRNA Pre-anesthesia Checklist: Patient identified, Emergency Drugs available, Suction available and Patient being monitored Patient Re-evaluated:Patient Re-evaluated prior to induction Oxygen Delivery Method: Circle System Utilized Preoxygenation: Pre-oxygenation with 100% oxygen Induction Type: IV induction Ventilation: Mask ventilation without difficulty LMA: LMA inserted LMA Size: 4.0 Number of attempts: 1 Airway Equipment and Method: bite block Placement Confirmation: positive ETCO2 Tube secured with: Tape Dental Injury: Teeth and Oropharynx as per pre-operative assessment

## 2021-02-21 NOTE — Transfer of Care (Signed)
Immediate Anesthesia Transfer of Care Note  Patient: [name]  Procedure(s) Performed: DILATATION & CURETTAGE/HYSTEROSCOPY WITH MYOSURE (Vagina )  Patient Location: PACU  Anesthesia Type:General  Level of Consciousness: drowsy and patient cooperative  Airway & Oxygen Therapy: Patient Spontanous Breathing and Patient connected to face mask oxygen  Post-op Assessment: Report given to RN and Post -op Vital signs reviewed and stable  Post vital signs: Reviewed and stable  Last Vitals:  Vitals Value Taken Time  BP 96/66 02/21/21 1040  Temp    Pulse 77 02/21/21 1041  Resp 17 02/21/21 1041  SpO2 96 % 02/21/21 1041  Vitals shown include unvalidated device data.  Last Pain:  Vitals:   02/21/21 0920  TempSrc: Oral  PainSc: 0-No pain         Complications: No notable events documented.

## 2021-02-22 ENCOUNTER — Encounter (HOSPITAL_BASED_OUTPATIENT_CLINIC_OR_DEPARTMENT_OTHER): Payer: Self-pay | Admitting: Obstetrics & Gynecology

## 2021-02-22 LAB — SURGICAL PATHOLOGY

## 2021-03-05 ENCOUNTER — Other Ambulatory Visit: Payer: Self-pay

## 2021-03-05 ENCOUNTER — Telehealth (INDEPENDENT_AMBULATORY_CARE_PROVIDER_SITE_OTHER): Payer: No Typology Code available for payment source | Admitting: Family Medicine

## 2021-03-05 ENCOUNTER — Encounter (INDEPENDENT_AMBULATORY_CARE_PROVIDER_SITE_OTHER): Payer: Self-pay | Admitting: Family Medicine

## 2021-03-05 DIAGNOSIS — Z6834 Body mass index (BMI) 34.0-34.9, adult: Secondary | ICD-10-CM

## 2021-03-05 DIAGNOSIS — E559 Vitamin D deficiency, unspecified: Secondary | ICD-10-CM

## 2021-03-05 DIAGNOSIS — Z9189 Other specified personal risk factors, not elsewhere classified: Secondary | ICD-10-CM | POA: Diagnosis not present

## 2021-03-05 DIAGNOSIS — R7303 Prediabetes: Secondary | ICD-10-CM | POA: Diagnosis not present

## 2021-03-05 DIAGNOSIS — E7849 Other hyperlipidemia: Secondary | ICD-10-CM | POA: Diagnosis not present

## 2021-03-05 DIAGNOSIS — E669 Obesity, unspecified: Secondary | ICD-10-CM

## 2021-03-05 MED ORDER — OZEMPIC (2 MG/DOSE) 8 MG/3ML ~~LOC~~ SOPN: 2.0000 mg | PEN_INJECTOR | SUBCUTANEOUS | 0 refills | Status: DC

## 2021-03-11 NOTE — Progress Notes (Signed)
TeleHealth Visit:  Due to the COVID-19 pandemic, this visit was completed with telemedicine (audio/video) technology to reduce patient and provider exposure as well as to preserve personal protective equipment.   Babe has verbally consented to this TeleHealth visit. The patient is located at home, the provider is located at the [facility] office. The participants in this visit include the listed provider and patient. The visit was conducted today via video.  Chief Complaint: OBESITY [name] is here to discuss her progress with her obesity treatment plan along with follow-up of her obesity related diagnoses. [name] is on the Category 2 Plan and states she is following her eating plan approximately 50% of the time. [name] states she is walking 20 minutes 4 times per week.  Today's visit was #: 15 Starting weight: 208 lbs Starting date: 04/05/2020  Interim History: Today was a live video OV. [name] was in [place] for work and found out a Social worker tested positive for [place]. She is traveling a lot for work. She has catered lunch and pre-determined menu for dinner. She feels she has lost a couple of pounds since her last OV on 01/22/2021.  Subjective:   1. Pre-diabetes [name] is tolerating medication well. Meds help with hunger and food cravings. She denies side effects, constipation, or changes with bowels.  2. Other hyperlipidemia [name] is not on statin therapy. She has hyperlipidemia and has been trying to improve her cholesterol levels with intensive lifestyle modification including a low saturated fat diet, exercise and weight loss. She denies any chest pain, claudication or myalgias.  Lab Results  Component Value Date   ALT 14 12/26/2020   AST 13 12/26/2020   ALKPHOS 75 07/24/2020   BILITOT 0.4 12/26/2020   Lab Results  Component Value Date   CHOL 242 (H) 07/24/2020   HDL 57 07/24/2020   LDLCALC 136 (H) 07/24/2020   LDLDIRECT 194.4 09/10/2010    TRIG 274 (H) 07/24/2020   CHOLHDL 4.2 07/24/2020   3. Vitamin D deficiency She is currently taking OTC vitamin D 5,000 IU each day. She denies nausea, vomiting or muscle weakness.  Lab Results  Component Value Date   VD25OH 37.0 07/24/2020   VD25OH 47.5 04/05/2020   VD25OH 45 11/22/2019   4. At risk for impaired metabolic function [name] is at risk for impaired metabolic function.  Assessment/Plan:   Orders Placed This Encounter  Procedures   Hemoglobin A1c   Insulin, random   Lipid Panel With LDL/HDL Ratio   VITAMIN D 25 Hydroxy (Vit-D Deficiency, Fractures)    Medications Discontinued During This Encounter  Medication Reason   Semaglutide, 2 MG/DOSE, (OZEMPIC, 2 MG/DOSE,) 8 MG/3ML SOPN Reorder     Meds ordered this encounter  Medications   Semaglutide, 2 MG/DOSE, (OZEMPIC, 2 MG/DOSE,) 8 MG/3ML SOPN    Sig: Inject 2 mg into the skin once a week.    Dispense:  3 mL    Refill:  0    1 mo supply;OV for RF     1. Pre-diabetes [name] will continue to work on weight loss, exercise, and decreasing simple carbohydrates to help decrease the risk of diabetes.  Check labs prior to next OV.  Refill- Semaglutide, 2 MG/DOSE, (OZEMPIC, 2 MG/DOSE,) 8 MG/3ML SOPN; Inject 2 mg into the skin once a week.  Dispense: 3 mL; Refill: 0  - Hemoglobin A1c - Insulin, random  2. Other hyperlipidemia Cardiovascular risk and specific lipid/LDL goals reviewed.  We discussed several lifestyle modifications today and  [name] will continue to work on diet, exercise and weight loss efforts. Orders and follow up as documented in patient record.   Counseling Intensive lifestyle modifications are the first line treatment for this issue. Dietary changes: Increase soluble fiber. Decrease simple carbohydrates. Exercise changes: Moderate to vigorous-intensity aerobic activity 150 minutes per week if tolerated. Lipid-lowering medications: see documented in medical record. Check labs prior to next  OV.  - Lipid Panel With LDL/HDL Ratio  3. Vitamin D deficiency Low Vitamin D level contributes to fatigue and are associated with obesity, breast, and colon cancer. She agrees to continue to take OTC Vitamin D 5,000 IU QD and will follow-up for routine testing of Vitamin D, at least 2-3 times per year to avoid over-replacement. Check labs prior to next OV.  - VITAMIN D 25 Hydroxy (Vit-D Deficiency, Fractures)  4. At risk for impaired metabolic function [name] was given approximately 9 minutes of impaired  metabolic function prevention counseling today. We discussed intensive lifestyle modifications today with an emphasis on specific nutrition and exercise instructions and strategies.   Repetitive spaced learning was employed today to elicit superior memory formation and behavioral change.   5. Obesity with current BMI of 33.45  [name] is currently in the action stage of change. As such, her goal is to continue with weight loss efforts. She has agreed to the Category 2 Plan.   Exercise goals:  As is  Behavioral modification strategies: travel eating strategies and planning for success.  [name] has agreed to follow-up with our clinic in 2-3 weeks- come fasting for bloodwork. She was informed of the importance of frequent follow-up visits to maximize her success with intensive lifestyle modifications for her multiple health conditions.  [name] was informed we would discuss her lab results at her next visit unless there is a critical issue that needs to be addressed sooner. [name] agreed to keep her next visit at the agreed upon time to discuss these results.  Objective:   VITALS: Per patient if applicable, see vitals. GENERAL: Alert and in no acute distress. CARDIOPULMONARY: No increased WOB. Speaking in clear sentences.  PSYCH: Pleasant and cooperative. Speech normal rate and rhythm. Affect is appropriate. Insight and judgement are appropriate. Attention is focused, linear, and  appropriate.  NEURO: Oriented as arrived to appointment on time with no prompting.   Lab Results  Component Value Date   CREATININE 0.49 (L) 02/15/2021   BUN 8 02/15/2021   NA 142 02/15/2021   K 4.9 02/15/2021   CL 106 02/15/2021   CO2 24 02/15/2021   Lab Results  Component Value Date   ALT 14 12/26/2020   AST 13 12/26/2020   ALKPHOS 75 07/24/2020   BILITOT 0.4 12/26/2020   Lab Results  Component Value Date   HGBA1C 5.4 07/24/2020   HGBA1C 5.2 03/31/2020   HGBA1C 5.4 10/15/2019   HGBA1C 5.4 10/23/2018   HGBA1C 5.4 09/12/2017   Lab Results  Component Value Date   INSULIN 12.5 07/24/2020   INSULIN 25.0 (H) 04/05/2020   Lab Results  Component Value Date   TSH 2.82 12/26/2020   Lab Results  Component Value Date   CHOL 242 (H) 07/24/2020   HDL 57 07/24/2020   LDLCALC 136 (H) 07/24/2020   LDLDIRECT 194.4 09/10/2010   TRIG 274 (H) 07/24/2020   CHOLHDL 4.2 07/24/2020   Lab Results  Component Value Date   VD25OH 37.0 07/24/2020   VD25OH 47.5 04/05/2020   VD25OH 45 11/22/2019   Lab Results  Component  Value Date   WBC 8.6 02/20/2021   HGB 15.0 02/20/2021   HCT 43.3 02/20/2021   MCV 92.1 02/20/2021   PLT 345 02/20/2021   Lab Results  Component Value Date   IRON 91 05/01/2015   TIBC 376 05/01/2015   FERRITIN 60 06/27/2015    Attestation Statements:   Reviewed by clinician on day of visit: allergies, medications, problem list, medical history, surgical history, family history, social history, and previous encounter notes.  Coral Ceo, CMA, am acting as transcriptionist for [employer], DO.  I have reviewed the above documentation for accuracy and completeness, and I agree with the above. Marjory Sneddon, D.O.  The [place] was signed into law in 2016 which includes the topic of electronic health records.  This provides immediate access to information in MyChart.  This includes consultation notes, operative notes, office notes,  lab results and pathology reports.  If you have any questions about what you read please let us know at your next visit so we can discuss your concerns and take corrective action if need be.  We are right here with you.

## 2021-03-14 ENCOUNTER — Ambulatory Visit (INDEPENDENT_AMBULATORY_CARE_PROVIDER_SITE_OTHER): Payer: No Typology Code available for payment source | Admitting: Physician Assistant

## 2021-03-14 ENCOUNTER — Encounter: Payer: Self-pay | Admitting: Physician Assistant

## 2021-03-14 ENCOUNTER — Ambulatory Visit (INDEPENDENT_AMBULATORY_CARE_PROVIDER_SITE_OTHER): Payer: No Typology Code available for payment source

## 2021-03-14 ENCOUNTER — Ambulatory Visit: Payer: No Typology Code available for payment source

## 2021-03-14 ENCOUNTER — Other Ambulatory Visit: Payer: Self-pay

## 2021-03-14 VITALS — BP 118/78 | HR 78 | Temp 98.6°F | Ht 65.0 in | Wt 202.0 lb

## 2021-03-14 DIAGNOSIS — R109 Unspecified abdominal pain: Secondary | ICD-10-CM | POA: Diagnosis not present

## 2021-03-14 DIAGNOSIS — R1030 Lower abdominal pain, unspecified: Secondary | ICD-10-CM | POA: Diagnosis not present

## 2021-03-14 DIAGNOSIS — K5900 Constipation, unspecified: Secondary | ICD-10-CM | POA: Diagnosis not present

## 2021-03-14 DIAGNOSIS — R197 Diarrhea, unspecified: Secondary | ICD-10-CM

## 2021-03-14 DIAGNOSIS — Z1159 Encounter for screening for other viral diseases: Secondary | ICD-10-CM | POA: Diagnosis not present

## 2021-03-14 DIAGNOSIS — R1032 Left lower quadrant pain: Secondary | ICD-10-CM | POA: Diagnosis not present

## 2021-03-14 LAB — POCT URINALYSIS DIP (CLINITEK)
Blood, UA: NEGATIVE
Glucose, UA: NEGATIVE mg/dL
Ketones, POC UA: NEGATIVE mg/dL
Leukocytes, UA: NEGATIVE
Nitrite, UA: NEGATIVE
POC PROTEIN,UA: NEGATIVE
Spec Grav, UA: 1.025 (ref 1.010–1.025)
Urobilinogen, UA: 0.2 E.U./dL
pH, UA: 5.5 (ref 5.0–8.0)

## 2021-03-14 MED ORDER — IOHEXOL 300 MG/ML  SOLN
100.0000 mL | Freq: Once | INTRAMUSCULAR | Status: AC | PRN
  Administered 2021-03-14: 100 mL via INTRAVENOUS

## 2021-03-14 NOTE — Addendum Note (Signed)
Addended by: Donella Stade on: 03/14/2021 11:54 AM   Modules accepted: Orders

## 2021-03-14 NOTE — Progress Notes (Signed)
[name],   No diverticulitis.   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  This is benign and self limiting. Treatment is pain control for the next 3-14 days with ibuprofen and tylenol. Can you tolerate NSAIDs '800mg'$  up to three times a day alternated with tylenol. Let me know if pain increases.

## 2021-03-14 NOTE — Progress Notes (Addendum)
Subjective:    Patient ID: [name], female    DOB: 07-Apr-1971, 50 y.o.   MRN: [id number]  HPI Pt is a 50 yo obese female who presents to the clinic with left lower abdominal pain that suddenly started Sunday night. Sunday pain was 5/10 but now it is more 3-4/10. Pain is worsened by movement and changing positions. No trauma or injury or new diet or exercises. Denies any fever, chills body aches. Her stools are loose but denies any melena or hematochezia. No nausea or vomiting. No urinary symptoms or flank pain. She is trying to eat a bland diet but abdominal pain persist.   Never had diverticulitis but had colitis in the right colon in 2009.   .. Active Ambulatory Problems    Diagnosis Date Noted   Anxiety state 06/08/2007   Essential hypertension 06/08/2007   PALPITATIONS 04/26/2008   Bilateral lower abdominal cramping 11/04/2007   LUMBAR STRAIN 06/10/2007   Breast mass, right 01/19/2014   Sinus tachycardia 01/26/2014   Hyperlipidemia 01/31/2014   PVC's (premature ventricular contractions) 05/31/2014   Weight gain 09/19/2014   Other fatigue 09/19/2014   Dry skin 09/19/2014   OSA (obstructive sleep apnea) 04/03/2015   DUB (dysfunctional uterine bleeding) 06/28/2015   Family history of breast cancer 12/12/2015   Family history of melanoma 12/12/2015   Right thyroid nodule 01/29/2016   Uveitis, intermediate 02/29/2016   Mass in neck 05/21/2016   Chronic fatigue syndrome 09/17/2016   Obesity (BMI 35.0-39.9 without comorbidity) 09/19/2016   Drug-induced constipation 11/18/2016   Dyslipidemia (high LDL; low HDL) 01/13/2017   Vitamin D deficiency 09/14/2017   Prediabetes 02/13/2018   Pelvic pain 02/13/2018   Colon cancer high risk 02/13/2018   Insulin resistance 06/21/2018   Depressed mood 10/27/2018   Class 1 obesity with serious comorbidity and body mass index (BMI) of 34.0 to 34.9 in adult 10/27/2018   Left foot pain 09/29/2019   COVID-19 virus infection  [id number]   Nonalcoholic hepatosteatosis XX123456   Metabolic syndrome [id number]   At risk for side effect of medication 12/13/2020   Menorrhagia with irregular cycle 12/26/2020   Class 1 obesity due to excess calories without serious comorbidity with body mass index (BMI) of 34.0 to 34.9 in adult 12/27/2020   Acne vulgaris 12/27/2020   At risk for activity intolerance 01/01/2021   Thickened endometrium 01/02/2021   Uterine fibroid 01/02/2021   Irregular bleeding    Diarrhea 03/14/2021   Left lower quadrant abdominal pain 03/14/2021   Resolved Ambulatory Problems    Diagnosis Date Noted   Redness of left eye 12/12/2015   Past Medical History:  Diagnosis Date   Anemia    Anxiety    Arthritis    Back pain    Gallbladder problem    High cholesterol    Hypertension    Hypothyroidism    IBS (irritable bowel syndrome)    Joint pain    Low vitamin D level    Obesity (BMI 30-39.9)    PCOS (polycystic ovarian syndrome)       Review of Systems See HPI.     Objective:   Physical Exam Vitals reviewed.  Constitutional:      Appearance: Normal appearance. She is obese.  HENT:     Head: Normocephalic.  Cardiovascular:     Rate and Rhythm: Normal rate and regular rhythm.     Pulses: Normal pulses.  Abdominal:     General: There is no distension.  Palpations: Abdomen is soft. There is no mass.     Tenderness: There is abdominal tenderness. There is guarding. There is no right CVA tenderness, left CVA tenderness or rebound.     Comments: Guarding over left lower and left mid quadrant to palpation.   Musculoskeletal:     Right lower leg: No edema.     Left lower leg: No edema.  Neurological:     General: No focal deficit present.     Mental Status: She is alert.  Psychiatric:        Mood and Affect: Mood normal.         .. Results for orders placed or performed in visit on 03/14/21  POCT URINALYSIS DIP (CLINITEK)  Result Value Ref Range   Color, UA yellow  yellow   Clarity, UA clear clear   Glucose, UA negative negative mg/dL   Bilirubin, UA small (A) negative   Ketones, POC UA negative negative mg/dL   Spec Grav, UA 1.025 1.010 - 1.025   Blood, UA negative negative   pH, UA 5.5 5.0 - 8.0   POC PROTEIN,UA negative negative, trace   Urobilinogen, UA 0.2 0.2 or 1.0 E.U./dL   Nitrite, UA Negative Negative   Leukocytes, UA Negative Negative    Assessment & Plan:  Marland KitchenMarland KitchenMaddisyn was seen today for abdominal pain.  Diagnoses and all orders for this visit:  Left lower quadrant abdominal pain -     COMPLETE METABOLIC PANEL WITH GFR -     Lipase -     CBC with Differential/Platelet  Abdominal pain, unspecified abdominal location -     POCT URINALYSIS DIP (CLINITEK) -     Urine Culture -     COMPLETE METABOLIC PANEL WITH GFR -     Lipase -     CBC with Differential/Platelet  Need for hepatitis C screening test -     Hepatitis C antibody  Lower abdominal pain -     COMPLETE METABOLIC PANEL WITH GFR -     Lipase -     CBC with Differential/Platelet -     CT Abdomen Pelvis Wo Contrast; Future -     CT Abdomen Pelvis W Contrast; Future  Diarrhea, unspecified type   Left lower abdominal pain and tenderness and guarding on exam. Concern for diverticulitis. Will get labs and CT.  UA positive for bilirubin.  Will call with results.

## 2021-03-15 LAB — CBC WITH DIFFERENTIAL/PLATELET
Absolute Monocytes: 581 cells/uL (ref 200–950)
Basophils Absolute: 61 cells/uL (ref 0–200)
Basophils Relative: 0.6 %
Eosinophils Absolute: 133 cells/uL (ref 15–500)
Eosinophils Relative: 1.3 %
HCT: 42.2 % (ref 35.0–45.0)
Hemoglobin: 14.4 g/dL (ref 11.7–15.5)
Lymphs Abs: 2652 cells/uL (ref 850–3900)
MCH: 31.9 pg (ref 27.0–33.0)
MCHC: 34.1 g/dL (ref 32.0–36.0)
MCV: 93.6 fL (ref 80.0–100.0)
MPV: 10.5 fL (ref 7.5–12.5)
Monocytes Relative: 5.7 %
Neutro Abs: 6773 cells/uL (ref 1500–7800)
Neutrophils Relative %: 66.4 %
Platelets: 384 10*3/uL (ref 140–400)
RBC: 4.51 10*6/uL (ref 3.80–5.10)
RDW: 12.9 % (ref 11.0–15.0)
Total Lymphocyte: 26 %
WBC: 10.2 10*3/uL (ref 3.8–10.8)

## 2021-03-15 LAB — COMPLETE METABOLIC PANEL WITH GFR
AG Ratio: 1.7 (calc) (ref 1.0–2.5)
ALT: 17 U/L (ref 6–29)
AST: 15 U/L (ref 10–35)
Albumin: 4.4 g/dL (ref 3.6–5.1)
Alkaline phosphatase (APISO): 62 U/L (ref 37–153)
BUN: 8 mg/dL (ref 7–25)
CO2: 27 mmol/L (ref 20–32)
Calcium: 9.4 mg/dL (ref 8.6–10.4)
Chloride: 104 mmol/L (ref 98–110)
Creat: 0.51 mg/dL (ref 0.50–1.03)
Globulin: 2.6 g/dL (calc) (ref 1.9–3.7)
Glucose, Bld: 87 mg/dL (ref 65–99)
Potassium: 4.7 mmol/L (ref 3.5–5.3)
Sodium: 138 mmol/L (ref 135–146)
Total Bilirubin: 0.5 mg/dL (ref 0.2–1.2)
Total Protein: 7 g/dL (ref 6.1–8.1)
eGFR: 113 mL/min/{1.73_m2} (ref >=60)

## 2021-03-15 LAB — LIPASE: Lipase: 48 U/L (ref 7–60)

## 2021-03-15 LAB — HEPATITIS C ANTIBODY
Hepatitis C Ab: NONREACTIVE
SIGNAL TO CUT-OFF: 0.01 (ref <1.00)

## 2021-03-15 NOTE — Progress Notes (Signed)
Estephanie,   Kidney, liver, glucose look great.  Lipase, pancreatic enzyme, looks perfect.  Normal CBC.

## 2021-03-16 LAB — URINE CULTURE
MICRO NUMBER:: 12200095
SPECIMEN QUALITY:: ADEQUATE

## 2021-03-16 NOTE — Progress Notes (Signed)
No signs of abnormal bacteria growth in urine culture.

## 2021-03-26 ENCOUNTER — Encounter (INDEPENDENT_AMBULATORY_CARE_PROVIDER_SITE_OTHER): Payer: Self-pay

## 2021-03-26 ENCOUNTER — Ambulatory Visit (INDEPENDENT_AMBULATORY_CARE_PROVIDER_SITE_OTHER): Payer: No Typology Code available for payment source | Admitting: Family Medicine

## 2021-03-29 ENCOUNTER — Other Ambulatory Visit: Payer: Self-pay

## 2021-03-29 ENCOUNTER — Encounter (HOSPITAL_BASED_OUTPATIENT_CLINIC_OR_DEPARTMENT_OTHER): Payer: Self-pay | Admitting: Obstetrics & Gynecology

## 2021-03-29 ENCOUNTER — Telehealth (INDEPENDENT_AMBULATORY_CARE_PROVIDER_SITE_OTHER): Payer: No Typology Code available for payment source | Admitting: Obstetrics & Gynecology

## 2021-03-29 DIAGNOSIS — N840 Polyp of corpus uteri: Secondary | ICD-10-CM | POA: Diagnosis not present

## 2021-03-29 DIAGNOSIS — Z9189 Other specified personal risk factors, not elsewhere classified: Secondary | ICD-10-CM | POA: Diagnosis not present

## 2021-03-29 DIAGNOSIS — Z9889 Other specified postprocedural states: Secondary | ICD-10-CM | POA: Diagnosis not present

## 2021-03-29 NOTE — Progress Notes (Signed)
Virtual Visit via Video Note  I connected with [name] on 03/29/21 at 11:30 AM EDT by a video enabled telemedicine application and verified that I am speaking with the correct person using two identifiers.  Location: Patient: home Provider: office   I discussed the limitations of evaluation and management by telemedicine and the availability of in person appointments. The patient expressed understanding and agreed to proceed.  History of Present Illness: 50 yo G3P3 MWF for post op visit via virtual visit.  Pt had hysteroscopy with polyp resection and D&C on July 13.  Pathology showed endometrial polyp with proliferative endometrium.  Patient reported that she had some bleeding about 7 days postop.  It was never heavy.  She took 1 pain medication pill and that was all.  She is felt good otherwise.  She did have a normal cycle in August.  Patient reports she had an episode of upper abdominal pain that lasted for 3 to 4 days.  This was in late July and she ended up seeing her primary care doctor.  A CT was ordered.  The CAT scan did show possible appendagitis epiploica or less likely an omental infarct.  Pain resolved spontaneously.  She did have some questions about findings on the uterus.  Nabothian cysts have been seen on the prior ultrasound and were noted again on the CT.  Questions about this were answered.  The endometrium was about 33m.  Previously it had been 23 mm.  She had some small left ovarian cystic follicles.  Patient also had a breast biopsy in June.  She reports that she still does have some pain at the biopsy site from time to time.  This is cyclical.  Advised this is quite common.  We are planning a breast MRI in December and my office will reach out to her at that time regarding scheduling.   Observations/Objective: Well-nourished well-developed white female.  No acute distress.  Assessment and Plan: 1. Status post hysteroscopy - doing well  2. Endometrial  polyp - pathology was benign.  No specific follow up recommeded unless has changes with bleeding  3. Increased risk of breast cancer - Breast MRI planned for end of the year.   Follow Up Instructions: I discussed the assessment and treatment plan with the patient. The patient was provided an opportunity to ask questions and all were answered. The patient agreed with the plan and demonstrated an understanding of the instructions.   The patient was advised to call back or seek an in-person evaluation if the symptoms worsen or if the condition fails to improve as anticipated.  I provided 11 minutes of non-face-to-face time during this encounter.   MMegan Salon MD

## 2021-03-30 ENCOUNTER — Telehealth (INDEPENDENT_AMBULATORY_CARE_PROVIDER_SITE_OTHER): Payer: No Typology Code available for payment source | Admitting: Physician Assistant

## 2021-03-30 ENCOUNTER — Encounter: Payer: Self-pay | Admitting: Physician Assistant

## 2021-03-30 VITALS — BP 114/83 | HR 76 | Temp 98.6°F | Ht 65.0 in | Wt 196.0 lb

## 2021-03-30 DIAGNOSIS — E6609 Other obesity due to excess calories: Secondary | ICD-10-CM | POA: Diagnosis not present

## 2021-03-30 DIAGNOSIS — H9313 Tinnitus, bilateral: Secondary | ICD-10-CM | POA: Diagnosis not present

## 2021-03-30 DIAGNOSIS — H9193 Unspecified hearing loss, bilateral: Secondary | ICD-10-CM | POA: Diagnosis not present

## 2021-03-30 DIAGNOSIS — Z6832 Body mass index (BMI) 32.0-32.9, adult: Secondary | ICD-10-CM

## 2021-03-30 DIAGNOSIS — E8881 Metabolic syndrome: Secondary | ICD-10-CM

## 2021-03-30 MED ORDER — TIRZEPATIDE 7.5 MG/0.5ML ~~LOC~~ SOAJ: 7.5000 mg | SUBCUTANEOUS | 0 refills | Status: DC

## 2021-03-30 NOTE — Progress Notes (Signed)
..Virtual Visit via Video Note  I connected with [name] on 04/02/21 at  1:40 PM EDT by a video enabled telemedicine application and verified that I am speaking with the correct person using two identifiers.  Location: Patient: home Provider: clinic  .Marland KitchenParticipating in visit:  Patient: [name] Provider: Iran Planas PA-C   I discussed the limitations of evaluation and management by telemedicine and the availability of in person appointments. The patient expressed understanding and agreed to proceed.  History of Present Illness: Patient is a 50 year old obese female with hypertension, PVCs, OSA, insulin resistance, metabolic syndrome who presents to the clinic to discuss medications for weight loss and insulin resistance/metabolic syndrome.  She is currently on Ozempic 2 mg weekly.  She would like to have a prescription for mournjara.  She is working hard to stay active at least 3-4 times a week and working on portion control.  Ozempic has helped her a lot but she feels like she has plateaued in efficacy.  Patient also requests referral to ear nose and throat for bilateral tinnitus and perceived hearing loss.  Her husband and family always says she keeps the TV loud and she often just cannot hear. She thought it would get better but just getting worse.    Pain resolved for epiglottica appendage.   .. Active Ambulatory Problems    Diagnosis Date Noted   Anxiety state 06/08/2007   Essential hypertension 06/08/2007   PALPITATIONS 04/26/2008   Bilateral lower abdominal cramping 11/04/2007   LUMBAR STRAIN 06/10/2007   Breast mass, right 01/19/2014   Sinus tachycardia 01/26/2014   Hyperlipidemia 01/31/2014   PVC's (premature ventricular contractions) 05/31/2014   Other fatigue 09/19/2014   Dry skin 09/19/2014   OSA (obstructive sleep apnea) 04/03/2015   Family history of breast cancer 12/12/2015   Family history of melanoma 12/12/2015   Right thyroid nodule 01/29/2016    Uveitis, intermediate 02/29/2016   Mass in neck 05/21/2016   Chronic fatigue syndrome 09/17/2016   Obesity (BMI 35.0-39.9 without comorbidity) 09/19/2016   Drug-induced constipation 11/18/2016   Dyslipidemia (high LDL; low HDL) 01/13/2017   Vitamin D deficiency 09/14/2017   Prediabetes 02/13/2018   Colon cancer high risk 02/13/2018   Insulin resistance 06/21/2018   Depressed mood 10/27/2018   Class 1 obesity with serious comorbidity and body mass index (BMI) of 34.0 to 34.9 in adult 10/27/2018   Left foot pain 09/29/2019   COVID-19 virus infection [id number]   Nonalcoholic hepatosteatosis [id number]   Metabolic syndrome [id number]   At risk for side effect of medication 12/13/2020   Class 1 obesity due to excess calories without serious comorbidity with body mass index (BMI) of 32.0 to 32.9 in adult 12/27/2020   Acne vulgaris 12/27/2020   At risk for activity intolerance 01/01/2021   Uterine fibroid 01/02/2021   Diarrhea 03/14/2021   Left lower quadrant abdominal pain 03/14/2021   Bilateral hearing loss 03/30/2021   Tinnitus of both ears 03/30/2021   Resolved Ambulatory Problems    Diagnosis Date Noted   Weight gain 09/19/2014   DUB (dysfunctional uterine bleeding) 06/28/2015   Redness of left eye 12/12/2015   Pelvic pain 02/13/2018   Menorrhagia with irregular cycle 12/26/2020   Thickened endometrium 01/02/2021   Irregular bleeding    Past Medical History:  Diagnosis Date   Anemia    Anxiety    Arthritis    Back pain    Gallbladder problem    High cholesterol    Hypertension    Hypothyroidism  IBS (irritable bowel syndrome)    Joint pain    Low vitamin D level    Obesity (BMI 30-39.9)    PCOS (polycystic ovarian syndrome)     Observations/Objective: No acute distress Normal mood and appearance Normal breathing  .[name] Today's Vitals   03/30/21 1302  BP: 114/83  Pulse: 76  Temp: 98.6 F (37 C)  TempSrc: Oral  Weight: 196 lb (88.9 kg)  Height: '5\' 5"'$   (1.651 m)   Body mass index is 32.62 kg/m.     Assessment and Plan: Marland KitchenMarland KitchenDiagnoses and all orders for this visit:  Metabolic syndrome -     tirzepatide (MOUNJARO) 7.5 MG/0.5ML Pen; Inject 7.5 mg into the skin once a week.  Tinnitus of both ears -     Ambulatory referral to ENT  Insulin resistance -     tirzepatide (MOUNJARO) 7.5 MG/0.5ML Pen; Inject 7.5 mg into the skin once a week.  Bilateral hearing loss, unspecified hearing loss type -     Ambulatory referral to ENT  Class 1 obesity due to excess calories without serious comorbidity with body mass index (BMI) of 32.0 to 32.9 in adult  Mounjaro prescribed to replace ozempic. Discussed side effects. Continue to be once a week and can titrate up.  [name].Discussed low carb diet with 1500 calories and 80g of protein.  Exercising at least 150 minutes a week.  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  Continue metformin.    Virtual visit cannot do hearing test. Referral made. Consider flonase daily.    Follow Up Instructions:    I discussed the assessment and treatment plan with the patient. The patient was provided an opportunity to ask questions and all were answered. The patient agreed with the plan and demonstrated an understanding of the instructions.   The patient was advised to call back or seek an in-person evaluation if the symptoms worsen or if the condition fails to improve as anticipated.    Iran Planas, PA-C

## 2021-03-30 NOTE — Progress Notes (Signed)
Wants to discuss switching from Ozempic to Mounjaro (sp?)  Tinnitis in both ears - been going on a long time, getting worse, referral to ENT?

## 2021-03-30 NOTE — Patient Instructions (Signed)
Tinnitus Tinnitus refers to hearing a sound when there is no actual source for that sound. This is often described as ringing in the ears. However, people withthis condition may hear a variety of noises, in one ear or in both ears. The sounds of tinnitus can be soft, loud, or somewhere in between. Tinnitus can last for a few seconds or can be constant for days. It may go away without treatment and come back at various times. When tinnitus is constant or happens often, it can lead to other problems, such as trouble sleeping and troubleconcentrating. Almost everyone experiences tinnitus at some point. Tinnitus is not the same as hearing loss. Tinnitus that is long-lasting (chronic) or comes back often (recurs) may require medical attention. What are the causes? The cause of tinnitus is often not known. In some cases, it can result from: Exposure to loud noises from machinery, music, or other sources. An object (foreign body) stuck in the ear. Earwax buildup. Drinking alcohol or caffeine. Taking certain medicines. Age-related hearing loss. It may also be caused by medical conditions such as: Ear or sinus infections. Heart diseases or high blood pressure. Allergies. Mnire's disease. Thyroid problems. Tumors. A weak, bulging blood vessel (aneurysm) near the ear. What increases the risk? The following factors may make you more likely to develop this condition: Exposure to loud noises. Age. Tinnitus is more likely in older individuals. Using alcohol or tobacco. What are the signs or symptoms? The main symptom of tinnitus is hearing a sound when there is no source for that sound. It may sound like: Buzzing. Sizzling. Ringing. Blowing air. Hissing. Whistling. Other sounds may include: Roaring. Running water. A musical note. Tapping. Humming. Symptoms may affect only one ear (unilateral) or both ears (bilateral). How is this diagnosed? Tinnitus is diagnosed based on your symptoms,  your medical history, and a physical exam. Your health care provider may do a thorough hearing test (audiologic exam) if your tinnitus: Is unilateral. Causes hearing difficulties. Lasts 6 months or longer. You may work with a health care provider who specializes in hearing disorders (audiologist). You may be asked questions about your symptoms and how they affect your daily life. You may have other tests done, such as: CT scan. MRI. An imaging test of how blood flows through your blood vessels (angiogram). How is this treated? Treating an underlying medical condition can sometimes make tinnitus go away. If your tinnitus continues, other treatments may include: Therapy and counseling to help you manage the stress of living with tinnitus. Sound generators to mask the tinnitus. These include: Tabletop sound machines that play relaxing sounds to help you fall asleep. Wearable devices that fit in your ear and play sounds or music. Acoustic neural stimulation. This involves using headphones to listen to music that contains an auditory signal. Over time, listening to this signal may change some pathways in your brain and make you less sensitive to tinnitus. This treatment is used for very severe cases when no other treatment is working. Using hearing aids or cochlear implants if your tinnitus is related to hearing loss. Hearing aids are worn in the outer ear. Cochlear implants are surgically placed in the inner ear. Follow these instructions at home: Managing symptoms     When possible, avoid being in loud places and being exposed to loud sounds. Wear hearing protection, such as earplugs, when you are exposed to loud noises. Use a white noise machine, a humidifier, or other devices to mask the sound of tinnitus. Practice techniques  for reducing stress, such as meditation, yoga, or deep breathing. Work with your health care provider if you need help with managing stress. Sleep with your head  slightly raised. This may reduce the impact of tinnitus. General instructions Do not use stimulants, such as nicotine, alcohol, or caffeine. Talk with your health care provider about other stimulants to avoid. Stimulants are substances that can make you feel alert and attentive by increasing certain activities in the body (such as heart rate and blood pressure). These substances may make tinnitus worse. Take over-the-counter and prescription medicines only as told by your health care provider. Try to get plenty of sleep each night. Keep all follow-up visits. This is important. Contact a health care provider if: Your tinnitus continues for 3 weeks or longer without stopping. You develop sudden hearing loss. Your symptoms get worse or do not get better with home care. You feel you are not able to manage the stress of living with tinnitus. Get help right away if: You develop tinnitus after a head injury. You have tinnitus along with any of the following: Dizziness. Nausea and vomiting. Loss of balance. Sudden, severe headache. Vision changes. Facial weakness or weakness of arms or legs. These symptoms may represent a serious problem that is an emergency. Do not wait to see if the symptoms will go away. Get medical help right away. Call your local emergency services (911 in the [place] Do not drive yourself to the hospital. Summary Tinnitus refers to hearing a sound when there is no actual source for that sound. This is often described as ringing in the ears. Symptoms may affect only one ear (unilateral) or both ears (bilateral). Use a white noise machine, a humidifier, or other devices to mask the sound of tinnitus. Do not use stimulants, such as nicotine, alcohol, or caffeine. These substances may make tinnitus worse. This information is not intended to replace advice given to you by your health care provider. Make sure you discuss any questions you have with your healthcare  provider. Document Revised: 07/03/2020 Document Reviewed: 07/03/2020 Elsevier Patient Education  2022 [employer].

## 2021-04-02 ENCOUNTER — Encounter: Payer: Self-pay | Admitting: Physician Assistant

## 2021-04-05 ENCOUNTER — Encounter: Payer: Self-pay | Admitting: Physician Assistant

## 2021-04-05 DIAGNOSIS — E6609 Other obesity due to excess calories: Secondary | ICD-10-CM

## 2021-04-05 DIAGNOSIS — Z6836 Body mass index (BMI) 36.0-36.9, adult: Secondary | ICD-10-CM

## 2021-04-05 DIAGNOSIS — E8881 Metabolic syndrome: Secondary | ICD-10-CM

## 2021-04-05 MED ORDER — OZEMPIC (1 MG/DOSE) 2 MG/1.5ML ~~LOC~~ SOPN: 1.0000 mg | PEN_INJECTOR | SUBCUTANEOUS | 0 refills | Status: DC

## 2021-04-05 NOTE — Telephone Encounter (Signed)
[place] sent her Ozempic. She also left a vm.   Cyril Mourning - can you work on PA when available?

## 2021-04-10 ENCOUNTER — Other Ambulatory Visit: Payer: Self-pay | Admitting: Physician Assistant

## 2021-04-10 DIAGNOSIS — I493 Ventricular premature depolarization: Secondary | ICD-10-CM

## 2021-04-10 DIAGNOSIS — F411 Generalized anxiety disorder: Secondary | ICD-10-CM

## 2021-04-10 DIAGNOSIS — R7303 Prediabetes: Secondary | ICD-10-CM

## 2021-04-10 DIAGNOSIS — K760 Fatty (change of) liver, not elsewhere classified: Secondary | ICD-10-CM

## 2021-04-10 NOTE — Telephone Encounter (Signed)
Last dose was 1 mg.. should she be on 2 mg?

## 2021-04-17 MED ORDER — SEMAGLUTIDE (2 MG/DOSE) 8 MG/3ML ~~LOC~~ SOPN: 2.0000 mg | PEN_INJECTOR | SUBCUTANEOUS | 2 refills | Status: DC

## 2021-04-17 NOTE — Telephone Encounter (Signed)
Ok to send '2mg'$  weekly shots.

## 2021-04-17 NOTE — Addendum Note (Signed)
Addended byAnnamaria Helling on: 04/17/2021 02:28 PM   Modules accepted: Orders

## 2021-04-17 NOTE — Telephone Encounter (Signed)
RX sent

## 2021-04-17 NOTE — Addendum Note (Signed)
Addended by: Fonnie Mu on: 04/17/2021 01:22 PM   Modules accepted: Orders

## 2021-04-19 ENCOUNTER — Telehealth: Payer: Self-pay

## 2021-04-19 NOTE — Telephone Encounter (Signed)
Using covermymeds, attempted a PA for Mounjaro injections. Received the following statement:   PA has already submitted and is in process for this patient and drug.;[id number]:In Process;

## 2021-04-27 ENCOUNTER — Other Ambulatory Visit: Payer: Self-pay | Admitting: Physician Assistant

## 2021-04-27 DIAGNOSIS — F411 Generalized anxiety disorder: Secondary | ICD-10-CM

## 2021-06-08 ENCOUNTER — Telehealth: Payer: Self-pay

## 2021-06-08 NOTE — Telephone Encounter (Signed)
Medication: tirzepatide Darcel Bayley) 7.5 MG/0.5ML Pen Prior authorization submitted via CoverMyMeds on 06/08/2021 PA submission pending

## 2021-06-14 NOTE — Telephone Encounter (Signed)
Medication: tirzepatide (MOUNJARO) 7.5 MG/0.5ML Pen Prior authorization determination received Medication has been denied Reason for denial:  "Coverage is provided for type 2 diabetes mellitus"

## 2021-06-19 ENCOUNTER — Encounter: Payer: Self-pay | Admitting: Physician Assistant

## 2021-06-19 ENCOUNTER — Telehealth (INDEPENDENT_AMBULATORY_CARE_PROVIDER_SITE_OTHER): Payer: No Typology Code available for payment source | Admitting: Physician Assistant

## 2021-06-19 VITALS — BP 114/76 | HR 76 | Ht 65.0 in | Wt 197.0 lb

## 2021-06-19 DIAGNOSIS — M5126 Other intervertebral disc displacement, lumbar region: Secondary | ICD-10-CM

## 2021-06-19 DIAGNOSIS — M5441 Lumbago with sciatica, right side: Secondary | ICD-10-CM | POA: Diagnosis not present

## 2021-06-19 MED ORDER — PREDNISONE 50 MG PO TABS
ORAL_TABLET | ORAL | 0 refills | Status: DC
Start: 2021-06-19 — End: 2021-06-27

## 2021-06-19 MED ORDER — HYDROCODONE-ACETAMINOPHEN 5-325 MG PO TABS: 1.0000 | ORAL_TABLET | ORAL | 0 refills | Status: AC | PRN

## 2021-06-19 MED ORDER — CYCLOBENZAPRINE HCL 10 MG PO TABS
10.0000 mg | ORAL_TABLET | Freq: Three times a day (TID) | ORAL | 0 refills | Status: DC | PRN
Start: 2021-06-19 — End: 2021-06-27

## 2021-06-19 NOTE — Progress Notes (Signed)
Started yesterday Was having abdominal cramping, traveled around to back  Pain 5/10 while laying in bed with heating pad When she gets up to use restroom/move she states pain is "at a 50" 3-4 advil every 6 hours, helps pain when not moving  No recent injury  Herniated discs seen in the past

## 2021-06-19 NOTE — Progress Notes (Addendum)
Patient ID: [name], female   DOB: 1970/09/24, 50 y.o.   MRN: [id number] .Marland KitchenVirtual Visit via Video Note  I connected with [name] on 06/19/21 at 11:30 AM EST by a video enabled telemedicine application and verified that I am speaking with the correct person using two identifiers.  Location: Patient: home Provider: clinic  .Marland KitchenParticipating in visit:  Patient: [name] Provider: Iran Planas PA-C   I discussed the limitations of evaluation and management by telemedicine and the availability of in person appointments. The patient expressed understanding and agreed to proceed.  History of Present Illness: Pt is a 50 yo obese female who calls into the clinic with 10/10 low back pain radiating down the right leg stopping at the right knee. Pain decreases to 5/10 when laying flat with heating pad. Symptoms started  suddenly yesterday after going to the grocery store. No known injury. This happened once before years ago and took 1 week to get better and had to go to chiropractor. She is taking 4 advil every 6 hours and not helping. She went to use the bathroom this morning and urinated on herself. Her right leg feels weak. No saddle anesthesia.   [name]. Active Ambulatory Problems    Diagnosis Date Noted   Anxiety state 06/08/2007   Essential hypertension 06/08/2007   PALPITATIONS 04/26/2008   Bilateral lower abdominal cramping 11/04/2007   LUMBAR STRAIN 06/10/2007   Breast mass, right 01/19/2014   Sinus tachycardia 01/26/2014   Hyperlipidemia 01/31/2014   PVC's (premature ventricular contractions) 05/31/2014   Other fatigue 09/19/2014   Dry skin 09/19/2014   OSA (obstructive sleep apnea) 04/03/2015   Family history of breast cancer 12/12/2015   Family history of melanoma 12/12/2015   Right thyroid nodule 01/29/2016   Uveitis, intermediate 02/29/2016   Mass in neck 05/21/2016   Chronic fatigue syndrome 09/17/2016   Obesity (BMI 35.0-39.9 without comorbidity) 09/19/2016    Drug-induced constipation 11/18/2016   Dyslipidemia (high LDL; low HDL) 01/13/2017   Vitamin D deficiency 09/14/2017   Prediabetes 02/13/2018   Colon cancer high risk 02/13/2018   Insulin resistance 06/21/2018   Depressed mood 10/27/2018   Class 1 obesity with serious comorbidity and body mass index (BMI) of 34.0 to 34.9 in adult 10/27/2018   Left foot pain 09/29/2019   COVID-19 virus infection 24/82/5003   Nonalcoholic hepatosteatosis 70/48/8891   Metabolic syndrome 69/45/0388   At risk for side effect of medication 12/13/2020   Class 1 obesity due to excess calories without serious comorbidity with body mass index (BMI) of 32.0 to 32.9 in adult 12/27/2020   Acne vulgaris 12/27/2020   At risk for activity intolerance 01/01/2021   Uterine fibroid 01/02/2021   Diarrhea 03/14/2021   Left lower quadrant abdominal pain 03/14/2021   Bilateral hearing loss 03/30/2021   Tinnitus of both ears 03/30/2021   Acute right-sided low back pain with right-sided sciatica 06/19/2021   Resolved Ambulatory Problems    Diagnosis Date Noted   Weight gain 09/19/2014   DUB (dysfunctional uterine bleeding) 06/28/2015   Redness of left eye 12/12/2015   Pelvic pain 02/13/2018   Menorrhagia with irregular cycle 12/26/2020   Thickened endometrium 01/02/2021   Irregular bleeding    Past Medical History:  Diagnosis Date   Anemia    Anxiety    Arthritis    Back pain    Gallbladder problem    High cholesterol    Hypertension    Hypothyroidism    IBS (irritable bowel syndrome)  Joint pain    Low vitamin D level    Obesity (BMI 30-39.9)    PCOS (polycystic ovarian syndrome)     Observations/Objective: Laying in a bed with movement causing pain Reports 10/10 pain with any movement any lifting of right leg causes pain.   .. Today's Vitals   06/19/21 1032  BP: 114/76  Pulse: 76  Weight: 197 lb (89.4 kg)  Height: 5\' 5"  (1.651 m)   Body mass index is 32.78 kg/m.    Assessment and  Plan: Marland KitchenMarland KitchenClarene was seen today for back pain.  Diagnoses and all orders for this visit:  Acute right-sided low back pain with right-sided sciatica -     predniSONE (DELTASONE) 50 MG tablet; Take one tablet for 5 days. -     cyclobenzaprine (FLEXERIL) 10 MG tablet; Take 1 tablet (10 mg total) by mouth 3 (three) times daily as needed for muscle spasms. -     HYDROcodone-acetaminophen (NORCO/VICODIN) 5-325 MG tablet; Take 1 tablet by mouth every 4 (four) hours as needed for up to 5 days for moderate pain. -     MR LUMBAR SPINE WO CONTRAST; Future  Sudden low back pain with urinary dysfunction concerned for cauda equina.  MRI stat ordered.  Prednisone burst. Flexeril TID.  Norco every 4-6 hours sent to pharmacy.  Discussed heating pad and icy hot patches.  Follow up in 3 days in office with Dr. Darene Lamer.     Follow Up Instructions:    I discussed the assessment and treatment plan with the patient. The patient was provided an opportunity to ask questions and all were answered. The patient agreed with the plan and demonstrated an understanding of the instructions.   The patient was advised to call back or seek an in-person evaluation if the symptoms worsen or if the condition fails to improve as anticipated.   Iran Planas, PA-C

## 2021-06-20 ENCOUNTER — Telehealth: Payer: Self-pay | Admitting: Physician Assistant

## 2021-06-20 DIAGNOSIS — M5126 Other intervertebral disc displacement, lumbar region: Secondary | ICD-10-CM | POA: Insufficient documentation

## 2021-06-20 NOTE — Addendum Note (Signed)
Addended by: Donella Stade on: 06/20/2021 02:42 PM   Modules accepted: Orders

## 2021-06-20 NOTE — Progress Notes (Signed)
Patient ID: [name], female   DOB: 1971/02/28, 50 y.o.   MRN: [id number] MRI showed L4/L5 disc bulge with right paracentral disc protrusion contributes to mild spinal canal stenosis with narrowing of the right lateral recess. Mild right neural foraminal stenosis.   Formal PT ordered, continue on prednisone, muscle relaxer, norco and follow up with Dr. Darene Lamer in 1 week.

## 2021-06-22 ENCOUNTER — Ambulatory Visit (INDEPENDENT_AMBULATORY_CARE_PROVIDER_SITE_OTHER): Payer: No Typology Code available for payment source | Admitting: Physical Therapy

## 2021-06-22 ENCOUNTER — Other Ambulatory Visit: Payer: Self-pay

## 2021-06-22 ENCOUNTER — Encounter: Payer: Self-pay | Admitting: Physical Therapy

## 2021-06-22 DIAGNOSIS — R29898 Other symptoms and signs involving the musculoskeletal system: Secondary | ICD-10-CM | POA: Diagnosis not present

## 2021-06-22 DIAGNOSIS — M5441 Lumbago with sciatica, right side: Secondary | ICD-10-CM | POA: Diagnosis not present

## 2021-06-22 DIAGNOSIS — M5442 Lumbago with sciatica, left side: Secondary | ICD-10-CM | POA: Diagnosis not present

## 2021-06-22 DIAGNOSIS — M6281 Muscle weakness (generalized): Secondary | ICD-10-CM | POA: Diagnosis not present

## 2021-06-22 NOTE — Progress Notes (Signed)
Pt called and set up with formal PT and visit with Dr. Darene Lamer next week.

## 2021-06-22 NOTE — Telephone Encounter (Signed)
error 

## 2021-06-22 NOTE — Therapy (Signed)
[place], [place], [address] Phone: [phone]   Fax:  [phone]  Physical Therapy Evaluation  Patient Details  Name: [name] MRN: [id number] Date of Birth: 02/24/1971 Referring Provider (PT): Iran Planas   Encounter Date: 06/22/2021   PT End of Session - 06/22/21 1412     Visit Number 1    Number of Visits 12    Date for PT Re-Evaluation 08/03/21    PT Start Time 0092    PT Stop Time 1400    PT Time Calculation (min) 45 min    Activity Tolerance Patient tolerated treatment well    Behavior During Therapy [facility] for tasks assessed/performed             Past Medical History:  Diagnosis Date   Anemia    Anxiety    Arthritis    Left Foot   Back pain    Chronic fatigue syndrome    Gallbladder problem    High cholesterol    Hyperlipidemia    Hypertension    Hypothyroidism    IBS (irritable bowel syndrome)    Insulin resistance    Joint pain    Low vitamin D level    Obesity (BMI 30-39.9)    Palpitations    PCOS (polycystic ovarian syndrome)    PVC's (premature ventricular contractions)    Vitamin D deficiency     Past Surgical History:  Procedure Laterality Date   CHOLECYSTECTOMY     DILATATION & CURETTAGE/HYSTEROSCOPY WITH MYOSURE N/A 02/21/2021   Procedure: DILATATION & CURETTAGE/HYSTEROSCOPY WITH MYOSURE;  Surgeon: Megan Salon, MD;  Location: [place];  Service: Gynecology;  Laterality: N/A;    There were no vitals filed for this visit.    Subjective Assessment - 06/22/21 1323     Subjective Last week pt was working from home sitting in an kitchen chair which caused increased low back pain. When she got up out of chair she had cramping and extreme pain which has been ongoing since last week. Pain is constent with nothing increasing pain, some relief with meds.  Pt has history of SIJ injury 20 years ago and then again 10 years ago which  caused increased pain for weeks    Pertinent History hysterectomy    Limitations Walking;Standing;Sitting    How long can you sit comfortably? 15 minutes    How long can you stand comfortably? 10 minutes    How long can you walk comfortably? 10 minutes    Diagnostic tests L4-5 disc bulge    Patient Stated Goals decrease pain    Currently in Pain? Yes    Pain Score 6     Pain Location Back    Pain Orientation Left;Right;Lower    Pain Descriptors / Indicators Aching    Pain Type Chronic pain    Pain Radiating Towards Rt LE    Pain Onset More than a month ago    Pain Frequency Constant    Aggravating Factors  bend, stand, walk, sit    Pain Relieving Factors meds                [facility] PT Assessment - 06/22/21 0001       Assessment   Medical Diagnosis Rt side LBP with sciatica    Referring Provider (PT) Iran Planas    Onset Date/Surgical Date 06/15/21    Next MD Visit seeing Dr.T 11/16      Precautions  Precautions None      Restrictions   Weight Bearing Restrictions No      Balance Screen   Has the patient fallen in the past 6 months No      Prior Function   Level of Independence Independent      Observation/Other Assessments   Focus on Therapeutic Outcomes (FOTO)  not assessed      Posture/Postural Control   Posture Comments wide BOS,forward flexed in standing      ROM / Strength   AROM / PROM / Strength AROM;Strength      AROM   AROM Assessment Site Lumbar    Lumbar Flexion limited 50%- pain    Lumbar Extension to neutral    Lumbar - Right Side Bend limited 50% - pain    Lumbar - Left Side Bend WFL    Lumbar - Right Rotation WFL    Lumbar - Left Rotation [facility]      Strength   Strength Assessment Site Hip    Right/Left Hip Right;Left    Right Hip Flexion 3/5    Right Hip Extension 3/5    Right Hip ABduction 3/5    Left Hip Flexion 3/5    Left Hip Extension 3/5    Left Hip ABduction 3/5      Flexibility   Soft Tissue Assessment /Muscle Length  yes    Hamstrings decreased bilat    Quadriceps WFL      Palpation   SI assessment  hypomobile PAs    Palpation comment no TTP lumbar paraspinals or piriformis bilat      Special Tests   Other special tests SLR + bilat      Ambulation/Gait   Gait Comments wide BOS, decreased cadence                        Objective measurements completed on examination: See above findings.       [facility] Adult PT Treatment/Exercise - 06/22/21 0001       Exercises   Exercises Lumbar      Lumbar Exercises: Stretches   Lower Trunk Rotation Limitations x 10 bilat in pain free range    Other Lumbar Stretch Exercise sciatic nerve glide 90/90 x 5 bilat      Lumbar Exercises: Supine   Ab Set 5 reps;5 seconds    AB Set Limitations then ab set with bent knee fall out x 2      Modalities   Modalities Electrical Stimulation;Moist Heat      Moist Heat Therapy   Number Minutes Moist Heat 10 Minutes    Moist Heat Location Lumbar Spine      Electrical Stimulation   Electrical Stimulation Location lumbar    Electrical Stimulation Action TENS    Electrical Stimulation Parameters to tolerance    Electrical Stimulation Goals Pain                     PT Education - 06/22/21 1348     Education Details PT POC and goals, HEP, TENS    Person(s) Educated Patient    Methods Explanation;Demonstration;Handout    Comprehension Returned demonstration;Verbalized understanding                 PT Long Term Goals - 06/22/21 1446       PT LONG TERM GOAL #1   Title Pt will be independent in HEP    Time 6    Period Weeks  Status New    Target Date 08/03/21      PT LONG TERM GOAL #2   Title Pt will improve bilat LE strength to 4/5 to improve standing and walking tolerance    Time 6    Period Weeks    Status New    Target Date 08/03/21      PT LONG TERM GOAL #3   Title Pt will tolerate sitting x 20 minutes with pain <= 2/10    Time 6    Period Weeks    Status New     Target Date 08/03/21                    Plan - 06/22/21 1412     Clinical Impression Statement Pt is a 50 y/o female referred for low back pain with sciatica. Pt presents with decreased ROM, impaired activity tolerance and posture, decreased strength and increased pain. Pt will benefit from skilled PT to address deficits and improve functional mobility    Personal Factors and Comorbidities Past/Current Experience;Time since onset of injury/illness/exacerbation    Examination-Activity Limitations Stand;Bend;Locomotion Level;Sit    Examination-Participation Restrictions Driving;Yard Work;Community Activity;Meal Prep;Cleaning    Stability/Clinical Decision Making Stable/Uncomplicated    Clinical Decision Making Low    Rehab Potential Good    PT Frequency 2x / week    PT Duration 6 weeks    PT Treatment/Interventions Aquatic Therapy;Cryotherapy;Moist Heat;Iontophoresis 4mg /ml Dexamethasone;Electrical Stimulation;Traction;Neuromuscular re-education;Balance training;Therapeutic exercise;Therapeutic activities;Gait training;Manual techniques;Patient/family education;Taping;Dry needling    PT Next Visit Plan assess HEP, core strength and flexibility    PT Home Exercise Plan [email or web address]    Consulted and Agree with Plan of Care Patient             Patient will benefit from skilled therapeutic intervention in order to improve the following deficits and impairments:  Pain, Decreased strength, Decreased activity tolerance, Decreased range of motion, Hypomobility, Difficulty walking, Decreased balance  Visit Diagnosis: Bilateral low back pain with left-sided sciatica, unspecified chronicity - Plan: PT plan of care cert/re-cert  Bilateral low back pain with right-sided sciatica, unspecified chronicity - Plan: PT plan of care cert/re-cert  Muscle weakness (generalized) - Plan: PT plan of care cert/re-cert  Other symptoms and signs involving the musculoskeletal system - Plan: PT plan of  care cert/re-cert     Problem List Patient Active Problem List   Diagnosis Date Noted   Protrusion of lumbar intervertebral disc 06/20/2021   Acute right-sided low back pain with right-sided sciatica 06/19/2021   Bilateral hearing loss 03/30/2021   Tinnitus of both ears 03/30/2021   Diarrhea 03/14/2021   Left lower quadrant abdominal pain 03/14/2021   Uterine fibroid 01/02/2021   At risk for activity intolerance 01/01/2021   Class 1 obesity due to excess calories without serious comorbidity with body mass index (BMI) of 32.0 to 32.9 in adult 12/27/2020   Acne vulgaris 62/56/3893   Nonalcoholic hepatosteatosis 73/42/8768   Metabolic syndrome 11/57/2620   At risk for side effect of medication 12/13/2020   COVID-19 virus infection 11/02/2019   Left foot pain 09/29/2019   Depressed mood 10/27/2018   Class 1 obesity with serious comorbidity and body mass index (BMI) of 34.0 to 34.9 in adult 10/27/2018   Insulin resistance 06/21/2018   Prediabetes 02/13/2018   Colon cancer high risk 02/13/2018   Vitamin D deficiency 09/14/2017   Dyslipidemia (high LDL; low HDL) 01/13/2017   Drug-induced constipation 11/18/2016   Obesity (BMI 35.0-39.9 without comorbidity) 09/19/2016   Chronic  fatigue syndrome 09/17/2016   Mass in neck 05/21/2016   Uveitis, intermediate 02/29/2016   Right thyroid nodule 01/29/2016   Family history of breast cancer 12/12/2015   Family history of melanoma 12/12/2015   OSA (obstructive sleep apnea) 04/03/2015   Other fatigue 09/19/2014   Dry skin 09/19/2014   PVC's (premature ventricular contractions) 05/31/2014   Hyperlipidemia 01/31/2014   Sinus tachycardia 01/26/2014   Breast mass, right 01/19/2014   PALPITATIONS 04/26/2008   Bilateral lower abdominal cramping 11/04/2007   LUMBAR STRAIN 06/10/2007   Anxiety state 06/08/2007   Essential hypertension 06/08/2007    Bruin Bolger, PT 06/22/2021, 3:54 PM  [facility] [place], [place], [address] Phone: [phone]   Fax:  [phone]  Name: [name] MRN: [id number] Date of Birth: [birth year]

## 2021-06-22 NOTE — Patient Instructions (Signed)
Access Code: [email or web address] URL: [email or web address] Date: 06/22/2021 Prepared by: Isabelle Course  Exercises Supine Lower Trunk Rotation - 1 x daily - 7 x weekly - 3 sets - 10 reps Supine 90/90 Sciatic Nerve Glide with Knee Flexion/Extension - 1 x daily - 7 x weekly - 2 sets - 10 reps Supine Transversus Abdominis Bracing - Hands on Stomach - 1 x daily - 7 x weekly - 1 sets - 10 reps - 3-5 seconds hold Bent Knee Fallouts - 1 x daily - 7 x weekly - 1 sets - 10 reps

## 2021-06-25 ENCOUNTER — Encounter: Payer: No Typology Code available for payment source | Admitting: Physical Therapy

## 2021-06-26 ENCOUNTER — Other Ambulatory Visit: Payer: Self-pay | Admitting: Physician Assistant

## 2021-06-26 DIAGNOSIS — R7303 Prediabetes: Secondary | ICD-10-CM

## 2021-06-26 DIAGNOSIS — K760 Fatty (change of) liver, not elsewhere classified: Secondary | ICD-10-CM

## 2021-06-27 ENCOUNTER — Ambulatory Visit (INDEPENDENT_AMBULATORY_CARE_PROVIDER_SITE_OTHER): Payer: No Typology Code available for payment source | Admitting: Sports Medicine

## 2021-06-27 ENCOUNTER — Ambulatory Visit (INDEPENDENT_AMBULATORY_CARE_PROVIDER_SITE_OTHER): Payer: No Typology Code available for payment source | Admitting: Physical Therapy

## 2021-06-27 ENCOUNTER — Other Ambulatory Visit: Payer: Self-pay

## 2021-06-27 ENCOUNTER — Encounter: Payer: Self-pay | Admitting: Sports Medicine

## 2021-06-27 DIAGNOSIS — M6281 Muscle weakness (generalized): Secondary | ICD-10-CM | POA: Diagnosis not present

## 2021-06-27 DIAGNOSIS — R29898 Other symptoms and signs involving the musculoskeletal system: Secondary | ICD-10-CM

## 2021-06-27 DIAGNOSIS — M5126 Other intervertebral disc displacement, lumbar region: Secondary | ICD-10-CM

## 2021-06-27 DIAGNOSIS — M5442 Lumbago with sciatica, left side: Secondary | ICD-10-CM | POA: Diagnosis not present

## 2021-06-27 DIAGNOSIS — M5441 Lumbago with sciatica, right side: Secondary | ICD-10-CM | POA: Diagnosis not present

## 2021-06-27 MED ORDER — CYCLOBENZAPRINE HCL 10 MG PO TABS: 10.0000 mg | ORAL_TABLET | Freq: Three times a day (TID) | ORAL | 0 refills | Status: DC | PRN

## 2021-06-27 NOTE — Assessment & Plan Note (Signed)
This is a pleasant 50 year old female, she is a long history of on and off axial low back pain worse with sitting, flexion for Valsalva, radiation to the anterior thighs bilaterally, ultimately she did have an MRI performed that showed L4-L5 DDD with bilateral lateral recess stenosis. She has started physical therapy, has not yet done the prednisone so she will start this, we also discussed the evolutionary anthropology of disc disease, so she understands the pathophysiology. I would recommend all of the above particularly physical therapy for 6 to 8 weeks, she can follow-up with me after 6 to 8 weeks at which point we will consider epidural injection if insufficiently better.

## 2021-06-27 NOTE — Therapy (Signed)
[place], [place], [address] Phone: [phone]   Fax:  [phone]  Physical Therapy Treatment  Patient Details  Name: [name] MRN: [id number] Date of Birth: 11-05-1970 Referring Provider (PT): Iran Planas   Encounter Date: 06/27/2021   PT End of Session - 06/27/21 0925     Visit Number 2    Number of Visits 12    Date for PT Re-Evaluation 08/03/21    PT Start Time 0845    PT Stop Time 0935    PT Time Calculation (min) 50 min    Activity Tolerance Patient tolerated treatment well    Behavior During Therapy [facility] for tasks assessed/performed             Past Medical History:  Diagnosis Date   Anemia    Anxiety    Arthritis    Left Foot   Back pain    Chronic fatigue syndrome    Gallbladder problem    High cholesterol    Hyperlipidemia    Hypertension    Hypothyroidism    IBS (irritable bowel syndrome)    Insulin resistance    Joint pain    Low vitamin D level    Obesity (BMI 30-39.9)    Palpitations    PCOS (polycystic ovarian syndrome)    PVC's (premature ventricular contractions)    Vitamin D deficiency     Past Surgical History:  Procedure Laterality Date   CHOLECYSTECTOMY     DILATATION & CURETTAGE/HYSTEROSCOPY WITH MYOSURE N/A 02/21/2021   Procedure: DILATATION & CURETTAGE/HYSTEROSCOPY WITH MYOSURE;  Surgeon: Megan Salon, MD;  Location: [facility];  Service: Gynecology;  Laterality: N/A;    There were no vitals filed for this visit.   Subjective Assessment - 06/27/21 0850     Subjective Pt states "today is a good day. Yesterday was a horrible day".  She states she does not know why pain fluctuates the way it does    Patient Stated Goals decrease pain    Currently in Pain? Yes    Pain Score 2     Pain Location Back    Pain Orientation Lower;Left    Pain Descriptors / Indicators Aching;Sore                                [facility] Adult PT Treatment/Exercise - 06/27/21 0001       Lumbar Exercises: Stretches   Lower Trunk Rotation 60 seconds    Lower Trunk Rotation Limitations pain free range    Other Lumbar Stretch Exercise sciatic nerve glide 90/90 x 10      Lumbar Exercises: Aerobic   Nustep L5 x 5 min for warm up      Lumbar Exercises: Supine   Ab Set 10 reps;5 seconds    AB Set Limitations then ab set with bent knee fall out x 2    Bridge 10 reps      Lumbar Exercises: Sidelying   Clam Right;Left;20 reps      Moist Heat Therapy   Number Minutes Moist Heat 10 Minutes    Moist Heat Location Lumbar Spine      Electrical Stimulation   Electrical Stimulation Location lumbar    Electrical Stimulation Action TENS    Electrical Stimulation Parameters to tolerance    Electrical Stimulation Goals Pain      Manual Therapy   Manual Therapy Soft  tissue mobilization    Soft tissue mobilization STM lumbar paraspinals, piriformis bilat                          PT Long Term Goals - 06/22/21 1446       PT LONG TERM GOAL #1   Title Pt will be independent in HEP    Time 6    Period Weeks    Status New    Target Date 08/03/21      PT LONG TERM GOAL #2   Title Pt will improve bilat LE strength to 4/5 to improve standing and walking tolerance    Time 6    Period Weeks    Status New    Target Date 08/03/21      PT LONG TERM GOAL #3   Title Pt will tolerate sitting x 20 minutes with pain <= 2/10    Time 6    Period Weeks    Status New    Target Date 08/03/21                   Plan - 06/27/21 0925     Clinical Impression Statement Pt able to tolerate progression of exercises this session due to decreased pain today.  Able to tolerate bridges and manual work with no increase in pain    PT Next Visit Plan update HEP, progress core strength and flexibility    PT Home Exercise Plan [email or web address]    Consulted and Agree with Plan of Care  Patient             Patient will benefit from skilled therapeutic intervention in order to improve the following deficits and impairments:     Visit Diagnosis: Bilateral low back pain with left-sided sciatica, unspecified chronicity  Bilateral low back pain with right-sided sciatica, unspecified chronicity  Muscle weakness (generalized)  Other symptoms and signs involving the musculoskeletal system     Problem List Patient Active Problem List   Diagnosis Date Noted   Protrusion of lumbar intervertebral disc 06/20/2021   Acute right-sided low back pain with right-sided sciatica 06/19/2021   Bilateral hearing loss 03/30/2021   Tinnitus of both ears 03/30/2021   Diarrhea 03/14/2021   Left lower quadrant abdominal pain 03/14/2021   Uterine fibroid 01/02/2021   At risk for activity intolerance 01/01/2021   Class 1 obesity due to excess calories without serious comorbidity with body mass index (BMI) of 32.0 to 32.9 in adult 12/27/2020   Acne vulgaris 94/80/1655   Nonalcoholic hepatosteatosis 37/48/2707   Metabolic syndrome 86/75/4492   At risk for side effect of medication 12/13/2020   COVID-19 virus infection 11/02/2019   Left foot pain 09/29/2019   Depressed mood 10/27/2018   Class 1 obesity with serious comorbidity and body mass index (BMI) of 34.0 to 34.9 in adult 10/27/2018   Insulin resistance 06/21/2018   Prediabetes 02/13/2018   Colon cancer high risk 02/13/2018   Vitamin D deficiency 09/14/2017   Dyslipidemia (high LDL; low HDL) 01/13/2017   Drug-induced constipation 11/18/2016   Obesity (BMI 35.0-39.9 without comorbidity) 09/19/2016   Chronic fatigue syndrome 09/17/2016   Mass in neck 05/21/2016   Uveitis, intermediate 02/29/2016   Right thyroid nodule 01/29/2016   Family history of breast cancer 12/12/2015   Family history of melanoma 12/12/2015   OSA (obstructive sleep apnea) 04/03/2015   Other fatigue 09/19/2014   Dry skin 09/19/2014   PVC's (premature  ventricular contractions) 05/31/2014  Hyperlipidemia 01/31/2014   Sinus tachycardia 01/26/2014   Breast mass, right 01/19/2014   PALPITATIONS 04/26/2008   Bilateral lower abdominal cramping 11/04/2007   LUMBAR STRAIN 06/10/2007   Anxiety state 06/08/2007   Essential hypertension 06/08/2007    Yisrael Obryan, PT 06/27/2021, 9:27 AM  [facility] [place], [place], [address] Phone: [phone]   Fax:  [phone]  Name: [name] MRN: [id number] Date of Birth: September 11, 1970

## 2021-06-27 NOTE — Progress Notes (Signed)
    Procedures performed today:    None.  Independent interpretation of notes and tests performed by another provider:   I did personally review an abdominal and pelvic CT, there is a clear L4-L5 herniated disc.  Brief History, Exam, Impression, and Recommendations:    Protrusion of lumbar intervertebral disc This is a pleasant 50 year old female, she is a long history of on and off axial low back pain worse with sitting, flexion for Valsalva, radiation to the anterior thighs bilaterally, ultimately she did have an MRI performed that showed L4-L5 DDD with bilateral lateral recess stenosis. She has started physical therapy, has not yet done the prednisone so she will start this, we also discussed the evolutionary anthropology of disc disease, so she understands the pathophysiology. I would recommend all of the above particularly physical therapy for 6 to 8 weeks, she can follow-up with me after 6 to 8 weeks at which point we will consider epidural injection if insufficiently better.    ___________________________________________ [name]. Dianah Field, M.D., ABFM., CAQSM. Primary Care and National Instructor of [place] [facility]

## 2021-07-02 ENCOUNTER — Other Ambulatory Visit: Payer: Self-pay

## 2021-07-02 ENCOUNTER — Ambulatory Visit (INDEPENDENT_AMBULATORY_CARE_PROVIDER_SITE_OTHER): Payer: No Typology Code available for payment source | Admitting: Physical Therapy

## 2021-07-02 DIAGNOSIS — M5442 Lumbago with sciatica, left side: Secondary | ICD-10-CM

## 2021-07-02 DIAGNOSIS — M6281 Muscle weakness (generalized): Secondary | ICD-10-CM

## 2021-07-02 DIAGNOSIS — R29898 Other symptoms and signs involving the musculoskeletal system: Secondary | ICD-10-CM

## 2021-07-02 DIAGNOSIS — M5441 Lumbago with sciatica, right side: Secondary | ICD-10-CM | POA: Diagnosis not present

## 2021-07-02 NOTE — Therapy (Addendum)
[facility] [place], [place], [address] Phone: [phone]   Fax:  [phone]  Physical Therapy Treatment and Discharge  Patient Details  Name: [name] MRN: [id number] Date of Birth: [birth year] Referring Provider (PT): Iran Planas   Encounter Date: 07/02/2021   PT End of Session - 07/02/21 1144     Visit Number 3    Number of Visits 12    Date for PT Re-Evaluation 08/03/21    PT Start Time 1100    PT Stop Time 1150    PT Time Calculation (min) 50 min    Activity Tolerance Patient tolerated treatment well    Behavior During Therapy [facility] for tasks assessed/performed             Past Medical History:  Diagnosis Date   Anemia    Anxiety    Arthritis    Left Foot   Back pain    Chronic fatigue syndrome    Gallbladder problem    High cholesterol    Hyperlipidemia    Hypertension    Hypothyroidism    IBS (irritable bowel syndrome)    Insulin resistance    Joint pain    Low vitamin D level    Obesity (BMI 30-39.9)    Palpitations    PCOS (polycystic ovarian syndrome)    PVC's (premature ventricular contractions)    Vitamin D deficiency     Past Surgical History:  Procedure Laterality Date   CHOLECYSTECTOMY     DILATATION & CURETTAGE/HYSTEROSCOPY WITH MYOSURE N/A 02/21/2021   Procedure: DILATATION & CURETTAGE/HYSTEROSCOPY WITH MYOSURE;  Surgeon: Megan Salon, MD;  Location: [facility];  Service: Gynecology;  Laterality: N/A;    There were no vitals filed for this visit.   Subjective Assessment - 07/02/21 1106     Subjective Pt states she had a lot of pain yesterday after doing a lot of errands    Patient Stated Goals decrease pain    Currently in Pain? Yes    Pain Score 2     Pain Location Back    Pain Orientation Lower    Pain Descriptors / Indicators Aching;Sore                [facility] PT Assessment - 07/02/21 0001       Assessment   Medical  Diagnosis Rt side LBP with sciatica    Referring Provider (PT) Iran Planas    Onset Date/Surgical Date 06/15/21    Next MD Visit January 2023      Precautions   Precautions None      AROM   Lumbar Flexion limited 25%    Lumbar Extension to neutral    Lumbar - Right Side Bend limited 25%      Special Tests   Other special tests SLR (-) bilat                           [facility] Adult PT Treatment/Exercise - 07/02/21 0001       Lumbar Exercises: Stretches   Lower Trunk Rotation Limitations x 10 pain free range    Other Lumbar Stretch Exercise sciataic nerve glide with knee straight and ankle pump x 20 bilat      Lumbar Exercises: Aerobic   Recumbent Bike L3 x 5 min for warm up      Lumbar Exercises: Standing   Shoulder Extension 20 reps    Theraband Level (  Shoulder Extension) Level 2 (Red)    Other Standing Lumbar Exercises pallof press red TB x 12 bilat      Lumbar Exercises: Seated   Other Seated Lumbar Exercises seated on physioball lateral wt shifts, CW, pain with CCW - all x 20      Lumbar Exercises: Supine   Ab Set 10 reps;5 seconds    AB Set Limitations then ab set with bent knee fall out x 2    Bent Knee Raise 10 reps    Bridge 20 reps      Lumbar Exercises: Sidelying   Clam Right;Left;20 reps      Moist Heat Therapy   Number Minutes Moist Heat 10 Minutes    Moist Heat Location Lumbar Spine      Electrical Stimulation   Electrical Stimulation Location lumbar    Electrical Stimulation Action TENS    Electrical Stimulation Parameters to tolerance    Electrical Stimulation Goals Pain      Manual Therapy   Manual Therapy Joint mobilization    Joint Mobilization grade 2-3 SIJ mobs to improve mobility    Soft tissue mobilization STM lumbar paraspinals, piriformis bilat                          PT Long Term Goals - 06/22/21 1446       PT LONG TERM GOAL #1   Title Pt will be independent in HEP    Time 6    Period Weeks     Status New    Target Date 08/03/21      PT LONG TERM GOAL #2   Title Pt will improve bilat LE strength to 4/5 to improve standing and walking tolerance    Time 6    Period Weeks    Status New    Target Date 08/03/21      PT LONG TERM GOAL #3   Title Pt will tolerate sitting x 20 minutes with pain <= 2/10    Time 6    Period Weeks    Status New    Target Date 08/03/21                   Plan - 07/02/21 1144     Clinical Impression Statement Pt with good tolerance to sitting on physioball, discussed trying to sit on ball at home while working to help reduce pain. Pt with good tolerance to addition of core stabilization in standing    PT Next Visit Plan update HEP, core strength and stability    PT Home Exercise Plan [id number]    Consulted and Agree with Plan of Care Patient             Patient will benefit from skilled therapeutic intervention in order to improve the following deficits and impairments:     Visit Diagnosis: Bilateral low back pain with left-sided sciatica, unspecified chronicity  Bilateral low back pain with right-sided sciatica, unspecified chronicity  Muscle weakness (generalized)  Other symptoms and signs involving the musculoskeletal system     Problem List Patient Active Problem List   Diagnosis Date Noted   Protrusion of lumbar intervertebral disc 06/20/2021   Acute right-sided low back pain with right-sided sciatica 06/19/2021   Bilateral hearing loss 03/30/2021   Tinnitus of both ears 03/30/2021   Diarrhea 03/14/2021   Left lower quadrant abdominal pain 03/14/2021   Uterine fibroid 01/02/2021   At risk for activity intolerance 01/01/2021  Class 1 obesity due to excess calories without serious comorbidity with body mass index (BMI) of 32.0 to 32.9 in adult 12/27/2020   Acne vulgaris 34/91/7915   Nonalcoholic hepatosteatosis 05/69/7948   Metabolic syndrome 01/65/5374   At risk for side effect of medication 12/13/2020    COVID-19 virus infection 11/02/2019   Left foot pain 09/29/2019   Depressed mood 10/27/2018   Class 1 obesity with serious comorbidity and body mass index (BMI) of 34.0 to 34.9 in adult 10/27/2018   Insulin resistance 06/21/2018   Prediabetes 02/13/2018   Colon cancer high risk 02/13/2018   Vitamin D deficiency 09/14/2017   Dyslipidemia (high LDL; low HDL) 01/13/2017   Drug-induced constipation 11/18/2016   Obesity (BMI 35.0-39.9 without comorbidity) 09/19/2016   Chronic fatigue syndrome 09/17/2016   Mass in neck 05/21/2016   Uveitis, intermediate 02/29/2016   Right thyroid nodule 01/29/2016   Family history of breast cancer 12/12/2015   Family history of melanoma 12/12/2015   OSA (obstructive sleep apnea) 04/03/2015   Other fatigue 09/19/2014   Dry skin 09/19/2014   PVC's (premature ventricular contractions) 05/31/2014   Hyperlipidemia 01/31/2014   Sinus tachycardia 01/26/2014   Breast mass, right 01/19/2014   PALPITATIONS 04/26/2008   Bilateral lower abdominal cramping 11/04/2007   LUMBAR STRAIN 06/10/2007   Anxiety state 06/08/2007   Essential hypertension 06/08/2007   PHYSICAL THERAPY DISCHARGE SUMMARY  Visits from Start of Care: 3  Current functional level related to goals / functional outcomes: Improved mobility   Remaining deficits: See above   Education / Equipment: HEP   Patient agrees to discharge. Patient goals were not met. Patient is being discharged due to not returning since the last visit.  Isabelle Course, PT,DPT01/06/2309:08 AM   Isabelle Course, PT 07/02/2021, 11:47 AM  [facility] [address] [place], [place], [address] Phone: [phone]   Fax:  [phone]  Name: [name] MRN: [id number] Date of Birth: 01/15/1971

## 2021-07-03 ENCOUNTER — Other Ambulatory Visit: Payer: Self-pay | Admitting: *Deleted

## 2021-07-03 MED ORDER — TRETINOIN 0.1 % EX CREA: TOPICAL_CREAM | Freq: Every day | CUTANEOUS | 1 refills | Status: DC

## 2021-07-04 ENCOUNTER — Encounter: Payer: No Typology Code available for payment source | Admitting: Physical Therapy

## 2021-07-11 ENCOUNTER — Encounter: Payer: No Typology Code available for payment source | Admitting: Physical Therapy

## 2021-07-13 ENCOUNTER — Encounter: Payer: No Typology Code available for payment source | Admitting: Physical Therapy

## 2021-07-14 ENCOUNTER — Encounter: Payer: Self-pay | Admitting: Physician Assistant

## 2021-07-16 MED ORDER — TIRZEPATIDE 10 MG/0.5ML ~~LOC~~ SOAJ: 10.0000 mg | SUBCUTANEOUS | 0 refills | Status: DC

## 2021-08-03 ENCOUNTER — Other Ambulatory Visit (HOSPITAL_BASED_OUTPATIENT_CLINIC_OR_DEPARTMENT_OTHER): Payer: Self-pay | Admitting: Obstetrics & Gynecology

## 2021-08-03 DIAGNOSIS — Z9189 Other specified personal risk factors, not elsewhere classified: Secondary | ICD-10-CM

## 2021-08-10 MED ORDER — MOUNJARO 12.5 MG/0.5ML ~~LOC~~ SOAJ: 12.5000 mg | SUBCUTANEOUS | 0 refills | Status: DC

## 2021-08-10 NOTE — Addendum Note (Signed)
Addended by: Donella Stade on: 08/10/2021 04:26 PM   Modules accepted: Orders

## 2021-08-22 ENCOUNTER — Ambulatory Visit (HOSPITAL_COMMUNITY)
Admission: RE | Admit: 2021-08-22 | Discharge: 2021-08-22 | Disposition: A | Discharge status: Home / Self Care | Payer: No Typology Code available for payment source | Source: Ambulatory Visit | Attending: Obstetrics & Gynecology | Admitting: Obstetrics & Gynecology

## 2021-08-22 ENCOUNTER — Other Ambulatory Visit: Payer: Self-pay

## 2021-08-22 DIAGNOSIS — Z9189 Other specified personal risk factors, not elsewhere classified: Secondary | ICD-10-CM | POA: Diagnosis not present

## 2021-08-22 MED ORDER — GADOBUTROL 1 MMOL/ML IV SOLN
9.0000 mL | Freq: Once | INTRAVENOUS | Status: AC | PRN
  Administered 2021-08-22: 9 mL via INTRAVENOUS

## 2021-09-01 ENCOUNTER — Other Ambulatory Visit: Payer: Self-pay | Admitting: Physician Assistant

## 2021-09-01 DIAGNOSIS — F411 Generalized anxiety disorder: Secondary | ICD-10-CM

## 2021-09-03 NOTE — Telephone Encounter (Signed)
Last appt 06/19/2021 (acute) 03/30/2021 (normal visit) Last written 04/30/2021 #60 with no refills, please advise.

## 2021-09-11 ENCOUNTER — Other Ambulatory Visit: Payer: Self-pay

## 2021-09-11 ENCOUNTER — Encounter: Payer: Self-pay | Admitting: Physician Assistant

## 2021-09-11 ENCOUNTER — Ambulatory Visit: Payer: No Typology Code available for payment source | Admitting: Physician Assistant

## 2021-09-11 DIAGNOSIS — Z01818 Encounter for other preprocedural examination: Secondary | ICD-10-CM | POA: Diagnosis not present

## 2021-09-11 DIAGNOSIS — R7303 Prediabetes: Secondary | ICD-10-CM

## 2021-09-11 DIAGNOSIS — Z6833 Body mass index (BMI) 33.0-33.9, adult: Secondary | ICD-10-CM

## 2021-09-11 DIAGNOSIS — G4733 Obstructive sleep apnea (adult) (pediatric): Secondary | ICD-10-CM

## 2021-09-11 DIAGNOSIS — K760 Fatty (change of) liver, not elsewhere classified: Secondary | ICD-10-CM | POA: Diagnosis not present

## 2021-09-11 DIAGNOSIS — E6609 Other obesity due to excess calories: Secondary | ICD-10-CM

## 2021-09-11 DIAGNOSIS — E785 Hyperlipidemia, unspecified: Secondary | ICD-10-CM

## 2021-09-11 DIAGNOSIS — I10 Essential (primary) hypertension: Secondary | ICD-10-CM

## 2021-09-11 MED ORDER — TIRZEPATIDE 7.5 MG/0.5ML ~~LOC~~ SOAJ
7.5000 mg | SUBCUTANEOUS | 0 refills | Status: DC
Start: 2021-09-11 — End: 2022-01-24

## 2021-09-11 MED ORDER — MOUNJARO 10 MG/0.5ML ~~LOC~~ SOAJ: 10.0000 mg | SUBCUTANEOUS | 0 refills | Status: DC

## 2021-09-11 MED ORDER — MOUNJARO 12.5 MG/0.5ML ~~LOC~~ SOAJ: 12.5000 mg | SUBCUTANEOUS | 0 refills | Status: DC

## 2021-09-11 NOTE — Progress Notes (Signed)
Subjective:    Patient ID: [name], female    DOB: 24-May-1971, 51 y.o.   MRN: [id number]  HPI Pt is a 51 yo female who presents to the clinic for pre-surgical clearance.   Pt is to have elective plastic surgery on feb 14th for liposuction, abdominoplasty, and breast reduction. She has lost 31lbs on mounjaro with diet and exercise. She would like to continue with mounjaro. She is tolerating medication well. She is going to have to stop for 2 weeks prior to surgery and worried about going back on the 12.5mg . no concerns or complaints.   Pt has had labs drawn today for plastic surgeon.    .. Active Ambulatory Problems    Diagnosis Date Noted   Anxiety state 06/08/2007   Essential hypertension 06/08/2007   PALPITATIONS 04/26/2008   Bilateral lower abdominal cramping 11/04/2007   LUMBAR STRAIN 06/10/2007   Breast mass, right 01/19/2014   Sinus tachycardia 01/26/2014   Hyperlipidemia 01/31/2014   PVC's (premature ventricular contractions) 05/31/2014   Other fatigue 09/19/2014   Dry skin 09/19/2014   OSA (obstructive sleep apnea) 04/03/2015   Family history of breast cancer 12/12/2015   Family history of melanoma 12/12/2015   Right thyroid nodule 01/29/2016   Uveitis, intermediate 02/29/2016   Mass in neck 05/21/2016   Chronic fatigue syndrome 09/17/2016   Obesity (BMI 35.0-39.9 without comorbidity) 09/19/2016   Drug-induced constipation 11/18/2016   Dyslipidemia (high LDL; low HDL) 01/13/2017   Vitamin D deficiency 09/14/2017   Prediabetes 02/13/2018   Colon cancer high risk 02/13/2018   Insulin resistance 06/21/2018   Depressed mood 10/27/2018   Left foot pain 09/29/2019   COVID-19 virus infection 33/29/5188   Nonalcoholic hepatosteatosis 41/66/0630   Metabolic syndrome 16/08/930   At risk for side effect of medication 12/13/2020   Class 1 obesity due to excess calories without serious comorbidity with body mass index (BMI) of 33.0 to 33.9 in adult 12/27/2020    Acne vulgaris 12/27/2020   At risk for activity intolerance 01/01/2021   Uterine fibroid 01/02/2021   Diarrhea 03/14/2021   Left lower quadrant abdominal pain 03/14/2021   Bilateral hearing loss 03/30/2021   Tinnitus of both ears 03/30/2021   Acute right-sided low back pain with right-sided sciatica 06/19/2021   Protrusion of lumbar intervertebral disc 06/20/2021   Resolved Ambulatory Problems    Diagnosis Date Noted   Weight gain 09/19/2014   DUB (dysfunctional uterine bleeding) 06/28/2015   Redness of left eye 12/12/2015   Pelvic pain 02/13/2018   Class 1 obesity with serious comorbidity and body mass index (BMI) of 34.0 to 34.9 in adult 10/27/2018   Menorrhagia with irregular cycle 12/26/2020   Thickened endometrium 01/02/2021   Irregular bleeding    Past Medical History:  Diagnosis Date   Anemia    Anxiety    Arthritis    Back pain    Gallbladder problem    High cholesterol    Hypertension    Hypothyroidism    IBS (irritable bowel syndrome)    Joint pain    Low vitamin D level    Obesity (BMI 30-39.9)    PCOS (polycystic ovarian syndrome)       Review of Systems  All other systems reviewed and are negative.     Objective:   Physical Exam Vitals reviewed.  Constitutional:      Appearance: Normal appearance. She is obese.  HENT:     Head: Normocephalic.  Cardiovascular:     Rate and  Rhythm: Normal rate and regular rhythm.     Pulses: Normal pulses.     Heart sounds: Normal heart sounds.  Pulmonary:     Effort: Pulmonary effort is normal.     Breath sounds: Normal breath sounds.  Musculoskeletal:     Right lower leg: No edema.     Left lower leg: No edema.  Neurological:     General: No focal deficit present.     Mental Status: She is alert and oriented to person, place, and time.  Psychiatric:        Mood and Affect: Mood normal.          Assessment & Plan:  Marland KitchenMarland KitchenJacquelin was seen today for pre-op exam.  Diagnoses and all orders for this  visit:  Preop examination -     EKG 12-Lead  Essential hypertension  OSA (obstructive sleep apnea)  Nonalcoholic hepatosteatosis  Dyslipidemia (high LDL; low HDL)  Prediabetes  Class 1 obesity due to excess calories without serious comorbidity with body mass index (BMI) of 33.0 to 33.9 in adult -     tirzepatide (MOUNJARO) 12.5 MG/0.5ML Pen; Inject 12.5 mg into the skin once a week. -     tirzepatide (MOUNJARO) 7.5 MG/0.5ML Pen; Inject 7.5 mg into the skin once a week. -     tirzepatide (MOUNJARO) 10 MG/0.5ML Pen; Inject 10 mg into the skin once a week.   Vitals look great.  No concerning physical exam findings No history of blood clots EKG NSR with no concerns Labs drawn through plastic surgery office Stop mounjaro 2 weeks before surgery Start back on 7.5mg  then titrate up again.  Pt was released for surgery pending surgeon clearance on labs.  Follow up in 3 months.

## 2021-09-13 ENCOUNTER — Encounter: Payer: Self-pay | Admitting: Physician Assistant

## 2021-10-31 ENCOUNTER — Other Ambulatory Visit: Payer: Self-pay | Admitting: Physician Assistant

## 2021-10-31 DIAGNOSIS — E6609 Other obesity due to excess calories: Secondary | ICD-10-CM

## 2021-11-02 ENCOUNTER — Encounter: Payer: Self-pay | Admitting: Neurology

## 2021-11-02 ENCOUNTER — Other Ambulatory Visit: Payer: Self-pay | Admitting: Physician Assistant

## 2021-11-02 DIAGNOSIS — E6609 Other obesity due to excess calories: Secondary | ICD-10-CM

## 2021-11-02 NOTE — Telephone Encounter (Signed)
Mychart message sent to patient.

## 2021-11-04 ENCOUNTER — Other Ambulatory Visit: Payer: Self-pay | Admitting: Physician Assistant

## 2021-11-04 DIAGNOSIS — E6609 Other obesity due to excess calories: Secondary | ICD-10-CM

## 2021-11-05 ENCOUNTER — Encounter: Payer: Self-pay | Admitting: Physician Assistant

## 2021-11-05 DIAGNOSIS — E6609 Other obesity due to excess calories: Secondary | ICD-10-CM

## 2021-11-09 ENCOUNTER — Other Ambulatory Visit: Payer: Self-pay | Admitting: Physician Assistant

## 2021-11-09 DIAGNOSIS — E6609 Other obesity due to excess calories: Secondary | ICD-10-CM

## 2021-11-12 ENCOUNTER — Other Ambulatory Visit: Payer: Self-pay | Admitting: *Deleted

## 2021-11-12 MED ORDER — MOUNJARO 12.5 MG/0.5ML ~~LOC~~ SOAJ: 12.5000 mg | SUBCUTANEOUS | 0 refills | Status: DC

## 2021-11-12 NOTE — Addendum Note (Signed)
Addended byAnnamaria Helling on: 11/12/2021 09:17 AM ? ? Modules accepted: Orders ? ?

## 2021-12-18 ENCOUNTER — Other Ambulatory Visit: Payer: Self-pay | Admitting: Physician Assistant

## 2021-12-18 DIAGNOSIS — I493 Ventricular premature depolarization: Secondary | ICD-10-CM

## 2022-01-02 ENCOUNTER — Other Ambulatory Visit: Payer: Self-pay | Admitting: Physician Assistant

## 2022-01-02 ENCOUNTER — Other Ambulatory Visit (INDEPENDENT_AMBULATORY_CARE_PROVIDER_SITE_OTHER): Payer: Self-pay | Admitting: Family Medicine

## 2022-01-02 DIAGNOSIS — F411 Generalized anxiety disorder: Secondary | ICD-10-CM

## 2022-01-02 DIAGNOSIS — R7303 Prediabetes: Secondary | ICD-10-CM

## 2022-01-02 DIAGNOSIS — K760 Fatty (change of) liver, not elsewhere classified: Secondary | ICD-10-CM

## 2022-01-02 NOTE — Telephone Encounter (Signed)
Last appt January 2023 Last written 09/03/2021 #60 with 1 refill Pended #60 with note that needs appt for refills (told to follow up in 3 months) Please advise.

## 2022-01-25 ENCOUNTER — Other Ambulatory Visit: Payer: Self-pay | Admitting: Physician Assistant

## 2022-01-25 ENCOUNTER — Ambulatory Visit: Payer: No Typology Code available for payment source | Admitting: Physician Assistant

## 2022-01-25 VITALS — BP 108/74 | HR 84 | Ht 65.0 in | Wt 186.0 lb

## 2022-01-25 DIAGNOSIS — F411 Generalized anxiety disorder: Secondary | ICD-10-CM

## 2022-01-25 DIAGNOSIS — R7303 Prediabetes: Secondary | ICD-10-CM

## 2022-01-25 DIAGNOSIS — I493 Ventricular premature depolarization: Secondary | ICD-10-CM

## 2022-01-25 DIAGNOSIS — I10 Essential (primary) hypertension: Secondary | ICD-10-CM

## 2022-01-25 DIAGNOSIS — E66811 Obesity, class 1: Secondary | ICD-10-CM

## 2022-01-25 DIAGNOSIS — E6609 Other obesity due to excess calories: Secondary | ICD-10-CM

## 2022-01-25 DIAGNOSIS — Z01818 Encounter for other preprocedural examination: Secondary | ICD-10-CM

## 2022-01-25 DIAGNOSIS — Z6830 Body mass index (BMI) 30.0-30.9, adult: Secondary | ICD-10-CM

## 2022-01-25 DIAGNOSIS — K760 Fatty (change of) liver, not elsewhere classified: Secondary | ICD-10-CM

## 2022-01-25 MED ORDER — METFORMIN HCL 500 MG PO TABS: 500.0000 mg | ORAL_TABLET | Freq: Two times a day (BID) | ORAL | 1 refills | Status: DC

## 2022-01-25 MED ORDER — SERTRALINE HCL 50 MG PO TABS: 50.0000 mg | ORAL_TABLET | Freq: Every day | ORAL | 1 refills | Status: DC

## 2022-01-25 MED ORDER — TIRZEPATIDE 15 MG/0.5ML ~~LOC~~ SOAJ: 15.0000 mg | SUBCUTANEOUS | 1 refills | Status: DC

## 2022-01-25 MED ORDER — ALPRAZOLAM 0.5 MG PO TABS: ORAL_TABLET | ORAL | 5 refills | Status: DC

## 2022-01-25 MED ORDER — NEBIVOLOL HCL 5 MG PO TABS: 5.0000 mg | ORAL_TABLET | Freq: Every day | ORAL | 1 refills | Status: DC

## 2022-01-25 NOTE — Progress Notes (Unsigned)
Established Patient Office Visit  Subjective   Patient ID: [name], female    DOB: 14-Dec-1970  Age: 51 y.o. MRN: [id number]  Chief Complaint  Patient presents with   Pre-op Exam    HPI Pt is a 51 yo obese female with HTN, PVC, OSA, non-alcholic hepatosteatosis, pre-diabetes who presents to the clinic for surgical clearance.   Pt is having cosmetic surgery on upper and lower eye-lids, neck lift and liposuction, back liposuction.   She had labs done with surgeon.    Pt denies any SOB, fever, chills, cough, headache, ST.  .. Active Ambulatory Problems    Diagnosis Date Noted   Anxiety state 06/08/2007   Essential hypertension 06/08/2007   PALPITATIONS 04/26/2008   Bilateral lower abdominal cramping 11/04/2007   LUMBAR STRAIN 06/10/2007   Breast mass, right 01/19/2014   Sinus tachycardia 01/26/2014   Hyperlipidemia 01/31/2014   PVC's (premature ventricular contractions) 05/31/2014   Other fatigue 09/19/2014   Dry skin 09/19/2014   OSA (obstructive sleep apnea) 04/03/2015   Family history of breast cancer 12/12/2015   Family history of melanoma 12/12/2015   Right thyroid nodule 01/29/2016   Uveitis, intermediate 02/29/2016   Mass in neck 05/21/2016   Chronic fatigue syndrome 09/17/2016   Obesity (BMI 35.0-39.9 without comorbidity) 09/19/2016   Drug-induced constipation 11/18/2016   Dyslipidemia (high LDL; low HDL) 01/13/2017   Vitamin D deficiency 09/14/2017   Prediabetes 02/13/2018   Colon cancer high risk 02/13/2018   Insulin resistance 06/21/2018   Depressed mood 10/27/2018   Left foot pain 09/29/2019   COVID-19 virus infection 26/94/8546   Nonalcoholic hepatosteatosis 27/10/5007   Metabolic syndrome 38/18/2993   At risk for side effect of medication 12/13/2020   Class 1 obesity due to excess calories with serious comorbidity and body mass index (BMI) of 30.0 to 30.9 in adult 12/27/2020   Acne vulgaris 12/27/2020   At risk for activity intolerance  01/01/2021   Uterine fibroid 01/02/2021   Diarrhea 03/14/2021   Left lower quadrant abdominal pain 03/14/2021   Bilateral hearing loss 03/30/2021   Tinnitus of both ears 03/30/2021   Acute right-sided low back pain with right-sided sciatica 06/19/2021   Protrusion of lumbar intervertebral disc 06/20/2021   Preoperative clearance 01/29/2022   Resolved Ambulatory Problems    Diagnosis Date Noted   Weight gain 09/19/2014   DUB (dysfunctional uterine bleeding) 06/28/2015   Redness of left eye 12/12/2015   Pelvic pain 02/13/2018   Class 1 obesity with serious comorbidity and body mass index (BMI) of 34.0 to 34.9 in adult 10/27/2018   Menorrhagia with irregular cycle 12/26/2020   Thickened endometrium 01/02/2021   Irregular bleeding    Past Medical History:  Diagnosis Date   Anemia    Anxiety    Arthritis    Back pain    Gallbladder problem    High cholesterol    Hypertension    Hypothyroidism    IBS (irritable bowel syndrome)    Joint pain    Low vitamin D level    Obesity (BMI 30-39.9)    PCOS (polycystic ovarian syndrome)      Review of Systems  All other systems reviewed and are negative.     Objective:     BP 108/74   Pulse 84   Ht '5\' 5"'$  (1.651 m)   Wt 186 lb (84.4 kg)   SpO2 98%   BMI 30.95 kg/m  BP Readings from Last 3 Encounters:  01/25/22 108/74  09/11/21 (P)  117/80  06/19/21 114/76      Physical Exam Vitals reviewed.  Constitutional:      Appearance: Normal appearance. She is obese.  HENT:     Head: Normocephalic.     Right Ear: Tympanic membrane normal.     Left Ear: Tympanic membrane normal.     Nose: Nose normal.     Mouth/Throat:     Mouth: Mucous membranes are moist.  Eyes:     Conjunctiva/sclera: Conjunctivae normal.  Neck:     Vascular: No carotid bruit.  Cardiovascular:     Rate and Rhythm: Normal rate and regular rhythm.     Pulses: Normal pulses.     Heart sounds: Normal heart sounds.  Pulmonary:     Effort: Pulmonary  effort is normal.     Breath sounds: Normal breath sounds. No wheezing or rhonchi.  Musculoskeletal:     Right lower leg: No edema.     Left lower leg: No edema.  Lymphadenopathy:     Cervical: No cervical adenopathy.  Neurological:     General: No focal deficit present.     Mental Status: She is alert and oriented to person, place, and time.  Psychiatric:        Mood and Affect: Mood normal.       EKG: NSR, no acute changes, no ST elevation or depression.       Assessment & Plan:  Marland KitchenMarland KitchenSadiya was seen today for pre-op exam.  Diagnoses and all orders for this visit:  Preop examination -     EKG 12-Lead  Essential hypertension  Anxiety state -     ALPRAZolam (XANAX) 0.5 MG tablet; TAKE 1 TABLET BY MOUTH TWICE A DAY AS NEEDED FOR ANXIETY -     sertraline (ZOLOFT) 50 MG tablet; Take 1 tablet (50 mg total) by mouth daily.  Prediabetes -     metFORMIN (GLUCOPHAGE) 500 MG tablet; Take 1 tablet (500 mg total) by mouth 2 (two) times daily with a meal. -     tirzepatide (MOUNJARO) 15 MG/0.5ML Pen; Inject 15 mg into the skin once a week.  Nonalcoholic hepatosteatosis -     metFORMIN (GLUCOPHAGE) 500 MG tablet; Take 1 tablet (500 mg total) by mouth 2 (two) times daily with a meal.  PVC's (premature ventricular contractions) -     nebivolol (BYSTOLIC) 5 MG tablet; Take 1 tablet (5 mg total) by mouth daily.  Class 1 obesity due to excess calories with serious comorbidity and body mass index (BMI) of 30.0 to 30.9 in adult -     tirzepatide [facility]) 15 MG/0.5ML Pen; Inject 15 mg into the skin once a week.   EKG done Vitals look great No concerns or PE Reviewed labs done through [facility] no concerns Platelets a little elevated(408) told patient to start ASA '81mg'$  24hrs after surgery Refilled medications Filled out surgical clearance and approved.  Increased mounjaro to '10mg'$  weekly Follow up in 3 months.    Iran Planas, PA-C

## 2022-01-29 ENCOUNTER — Encounter: Payer: Self-pay | Admitting: Physician Assistant

## 2022-01-29 DIAGNOSIS — Z01818 Encounter for other preprocedural examination: Secondary | ICD-10-CM | POA: Insufficient documentation

## 2022-03-20 ENCOUNTER — Encounter (INDEPENDENT_AMBULATORY_CARE_PROVIDER_SITE_OTHER): Payer: Self-pay

## 2022-04-22 ENCOUNTER — Encounter: Payer: Self-pay | Admitting: Physician Assistant

## 2022-04-22 DIAGNOSIS — E6609 Other obesity due to excess calories: Secondary | ICD-10-CM

## 2022-04-23 MED ORDER — MOUNJARO 10 MG/0.5ML ~~LOC~~ SOAJ: 10.0000 mg | SUBCUTANEOUS | 0 refills | Status: DC

## 2022-04-23 MED ORDER — MOUNJARO 12.5 MG/0.5ML ~~LOC~~ SOAJ: 12.5000 mg | SUBCUTANEOUS | 0 refills | Status: DC

## 2022-04-30 ENCOUNTER — Telehealth: Payer: Self-pay

## 2022-04-30 NOTE — Telephone Encounter (Signed)
Initiated Prior authorization EXH:[id number] '10MG'$ /0.5ML pen-injectors Via: Covermymeds Case/Key:[id number] Status: approved  as of 04/30/22 Reason:Coverage Start Date:03/31/2022;Coverage End Date:04/30/2023; Notified Pt via: Mychart

## 2022-05-10 ENCOUNTER — Other Ambulatory Visit: Payer: Self-pay | Admitting: Physician Assistant

## 2022-05-10 DIAGNOSIS — F411 Generalized anxiety disorder: Secondary | ICD-10-CM

## 2022-06-17 ENCOUNTER — Other Ambulatory Visit: Payer: Self-pay | Admitting: Physician Assistant

## 2022-06-17 DIAGNOSIS — I493 Ventricular premature depolarization: Secondary | ICD-10-CM

## 2022-06-29 ENCOUNTER — Other Ambulatory Visit: Payer: Self-pay | Admitting: Physician Assistant

## 2022-06-29 DIAGNOSIS — Z6833 Body mass index (BMI) 33.0-33.9, adult: Secondary | ICD-10-CM

## 2022-06-29 DIAGNOSIS — E6609 Other obesity due to excess calories: Secondary | ICD-10-CM

## 2022-06-29 DIAGNOSIS — R7303 Prediabetes: Secondary | ICD-10-CM

## 2022-07-01 NOTE — Addendum Note (Signed)
Addended byAnnamaria Helling on: 07/01/2022 02:09 PM   Modules accepted: Orders

## 2022-07-15 ENCOUNTER — Other Ambulatory Visit: Payer: Self-pay | Admitting: Neurology

## 2022-07-15 DIAGNOSIS — E6609 Other obesity due to excess calories: Secondary | ICD-10-CM

## 2022-07-15 DIAGNOSIS — R7303 Prediabetes: Secondary | ICD-10-CM

## 2022-07-15 DIAGNOSIS — F411 Generalized anxiety disorder: Secondary | ICD-10-CM

## 2022-07-15 DIAGNOSIS — K760 Fatty (change of) liver, not elsewhere classified: Secondary | ICD-10-CM

## 2022-07-15 MED ORDER — TRETINOIN 0.1 % EX CREA: TOPICAL_CREAM | CUTANEOUS | 0 refills | Status: DC

## 2022-07-15 MED ORDER — SERTRALINE HCL 50 MG PO TABS: 50.0000 mg | ORAL_TABLET | Freq: Every day | ORAL | 0 refills | Status: DC

## 2022-07-15 MED ORDER — MOUNJARO 12.5 MG/0.5ML ~~LOC~~ SOAJ: 12.5000 mg | SUBCUTANEOUS | 0 refills | Status: DC

## 2022-07-15 MED ORDER — METFORMIN HCL 500 MG PO TABS: 500.0000 mg | ORAL_TABLET | Freq: Two times a day (BID) | ORAL | 0 refills | Status: DC

## 2022-07-19 ENCOUNTER — Ambulatory Visit (INDEPENDENT_AMBULATORY_CARE_PROVIDER_SITE_OTHER): Payer: No Typology Code available for payment source

## 2022-07-19 ENCOUNTER — Telehealth (INDEPENDENT_AMBULATORY_CARE_PROVIDER_SITE_OTHER): Payer: No Typology Code available for payment source | Admitting: Family Medicine

## 2022-07-19 ENCOUNTER — Encounter: Payer: Self-pay | Admitting: Family Medicine

## 2022-07-19 VITALS — BP 107/78 | HR 75 | Ht 65.0 in | Wt 185.0 lb

## 2022-07-19 DIAGNOSIS — R0789 Other chest pain: Secondary | ICD-10-CM

## 2022-07-19 DIAGNOSIS — J0110 Acute frontal sinusitis, unspecified: Secondary | ICD-10-CM | POA: Diagnosis not present

## 2022-07-19 MED ORDER — AZITHROMYCIN 500 MG PO TABS: 500.0000 mg | ORAL_TABLET | Freq: Every day | ORAL | 0 refills | Status: DC

## 2022-07-19 NOTE — Progress Notes (Signed)
Acute Office Visit I connected with  [name] on 07/19/22 by a video enabled telemedicine application and verified that I am speaking with the correct person using two identifiers.   I discussed the limitations of evaluation and management by telemedicine. The patient expressed understanding and agreed to proceed.  Subjective:     Patient ID: [name], female    DOB: [birth year], 51 y.o.   MRN: [id number]  Chief Complaint  Patient presents with   Acute Home Visit    Patient  virutal visit- c/o  Left chest pain that travels  under breast around to back - coming and going x 1  1/2 week ago.  And now, bodyaches, Left ear pain, malaise, fever, ST,  head congestion, slight cough x yesterday.     HPI Patient is in today for acute visit. Pt has had left sided intermittent chest wall pain underneath her breast that shoots around to her back. She says when it first came on, it would occur when breathing in deep. Now the chest wall pain is more constant.  She says this started 1.5 weeks ago. She had to travel for work to [facility] and she developed body aches all over, left ear pain, sore throat. She has tried Advil every 4-6 hours. She has also tried nasal saline spray. She was concerned with chest pain. No sob or wheezing. No cough.   Review of Systems  Constitutional:  Positive for fever. Negative for malaise/fatigue.  HENT:  Positive for ear pain and sore throat.   Respiratory:  Negative for cough, hemoptysis, sputum production, shortness of breath and wheezing.   Cardiovascular:  Positive for chest pain.        Objective:    BP 107/78 Comment: patient self reporting  Pulse 75 Comment: patient self reporting  Ht '5\' 5"'$  (1.651 m)   Wt 185 lb (83.9 kg) Comment: patient self reporting  BMI 30.79 kg/m    Physical Exam Constitutional:      Appearance: Normal appearance. She is normal weight.  HENT:     Head: Normocephalic and atraumatic.     Right Ear: Tympanic  membrane normal.     Left Ear: Tympanic membrane normal.     Nose: Nose normal.  Eyes:     Extraocular Movements: Extraocular movements intact.     Conjunctiva/sclera: Conjunctivae normal.  Pulmonary:     Effort: Pulmonary effort is normal.  Neurological:     General: No focal deficit present.     Mental Status: She is alert and oriented to person, place, and time. Mental status is at baseline.  Psychiatric:        Mood and Affect: Mood normal.        Behavior: Behavior normal.        Thought Content: Thought content normal.        Judgment: Judgment normal.    No results found for any visits on 07/19/22.      Assessment & Plan:   Problem List Items Addressed This Visit   None Visit Diagnoses     Acute non-recurrent frontal sinusitis    -  Primary   Relevant Medications   azithromycin (ZITHROMAX) 500 MG tablet   Left-sided chest wall pain       Relevant Orders   DG Chest 2 View       Meds ordered this encounter  Medications   azithromycin (ZITHROMAX) 500 MG tablet    Sig: Take 1 tablet (500 mg total)  by mouth daily.    Dispense:  3 tablet    Refill:  0  Will send for CXR to rule out developing PNA with URI symptoms now present. Sent in Azithromycin. If chest pains continue, to go ER.  Continue advil prn for chest wall pain. .  No follow-ups on file.  Leeanne Rio, MD  I connected with  [name] on 07/19/22 by a video enabled telemedicine application and verified that I am speaking with the correct person using two identifiers.   I discussed the limitations of evaluation and management by telemedicine. The patient expressed understanding and agreed to proceed.

## 2022-07-22 ENCOUNTER — Encounter: Payer: Self-pay | Admitting: Family Medicine

## 2022-07-26 ENCOUNTER — Other Ambulatory Visit: Payer: Self-pay | Admitting: Physician Assistant

## 2022-07-26 DIAGNOSIS — F411 Generalized anxiety disorder: Secondary | ICD-10-CM

## 2022-07-26 NOTE — Telephone Encounter (Signed)
Last OV: 01/25/22 Next OV: no appt scheduled Last RF: 01/25/22

## 2022-07-29 NOTE — Telephone Encounter (Signed)
Scheduled for 08/08/23 @ 11:10. Tvt

## 2022-07-29 NOTE — Telephone Encounter (Signed)
Needs appt but will refill once.

## 2022-07-29 NOTE — Telephone Encounter (Signed)
Needs appt

## 2022-08-07 ENCOUNTER — Ambulatory Visit: Payer: No Typology Code available for payment source | Admitting: Physician Assistant

## 2022-08-19 ENCOUNTER — Ambulatory Visit: Payer: No Typology Code available for payment source | Admitting: Physician Assistant

## 2022-08-19 ENCOUNTER — Encounter: Payer: Self-pay | Admitting: Physician Assistant

## 2022-08-19 VITALS — BP 118/72 | HR 70 | Ht 65.0 in | Wt 193.0 lb

## 2022-08-19 DIAGNOSIS — F411 Generalized anxiety disorder: Secondary | ICD-10-CM

## 2022-08-19 DIAGNOSIS — I493 Ventricular premature depolarization: Secondary | ICD-10-CM

## 2022-08-19 DIAGNOSIS — I10 Essential (primary) hypertension: Secondary | ICD-10-CM

## 2022-08-19 DIAGNOSIS — E785 Hyperlipidemia, unspecified: Secondary | ICD-10-CM | POA: Diagnosis not present

## 2022-08-19 DIAGNOSIS — E66811 Obesity, class 1: Secondary | ICD-10-CM

## 2022-08-19 DIAGNOSIS — E6609 Other obesity due to excess calories: Secondary | ICD-10-CM

## 2022-08-19 DIAGNOSIS — Z6832 Body mass index (BMI) 32.0-32.9, adult: Secondary | ICD-10-CM

## 2022-08-19 DIAGNOSIS — Z1329 Encounter for screening for other suspected endocrine disorder: Secondary | ICD-10-CM

## 2022-08-19 DIAGNOSIS — E559 Vitamin D deficiency, unspecified: Secondary | ICD-10-CM

## 2022-08-19 DIAGNOSIS — M5441 Lumbago with sciatica, right side: Secondary | ICD-10-CM

## 2022-08-19 DIAGNOSIS — Z79899 Other long term (current) drug therapy: Secondary | ICD-10-CM

## 2022-08-19 DIAGNOSIS — R7303 Prediabetes: Secondary | ICD-10-CM | POA: Diagnosis not present

## 2022-08-19 DIAGNOSIS — K760 Fatty (change of) liver, not elsewhere classified: Secondary | ICD-10-CM

## 2022-08-19 DIAGNOSIS — F5101 Primary insomnia: Secondary | ICD-10-CM

## 2022-08-19 MED ORDER — METFORMIN HCL 500 MG PO TABS: 500.0000 mg | ORAL_TABLET | Freq: Two times a day (BID) | ORAL | 1 refills | Status: DC

## 2022-08-19 MED ORDER — CYCLOBENZAPRINE HCL 10 MG PO TABS: 10.0000 mg | ORAL_TABLET | Freq: Three times a day (TID) | ORAL | 1 refills | Status: DC | PRN

## 2022-08-19 MED ORDER — MOUNJARO 12.5 MG/0.5ML ~~LOC~~ SOAJ: 12.5000 mg | SUBCUTANEOUS | 1 refills | Status: DC

## 2022-08-19 MED ORDER — TRAZODONE HCL 50 MG PO TABS: 25.0000 mg | ORAL_TABLET | Freq: Every evening | ORAL | 0 refills | Status: DC | PRN

## 2022-08-19 MED ORDER — ALPRAZOLAM 0.5 MG PO TABS: ORAL_TABLET | ORAL | 0 refills | Status: DC

## 2022-08-19 MED ORDER — NEBIVOLOL HCL 5 MG PO TABS: 5.0000 mg | ORAL_TABLET | Freq: Every day | ORAL | 1 refills | Status: DC

## 2022-08-19 MED ORDER — SERTRALINE HCL 50 MG PO TABS: 50.0000 mg | ORAL_TABLET | Freq: Every day | ORAL | 1 refills | Status: DC

## 2022-08-19 NOTE — Progress Notes (Signed)
Established Patient Office Visit  Subjective   Patient ID: [name], female    DOB: August 29, 1970  Age: 52 y.o. MRN: [id number]  Chief Complaint  Patient presents with   Follow-up    HPI Patient is a 52 year old obese female with hypertension, anxiety, dyslipidemia, PVCs who presents to the clinic for follow-up and medication refills.  Overall she is doing well.  She did stop Mounjaro over Christmas for about 2 weeks.  She did gain weight during this timeframe about 10 pounds.  She did restart Mounjaro at 12.5 mg and has noticed indigestion mostly at night but it does keep her up.  She is exercising by walking and trying to watch her portion sizes.  She does want to stay on the [facility].  Patient denies any chest pains or palpitations.  She is compliant with her medications.  Overall her mood is good on Zoloft daily.  She does use Xanax as needed for anxiety.  She does need a refill today. She is having problems going and staying asleep. Unisom has not worked. Melatonin gives her headaches.    .. Active Ambulatory Problems    Diagnosis Date Noted   Anxiety state 06/08/2007   Essential hypertension 06/08/2007   PALPITATIONS 04/26/2008   Bilateral lower abdominal cramping 11/04/2007   LUMBAR STRAIN 06/10/2007   Breast mass, right 01/19/2014   Sinus tachycardia 01/26/2014   Hyperlipidemia 01/31/2014   PVC's (premature ventricular contractions) 05/31/2014   Other fatigue 09/19/2014   Dry skin 09/19/2014   OSA (obstructive sleep apnea) 04/03/2015   Family history of breast cancer 12/12/2015   Family history of melanoma 12/12/2015   Right thyroid nodule 01/29/2016   Uveitis, intermediate 02/29/2016   Mass in neck 05/21/2016   Chronic fatigue syndrome 09/17/2016   Obesity (BMI 35.0-39.9 without comorbidity) 09/19/2016   Drug-induced constipation 11/18/2016   Dyslipidemia (high LDL; low HDL) 01/13/2017   Vitamin D deficiency 09/14/2017   Prediabetes 02/13/2018   Colon  cancer high risk 02/13/2018   Insulin resistance 06/21/2018   Depressed mood 10/27/2018   Left foot pain 09/29/2019   COVID-19 virus infection 24/23/5361   Nonalcoholic hepatosteatosis 44/31/5400   Metabolic syndrome 86/76/1950   At risk for side effect of medication 12/13/2020   Class 1 obesity due to excess calories with serious comorbidity and body mass index (BMI) of 30.0 to 30.9 in adult 12/27/2020   Acne vulgaris 12/27/2020   At risk for activity intolerance 01/01/2021   Uterine fibroid 01/02/2021   Diarrhea 03/14/2021   Left lower quadrant abdominal pain 03/14/2021   Bilateral hearing loss 03/30/2021   Tinnitus of both ears 03/30/2021   Acute right-sided low back pain with right-sided sciatica 06/19/2021   Protrusion of lumbar intervertebral disc 06/20/2021   Preoperative clearance 01/29/2022   Primary insomnia 08/19/2022   Resolved Ambulatory Problems    Diagnosis Date Noted   Weight gain 09/19/2014   DUB (dysfunctional uterine bleeding) 06/28/2015   Redness of left eye 12/12/2015   Pelvic pain 02/13/2018   Class 1 obesity with serious comorbidity and body mass index (BMI) of 34.0 to 34.9 in adult 10/27/2018   Menorrhagia with irregular cycle 12/26/2020   Thickened endometrium 01/02/2021   Irregular bleeding    Past Medical History:  Diagnosis Date   Anemia    Anxiety    Arthritis    Back pain    Gallbladder problem    High cholesterol    Hypertension    Hypothyroidism    IBS (irritable  bowel syndrome)    Joint pain    Low vitamin D level    Obesity (BMI 30-39.9)    PCOS (polycystic ovarian syndrome)     ROS See HPI.    Objective:     BP 118/72   Pulse 70   Ht '5\' 5"'$  (1.651 m)   Wt 193 lb (87.5 kg)   SpO2 97%   BMI 32.12 kg/m  BP Readings from Last 3 Encounters:  08/19/22 118/72  07/19/22 107/78  01/25/22 108/74   Wt Readings from Last 3 Encounters:  08/19/22 193 lb (87.5 kg)  07/19/22 185 lb (83.9 kg)  01/25/22 186 lb (84.4 kg)    ..     08/19/2022   11:24 AM 01/25/2022    4:01 PM 12/26/2020    4:01 PM 04/05/2020    8:26 AM 03/31/2020   10:27 AM  Depression screen PHQ 2/9  Decreased Interest 0 0 0 2 0  Down, Depressed, Hopeless 0 0 0 2 0  PHQ - 2 Score 0 0 0 4 0  Altered sleeping '3  1 3   '$ Tired, decreased energy '2  1 3   '$ Change in appetite 0  0 3   Feeling bad or failure about yourself  0  0 2   Trouble concentrating 0  0 1   Moving slowly or fidgety/restless 0  0 1   Suicidal thoughts 0  0 0   PHQ-9 Score '5  2 17   '$ Difficult doing work/chores Somewhat difficult  Not difficult at all Somewhat difficult    ..    08/19/2022   11:25 AM 12/26/2020    4:01 PM 09/29/2019    2:06 PM 08/02/2019   10:37 AM  GAD 7 : Generalized Anxiety Score  Nervous, Anxious, on Edge 0 1 0 0  Control/stop worrying 0 1 0 0  Worry too much - different things 0 0 0 0  Trouble relaxing 1 0 1 0  Restless 0 0 1 0  Easily annoyed or irritable 0 1 1 0  Afraid - awful might happen 0 0 0 0  Total GAD 7 Score '1 3 3 '$ 0  Anxiety Difficulty Not difficult at all Not difficult at all Not difficult at all Not difficult at all      Physical Exam Constitutional:      Appearance: Normal appearance. She is obese.  HENT:     Head: Normocephalic.  Cardiovascular:     Rate and Rhythm: Normal rate and regular rhythm.  Pulmonary:     Effort: Pulmonary effort is normal.  Musculoskeletal:     Right lower leg: No edema.     Left lower leg: No edema.  Neurological:     General: No focal deficit present.     Mental Status: She is alert and oriented to person, place, and time.  Psychiatric:        Mood and Affect: Mood normal.      The 10-year ASCVD risk score (Arnett DK, et al., 2019) is: 2.3%    Assessment & Plan:  Marland KitchenMarland KitchenKura was seen today for follow-up.  Diagnoses and all orders for this visit:  Essential hypertension -     COMPLETE METABOLIC PANEL WITH GFR  Prediabetes -     COMPLETE METABOLIC PANEL WITH GFR -     Hemoglobin A1c -      metFORMIN (GLUCOPHAGE) 500 MG tablet; Take 1 tablet (500 mg total) by mouth 2 (two) times daily with a meal.  Dyslipidemia (high LDL; low HDL) -     Lipid Panel w/reflex Direct LDL  Vitamin D deficiency -     Vitamin D (25 hydroxy)  Thyroid disorder screen -     TSH  Medication management -     TSH -     Lipid Panel w/reflex Direct LDL -     COMPLETE METABOLIC PANEL WITH GFR -     CBC with Differential/Platelet -     Vitamin D (25 hydroxy) -     Hemoglobin A1c  Anxiety state -     ALPRAZolam (XANAX) 0.5 MG tablet; Take 1 tablet by mouth twice daily as needed for anxiety -     sertraline (ZOLOFT) 50 MG tablet; Take 1 tablet (50 mg total) by mouth daily.  Acute right-sided low back pain with right-sided sciatica -     cyclobenzaprine (FLEXERIL) 10 MG tablet; Take 1 tablet (10 mg total) by mouth 3 (three) times daily as needed for muscle spasms.  Nonalcoholic hepatosteatosis -     metFORMIN (GLUCOPHAGE) 500 MG tablet; Take 1 tablet (500 mg total) by mouth 2 (two) times daily with a meal.  PVC's (premature ventricular contractions) -     nebivolol (BYSTOLIC) 5 MG tablet; Take 1 tablet (5 mg total) by mouth daily.  Class 1 obesity due to excess calories without serious comorbidity with body mass index (BMI) of 32.0 to 32.9 in adult -     tirzepatide (MOUNJARO) 12.5 MG/0.5ML Pen; Inject 12.5 mg into the skin once a week.  Primary insomnia -     traZODone (DESYREL) 50 MG tablet; Take 0.5-1 tablets (25-50 mg total) by mouth at bedtime as needed for sleep.    Fasting labs ordered today.  Xanax as needed refilled.  PHQ and GAD stable. Refilled zoloft.  Vitals look great. Refilled bystolic.  Start trazodone 1 hour before bed start '25mg'$  up to '100mg'$ .  Follow up in 3-6 months.   Return in about 6 months (around 02/17/2023).    Iran Planas, PA-C

## 2022-08-20 ENCOUNTER — Encounter: Payer: Self-pay | Admitting: Neurology

## 2022-08-20 LAB — CBC WITH DIFFERENTIAL/PLATELET
Absolute Monocytes: 449 cells/uL (ref 200–950)
Basophils Absolute: 70 cells/uL (ref 0–200)
Basophils Relative: 0.8 %
Eosinophils Absolute: 150 cells/uL (ref 15–500)
Eosinophils Relative: 1.7 %
HCT: 42 % (ref 35.0–45.0)
Hemoglobin: 14.4 g/dL (ref 11.7–15.5)
Lymphs Abs: 3520 cells/uL (ref 850–3900)
MCH: 31.9 pg (ref 27.0–33.0)
MCHC: 34.3 g/dL (ref 32.0–36.0)
MCV: 93.1 fL (ref 80.0–100.0)
MPV: 11.1 fL (ref 7.5–12.5)
Monocytes Relative: 5.1 %
Neutro Abs: 4611 cells/uL (ref 1500–7800)
Neutrophils Relative %: 52.4 %
Platelets: 350 10*3/uL (ref 140–400)
RBC: 4.51 10*6/uL (ref 3.80–5.10)
RDW: 13.2 % (ref 11.0–15.0)
Total Lymphocyte: 40 %
WBC: 8.8 10*3/uL (ref 3.8–10.8)

## 2022-08-20 LAB — HEMOGLOBIN A1C
Hgb A1c MFr Bld: 5.4 % of total Hgb (ref <5.7)
Mean Plasma Glucose: 108 mg/dL
eAG (mmol/L): 6 mmol/L

## 2022-08-20 LAB — LIPID PANEL W/REFLEX DIRECT LDL
Cholesterol: 252 mg/dL — ABNORMAL HIGH (ref <200)
HDL: 64 mg/dL (ref >=50)
LDL Cholesterol (Calc): 156 mg/dL (calc) — ABNORMAL HIGH
Non-HDL Cholesterol (Calc): 188 mg/dL (calc) — ABNORMAL HIGH (ref <130)
Total CHOL/HDL Ratio: 3.9 (calc) (ref <5.0)
Triglycerides: 183 mg/dL — ABNORMAL HIGH (ref <150)

## 2022-08-20 LAB — COMPLETE METABOLIC PANEL WITH GFR
AG Ratio: 1.6 (calc) (ref 1.0–2.5)
ALT: 14 U/L (ref 6–29)
AST: 15 U/L (ref 10–35)
Albumin: 4.4 g/dL (ref 3.6–5.1)
Alkaline phosphatase (APISO): 63 U/L (ref 37–153)
BUN: 11 mg/dL (ref 7–25)
CO2: 28 mmol/L (ref 20–32)
Calcium: 9.3 mg/dL (ref 8.6–10.4)
Chloride: 106 mmol/L (ref 98–110)
Creat: 0.56 mg/dL (ref 0.50–1.03)
Globulin: 2.8 g/dL (calc) (ref 1.9–3.7)
Glucose, Bld: 90 mg/dL (ref 65–99)
Potassium: 5.2 mmol/L (ref 3.5–5.3)
Sodium: 141 mmol/L (ref 135–146)
Total Bilirubin: 0.4 mg/dL (ref 0.2–1.2)
Total Protein: 7.2 g/dL (ref 6.1–8.1)
eGFR: 109 mL/min/{1.73_m2} (ref >=60)

## 2022-08-20 LAB — TSH: TSH: 2.81 mIU/L

## 2022-08-20 LAB — VITAMIN D 25 HYDROXY (VIT D DEFICIENCY, FRACTURES): Vit D, 25-Hydroxy: 49 ng/mL (ref 30–100)

## 2022-08-20 NOTE — Progress Notes (Signed)
..  The 10-year ASCVD risk score (Arnett DK, et al., 2019) is: 2.1%   Values used to calculate the score:     Age: 52 years     Sex: Female     Is Non-Hispanic African American: No     Diabetic: No     Tobacco smoker: No     Systolic Blood Pressure: 073 mmHg     Is BP treated: Yes     HDL Cholesterol: 64 mg/dL     Total Cholesterol: 252 mg/dL  Labs look good except for LDL and TG.  TG are much better and almost to goal.  LDL still elevated.  10 year risk overall is low but due to pre-diabetes etc I would consider a statin. Thoughts?

## 2022-08-31 ENCOUNTER — Other Ambulatory Visit: Payer: Self-pay | Admitting: Physician Assistant

## 2022-08-31 DIAGNOSIS — E6609 Other obesity due to excess calories: Secondary | ICD-10-CM

## 2022-09-12 ENCOUNTER — Telehealth (HOSPITAL_BASED_OUTPATIENT_CLINIC_OR_DEPARTMENT_OTHER): Payer: Self-pay | Admitting: *Deleted

## 2022-09-12 NOTE — Telephone Encounter (Signed)
Spoke with pt regarding breast MRI order. Advised that she would need a screening mammogram done first. Pt states that last year it was done without screening due to her dense breast and family history. Advised that I would make provider aware that she would like to skip the screening this year.

## 2022-09-12 NOTE — Telephone Encounter (Signed)
Patient stated she was returning [name]'s call.

## 2022-09-16 ENCOUNTER — Other Ambulatory Visit: Payer: Self-pay | Admitting: Physician Assistant

## 2022-09-16 DIAGNOSIS — I493 Ventricular premature depolarization: Secondary | ICD-10-CM

## 2022-10-07 IMAGING — MG MM BREAST BX W LOC DEV 1ST LESION IMAGE BX SPEC STEREO GUIDE*L*
8 of 10 series · 8 of 18 positions shown · non-contrast
Comparison: Previous exams.
COMPARISON: Previous exams.

Addendum:
CLINICAL DATA: 49-year-old with a screening detected 1.6 cm group
of indeterminate calcifications within loosely grouped regional
calcifications involving the UPPER OUTER QUADRANT the LEFT breast.

EXAM:
LEFT BREAST STEREOTACTIC CORE NEEDLE BIOPSY

[L (1 of 5)]
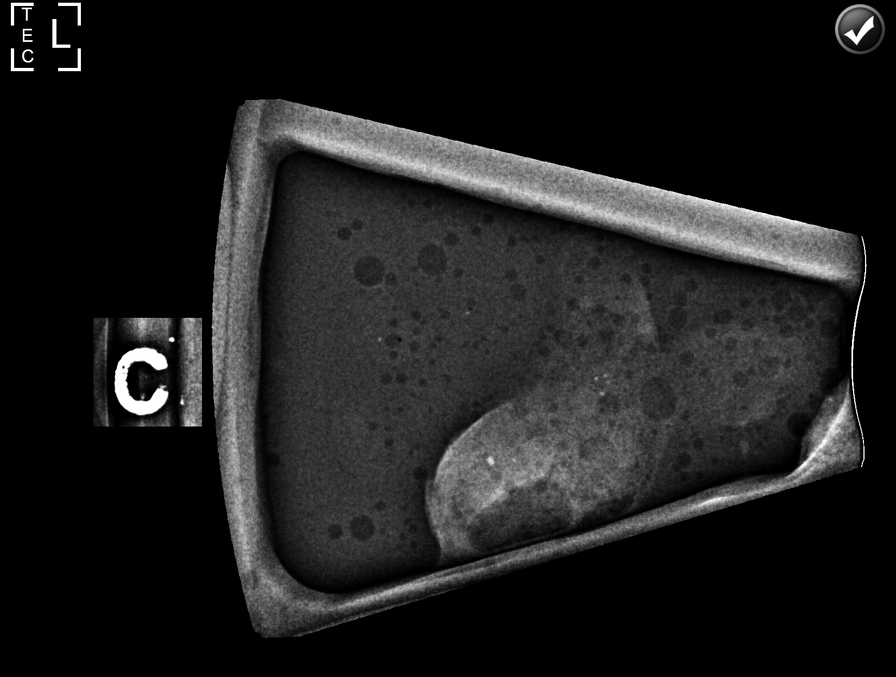

[L (2 of 5)]
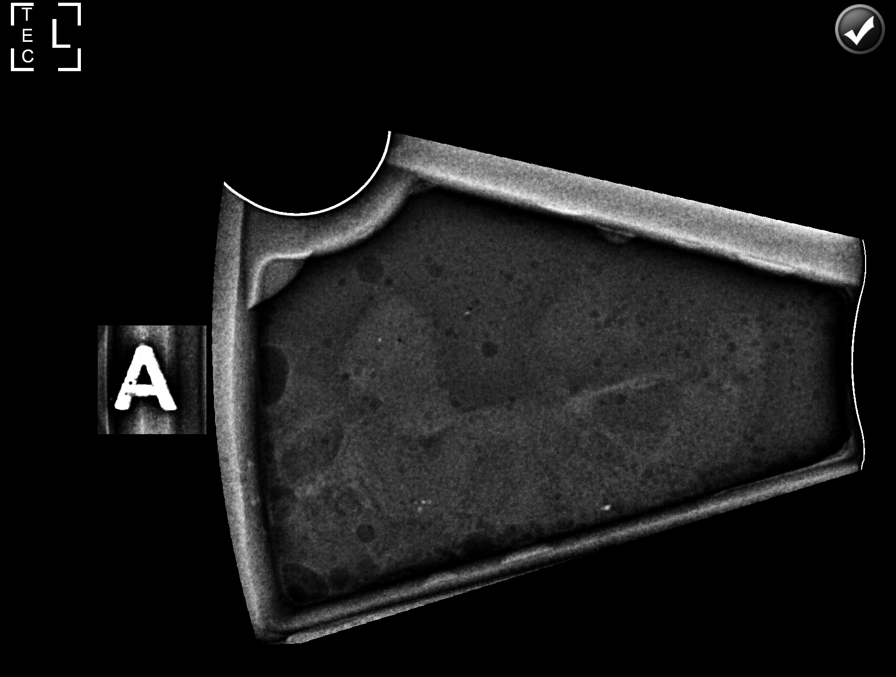

[L (3 of 5)]
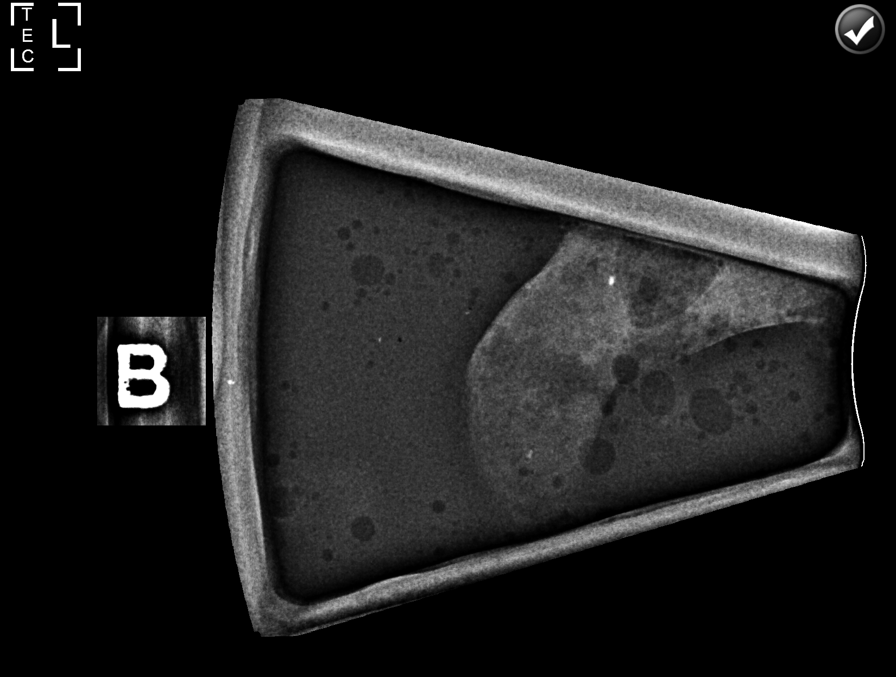

[L (4 of 5)]
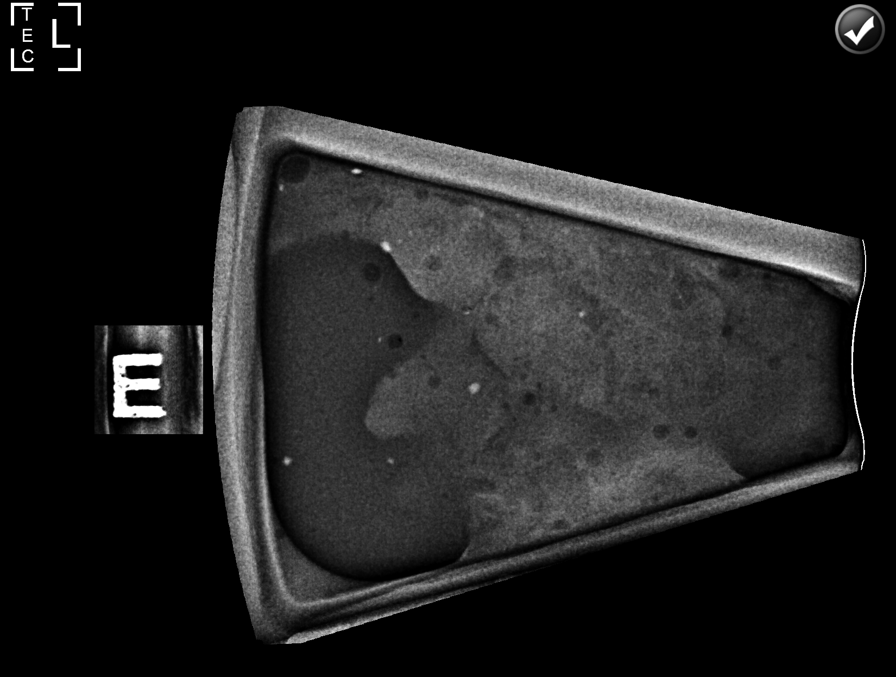

[L (5 of 5)]
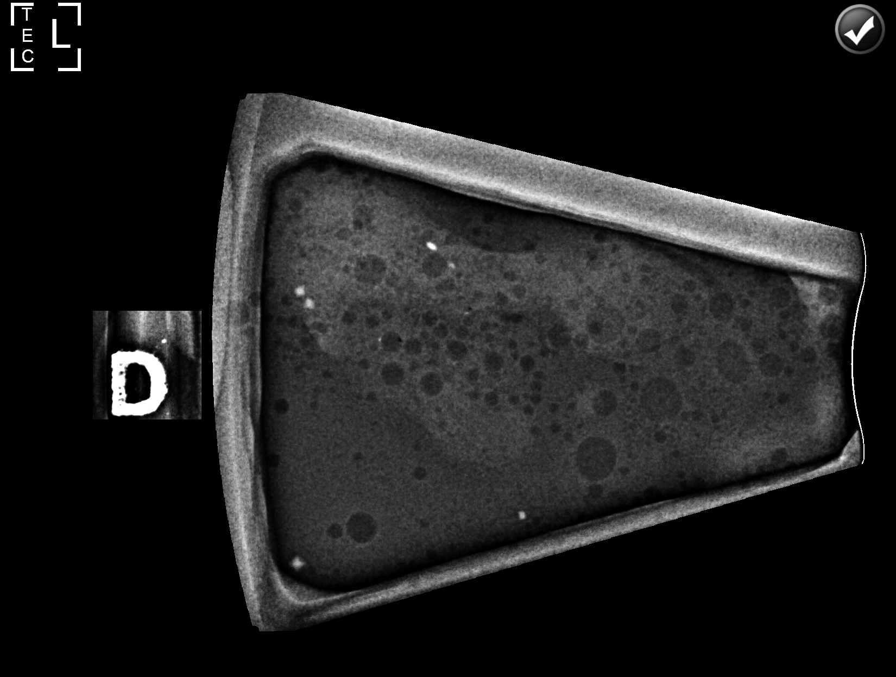

[L CC (1 of 3)]
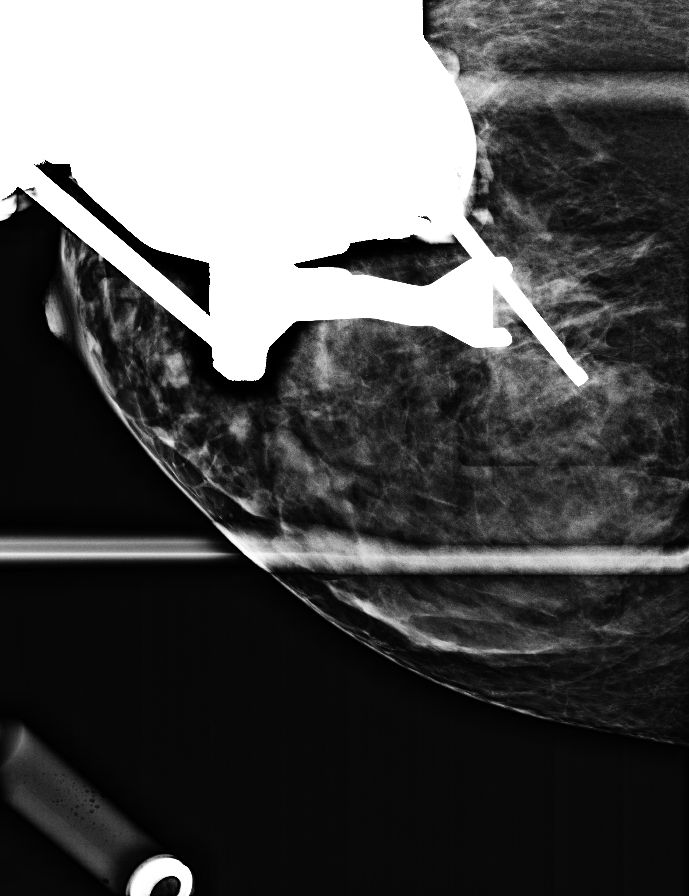

[L CC (2 of 3)]
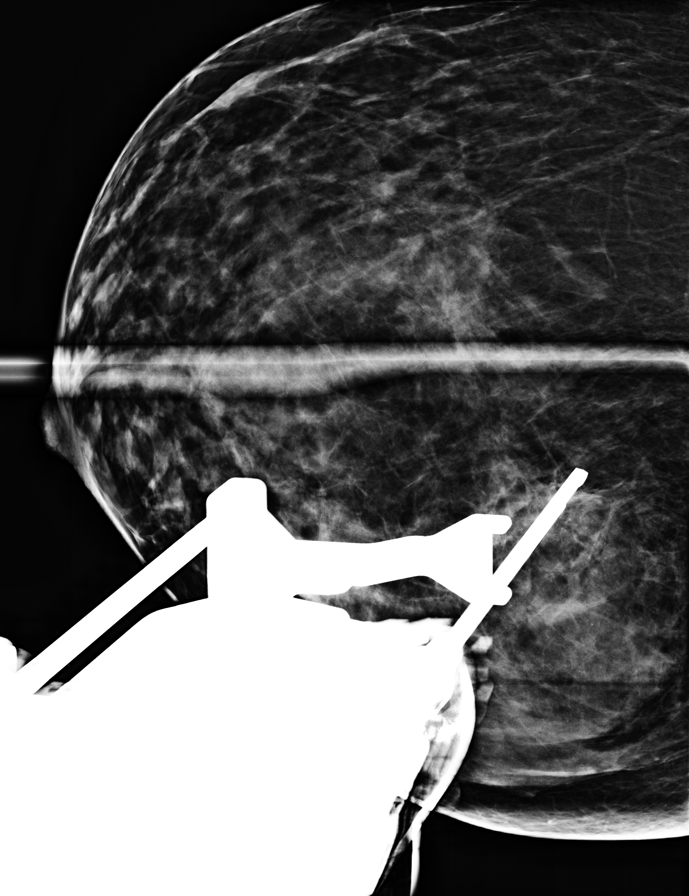

[L CC (3 of 3)]
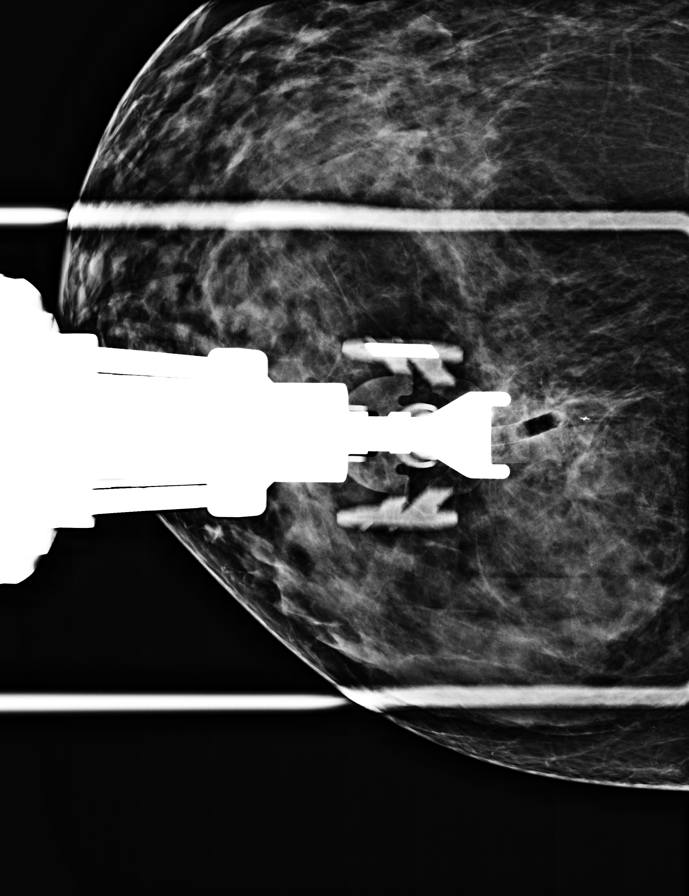

[8 of 18 positions shown; findings below may reference images not displayed]



Lesion quadrant: UPPER OUTER QUADRANT.

Using sterile technique with chlorhexidine as skin antisepsis, 1%
lidocaine and 1% lidocaine with epinephrine as local anesthetic,
under stereotactic guidance, a 9 Brevera gauge vacuum assisted
device was used to perform core needle biopsy of the grouped
calcifications involving the UPPER OUTER QUADRANT of the LEFT breast
using a superior approach. Specimen radiograph was performed showing
calcifications in all 5 of the core samples. Specimens with
calcifications are identified for pathology.

At the conclusion of the procedure, an X shaped tissue marker clip
was deployed into the biopsy cavity. Follow-up 2-view mammogram was
performed and dictated separately.
IMPRESSION: Stereotactic-guided biopsy of an indeterminate 1.6 cm group of
calcifications within loosely grouped regional calcifications
involving the UPPER OUTER QUADRANT of the LEFT breast. No apparent
complications.

ADDENDUM:
Pathology revealed FIBROCYSTIC CHANGE WITH CALCIFICATIONS,
PSEUDOANGIOMATOUS STROMAL HYPERPLASIA (PASH)- NO MALIGNANCY
IDENTIFIED of the LEFT breast, upper outer quadrant, X clip. This
was found to be concordant by Dr. Harlan Job.

Pathology results were discussed with the patient by telephone. The
patient reported doing well after the biopsy with tenderness at the
site. Post biopsy instructions and care were reviewed and questions
were answered. The patient was encouraged to call The [REDACTED]

The patient was instructed to return for annual screening
mammography and was informed a reminder notice would be sent
regarding this appointment.

Pathology results reported by Rabia Espinal RN on 01/30/2021.



Lesion quadrant: UPPER OUTER QUADRANT.

Using sterile technique with chlorhexidine as skin antisepsis, 1%
lidocaine and 1% lidocaine with epinephrine as local anesthetic,
under stereotactic guidance, a 9 Brevera gauge vacuum assisted
device was used to perform core needle biopsy of the grouped
calcifications involving the UPPER OUTER QUADRANT of the LEFT breast
using a superior approach. Specimen radiograph was performed showing
calcifications in all 5 of the core samples. Specimens with
calcifications are identified for pathology.

At the conclusion of the procedure, an X shaped tissue marker clip
was deployed into the biopsy cavity. Follow-up 2-view mammogram was
performed and dictated separately.
IMPRESSION: Stereotactic-guided biopsy of an indeterminate 1.6 cm group of
calcifications within loosely grouped regional calcifications
involving the UPPER OUTER QUADRANT of the LEFT breast. No apparent
complications.

## 2022-10-15 ENCOUNTER — Ambulatory Visit (HOSPITAL_BASED_OUTPATIENT_CLINIC_OR_DEPARTMENT_OTHER): Payer: No Typology Code available for payment source | Admitting: Obstetrics & Gynecology

## 2022-10-15 ENCOUNTER — Encounter (HOSPITAL_BASED_OUTPATIENT_CLINIC_OR_DEPARTMENT_OTHER): Payer: Self-pay | Admitting: Obstetrics & Gynecology

## 2022-10-15 VITALS — BP 116/79 | HR 77 | Ht 66.0 in | Wt 195.6 lb

## 2022-10-15 DIAGNOSIS — Z9189 Other specified personal risk factors, not elsewhere classified: Secondary | ICD-10-CM | POA: Diagnosis not present

## 2022-10-15 DIAGNOSIS — Z01419 Encounter for gynecological examination (general) (routine) without abnormal findings: Secondary | ICD-10-CM | POA: Diagnosis not present

## 2022-10-15 DIAGNOSIS — Z1231 Encounter for screening mammogram for malignant neoplasm of breast: Secondary | ICD-10-CM | POA: Diagnosis not present

## 2022-10-15 NOTE — Progress Notes (Signed)
52 y.o. [id number] Married White or Caucasian female here for annual exam.  Needs breast MRI scheduled.  Will need MMG first.  This is ordered today for her.  Once that is back, will then proceed with breast MRI.  Tyrer Cusick model with 37.7%.    She did have a breast reduction in 09/2022.  Very pleased with this.  Dr. Sandi Mealy at [facility].    Denies vaginal bleeding.   No LMP recorded. Patient is perimenopausal.          Sexually active: Yes.    The current method of family planning is post menopausal status.    Smoker:  no  Health Maintenance: Pap:  01/2021, HR HPV History of abnormal Pap:  no MMG:  01/2021 Colonoscopy:  03/02/2019.  Follow up is due.  Dr. Pearline Cables recommended 2 years.   BMD:   guidelines reviewed Screening Labs: 08/2022. Lipids were elevated.  She is going to have this repeated in 6 months.   reports that she has quit smoking. She has never used smokeless tobacco. She reports current alcohol use. She reports that she does not use drugs.  Past Medical History:  Diagnosis Date   Anemia    Anxiety    Arthritis    Left Foot   Back pain    Chronic fatigue syndrome    Gallbladder problem    High cholesterol    Hyperlipidemia    Hypertension    Hypothyroidism    IBS (irritable bowel syndrome)    Insulin resistance    Joint pain    Low vitamin D level    Obesity (BMI 30-39.9)    Palpitations    PCOS (polycystic ovarian syndrome)    PVC's (premature ventricular contractions)    Vitamin D deficiency     Past Surgical History:  Procedure Laterality Date   BREAST REDUCTION SURGERY Bilateral    CHOLECYSTECTOMY     DILATATION & CURETTAGE/HYSTEROSCOPY WITH MYOSURE N/A 02/21/2021   Procedure: DILATATION & CURETTAGE/HYSTEROSCOPY WITH MYOSURE;  Surgeon: Megan Salon, MD;  Location: [place] [facility];  Service: Gynecology;  Laterality: N/A;    Current Outpatient Medications  Medication Sig Dispense Refill   ALPRAZolam (XANAX) 0.5 MG tablet  Take 1 tablet by mouth twice daily as needed for anxiety 180 tablet 0   Cholecalciferol (VITAMIN D) 125 MCG (5000 UT) CAPS Take 5,000 Units by mouth daily.      metFORMIN (GLUCOPHAGE) 500 MG tablet Take 1 tablet (500 mg total) by mouth 2 (two) times daily with a meal. 180 tablet 1   nebivolol (BYSTOLIC) 5 MG tablet TAKE 1 TABLET DAILY (APPOINTMENT FOR FURTHER REFILLS) 90 tablet 1   sertraline (ZOLOFT) 50 MG tablet Take 1 tablet (50 mg total) by mouth daily. 90 tablet 1   tirzepatide (MOUNJARO) 12.5 MG/0.5ML Pen Inject 12.5 mg into the skin once a week. 6 mL 1   traZODone (DESYREL) 50 MG tablet Take 0.5-1 tablets (25-50 mg total) by mouth at bedtime as needed for sleep. 90 tablet 0   cyclobenzaprine (FLEXERIL) 10 MG tablet Take 1 tablet (10 mg total) by mouth 3 (three) times daily as needed for muscle spasms. (Patient not taking: Reported on 10/15/2022) 270 tablet 1   No current facility-administered medications for this visit.    Family History  Problem Relation Age of Onset   Hyperlipidemia Mother    Hypertension Mother    Cancer Mother        breast cancer invasive/pancreatic cancer  Pancreatic cancer Mother    Pulmonary fibrosis Mother    Anxiety disorder Mother    Obesity Mother    Cancer Father    Alcohol abuse Father    Alcoholism Father    Cancer Sister        melanoma   Alcohol abuse Other        family hx   Arthritis Other        family hx   Hypertension Other        family hx   Stroke Other        <50 family hx   Colitis Other     ROS: Constitutional: negative Genitourinary:negative  Exam:   BP 116/79   Pulse 77   Ht '5\' 6"'$  (1.676 m)   Wt 195 lb 9.6 oz (88.7 kg)   BMI 31.57 kg/m   Height: '5\' 6"'$  (167.6 cm)  General appearance: alert, cooperative and appears stated age Head: Normocephalic, without obvious abnormality, atraumatic Neck: no adenopathy, supple, symmetrical, trachea midline and thyroid normal to inspection and palpation Lungs: clear to  auscultation bilaterally Breasts: normal appearance, no masses or tenderness Heart: regular rate and rhythm Abdomen: soft, non-tender; bowel sounds normal; no masses,  no organomegaly Extremities: extremities normal, atraumatic, no cyanosis or edema Skin: Skin color, texture, turgor normal. No rashes or lesions Lymph nodes: Cervical, supraclavicular, and axillary nodes normal. No abnormal inguinal nodes palpated Neurologic: Grossly normal   Pelvic: External genitalia: melanosis at introitus, stable              Urethra:  normal appearing urethra with no masses, tenderness or lesions              Bartholins and Skenes: normal                 Vagina: normal appearing vagina with normal color and no discharge, no lesions              Cervix: no lesions              Pap taken: No. Bimanual Exam:  Uterus:  normal size, contour, position, consistency, mobility, non-tender              Adnexa: normal adnexa and no mass, fullness, tenderness               Rectovaginal: Confirms               Anus:  normal sphincter tone, no lesions  Chaperone, Ezekiel Ina, RN, was present for exam.  Assessment/Plan: 1. Well woman exam with routine gynecological exam - Pap smear 01/2021 - Mammogram ordered.  Will plan breast MRI afterwards - Colonoscopy 2020.  Pt aware this is due.   - Bone mineral density not indicated - lab work done with PCP, Iran Planas - vaccines reviewed/updated  2. Encounter for screening mammogram for malignant neoplasm of breast - MM 3D SCREENING MAMMOGRAM BILATERAL BREAST; Future  3. Increased risk of breast cancer - breast MRI will be scheduled after MMG is finalized.

## 2022-10-24 ENCOUNTER — Other Ambulatory Visit: Payer: Self-pay | Admitting: Obstetrics & Gynecology

## 2022-10-24 DIAGNOSIS — Z1231 Encounter for screening mammogram for malignant neoplasm of breast: Secondary | ICD-10-CM

## 2022-11-02 ENCOUNTER — Other Ambulatory Visit: Payer: Self-pay | Admitting: Physician Assistant

## 2022-11-02 DIAGNOSIS — F5101 Primary insomnia: Secondary | ICD-10-CM

## 2022-11-02 DIAGNOSIS — F411 Generalized anxiety disorder: Secondary | ICD-10-CM

## 2022-11-04 NOTE — Telephone Encounter (Signed)
Last written in January for 90 day supply, last appt in January, told to follow up in 6 months. Please sign.

## 2022-11-20 DIAGNOSIS — Z1231 Encounter for screening mammogram for malignant neoplasm of breast: Secondary | ICD-10-CM

## 2022-11-20 IMAGING — CT CT ABD-PELV W/ CM
2 of 5 series · 16 of 46 positions shown, 18 images · IV contrast (omnipaque)
Comparison: CT pelvis 11/01/2007

CLINICAL DATA: Left lower quadrant abdominal pain for 4 days,
alternating constipation and diarrhea.

EXAM:
CT ABDOMEN AND PELVIS WITH CONTRAST
TECHNIQUE: Multidetector CT imaging of the abdomen and pelvis was performed
using the standard protocol following bolus administration of
intravenous contrast.
CONTRAST:  100mL OMNIPAQUE IOHEXOL 300 MG/ML  SOLN

[Series 2: axial st · axial · 0.73mm/px · z∈[-520,-95]mm · 13 of 97 slices shown, 15 images]
[im 6/97  soft-tissue]
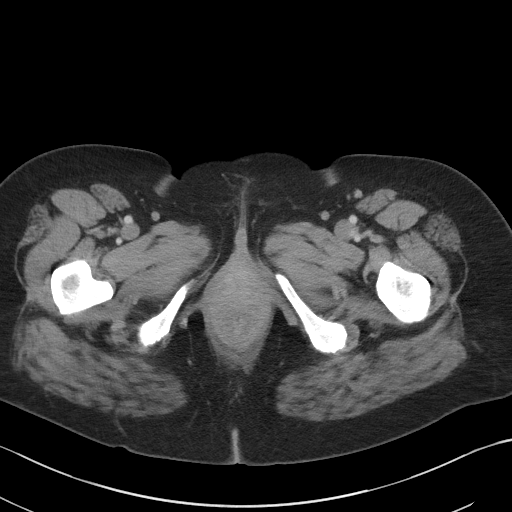
[im 6/97  bone]
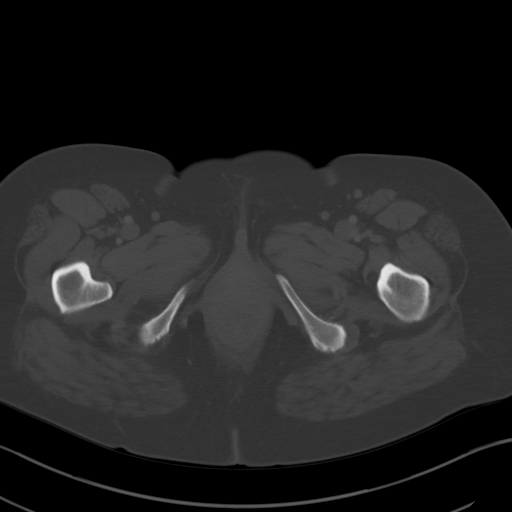
[im 11/97  soft-tissue]
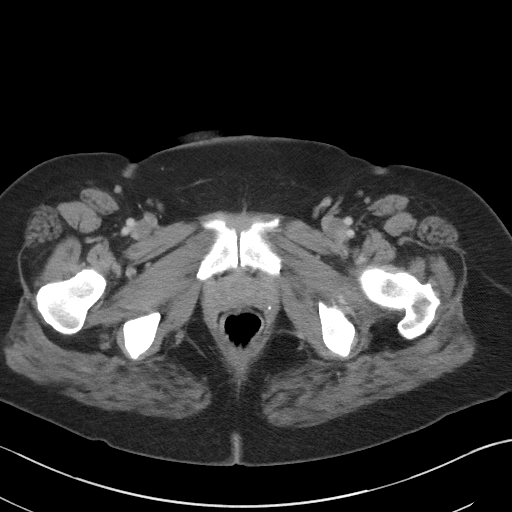
[im 22/97  soft-tissue]
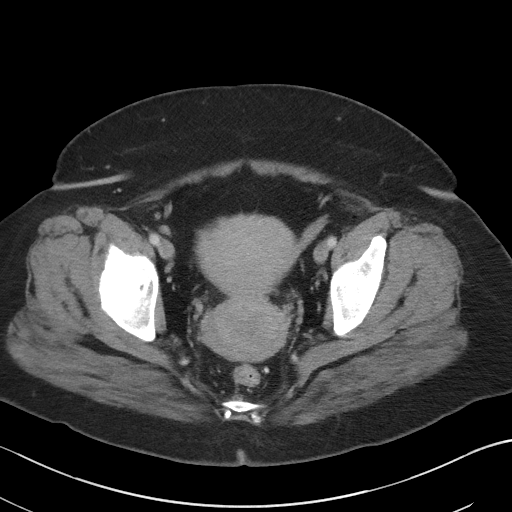
[im 27/97  soft-tissue]
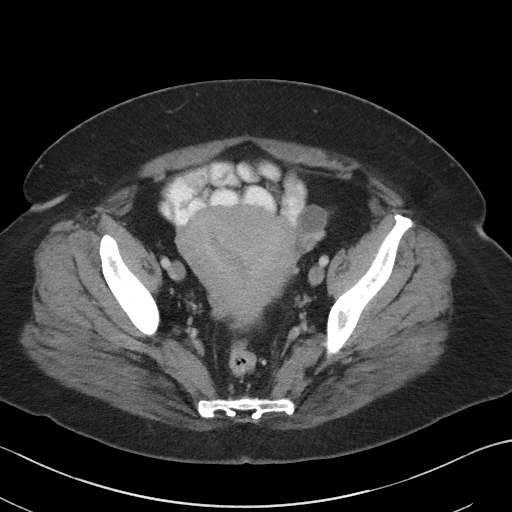
[im 33/97  soft-tissue]
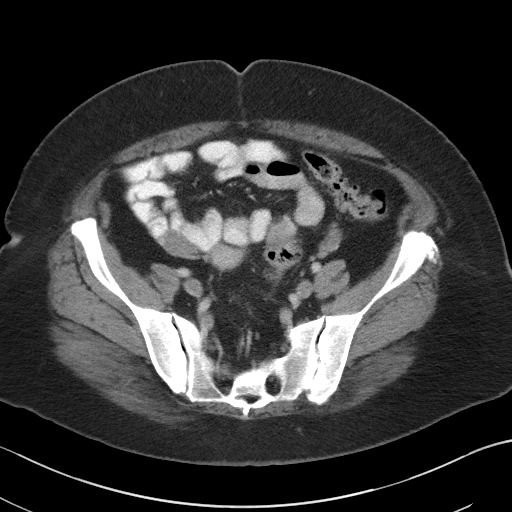
[im 43/97  soft-tissue]
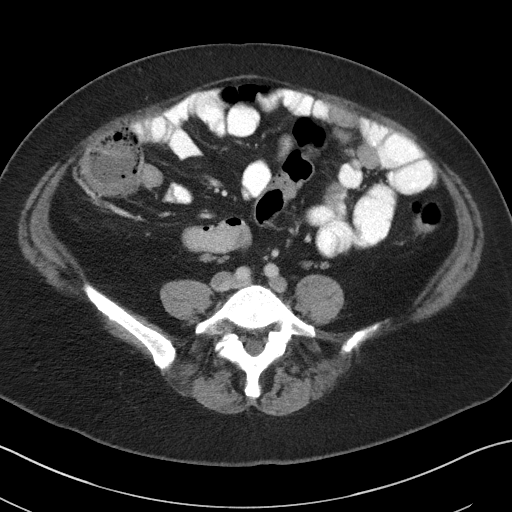
[im 49/97  soft-tissue]
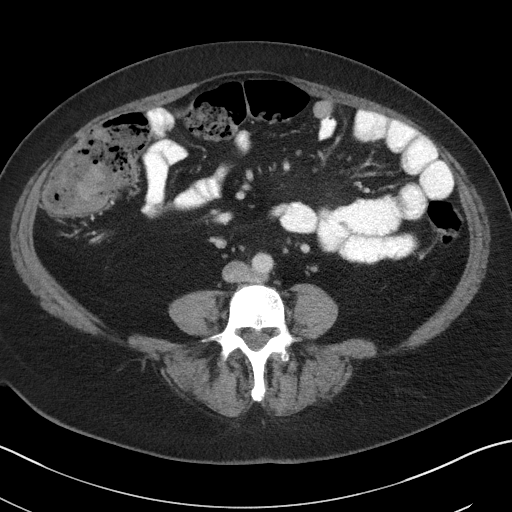
[im 54/97  soft-tissue]
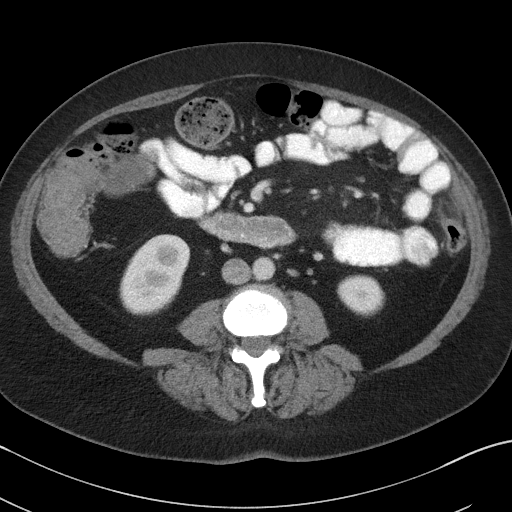
[im 65/97  soft-tissue]
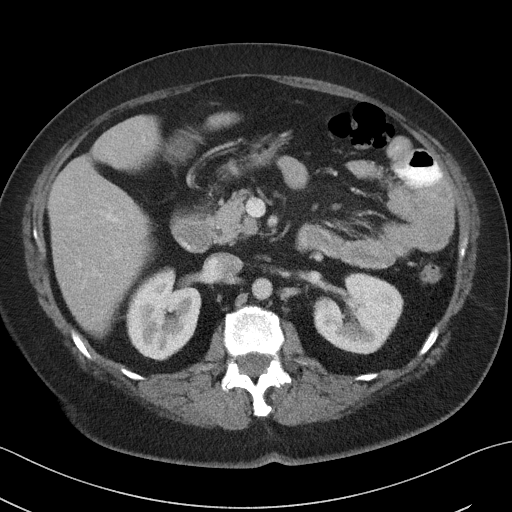
[im 65/97  bone]
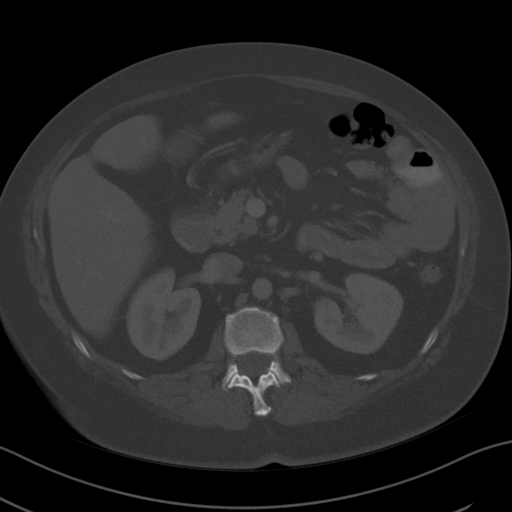
[im 70/97  soft-tissue]
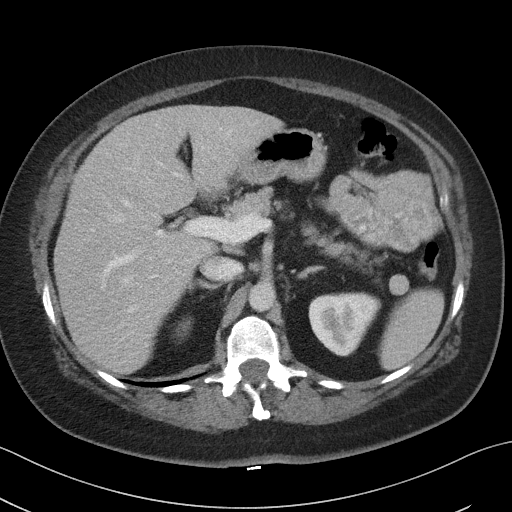
[im 75/97  soft-tissue]
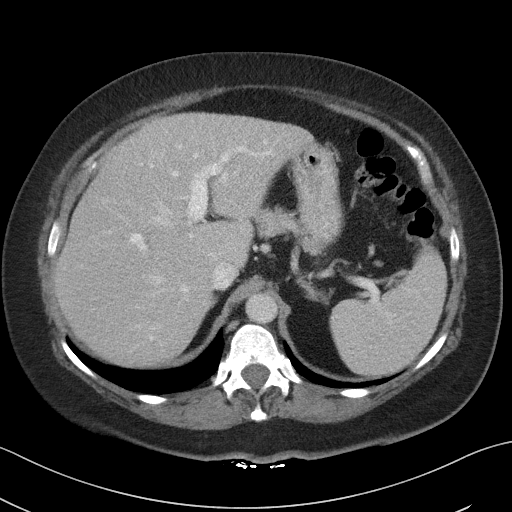
[im 86/97  soft-tissue]
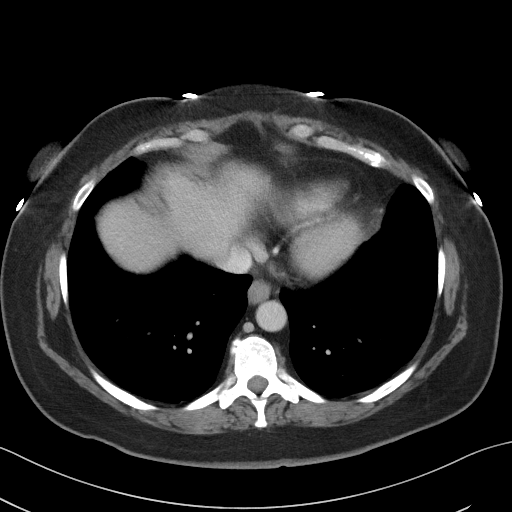
[im 91/97  soft-tissue]
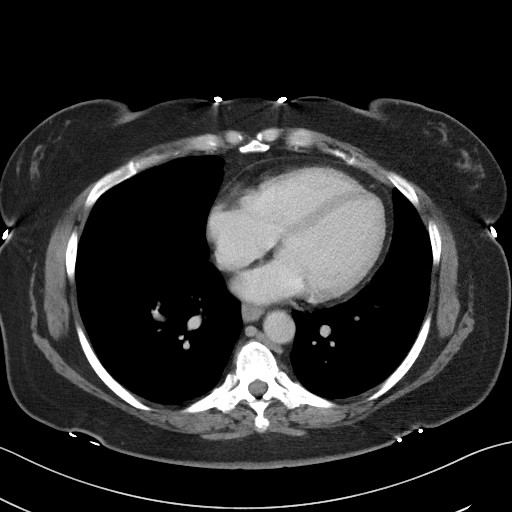

[Series 5: coronal st · coronal · 0.78mm/px · 3 of 94 slices shown]
[im 32/94  soft-tissue]
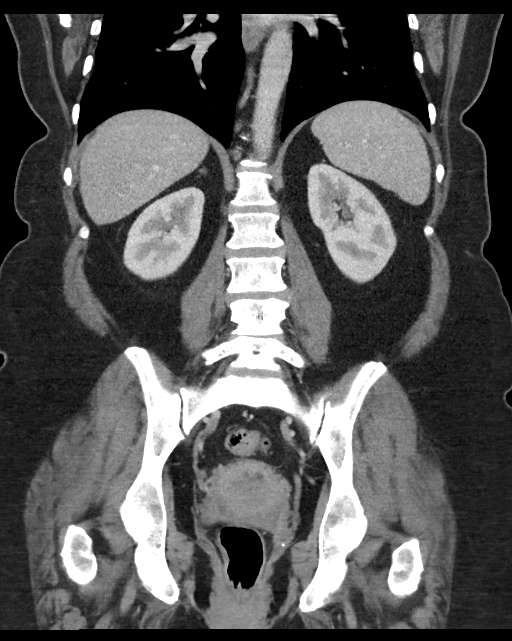
[im 42/94  soft-tissue]
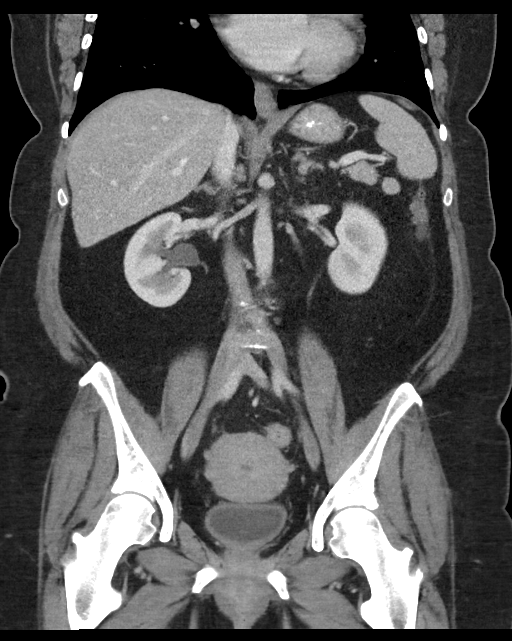
[im 52/94  soft-tissue]
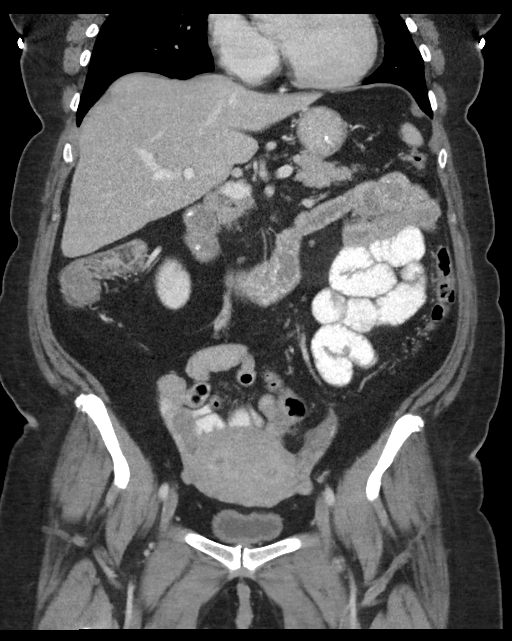

[16 of 46 positions shown; findings below may reference images not displayed]

FINDINGS: Lower chest: Unremarkable

Hepatobiliary: Cholecystectomy.  Otherwise unremarkable.

Pancreas: Unremarkable

Spleen: Unremarkable

Adrenals/Urinary Tract: Mild urinary bladder wall thickening is
likely mostly from nondistention, cannot exclude cystitis, correlate
with urine analysis. The kidneys and adrenal glands appear
unremarkable.

Stomach/Bowel: Abnormal stranding surrounding a lobule of adipose
tissue along the anterior margin of the descending colon on image 43
series 2, favoring appendagitis epiploica, less likely small omental
infarct. No dilated bowel. Appendix measures up to 0.7 cm in
diameter, upper normal size.

Vascular/Lymphatic: Unremarkable

Reproductive: Nabothian cysts of the cervix. Endometrial thickness
up to 1 cm. Small left ovarian cysts or follicles.

Other: No supplemental non-categorized findings.

Musculoskeletal: Chronic pars defects bilaterally at L5 but with
only about 2 mm of grade 1 anterolisthesis of L5 on S1. Disc bulge
and intervertebral spurring at L4-5.
IMPRESSION: 1. Abnormal stranding surrounding a ring of adipose tissue along the
anterior margin of the descending colon favoring appendagitis
epiploica, less likely small omental infarct.
2. Chronic pars defects at L5 but with only about 2 mm of grade 1
anterolisthesis of L5 on S1, and no significant impingement at this
level.

## 2022-12-20 ENCOUNTER — Ambulatory Visit (INDEPENDENT_AMBULATORY_CARE_PROVIDER_SITE_OTHER): Payer: No Typology Code available for payment source

## 2022-12-20 ENCOUNTER — Ambulatory Visit
Admission: RE | Admit: 2022-12-20 | Discharge: 2022-12-20 | Disposition: A | Discharge status: Home / Self Care | Payer: No Typology Code available for payment source | Source: Ambulatory Visit | Attending: Family Medicine | Admitting: Family Medicine

## 2022-12-20 VITALS — BP 105/72 | HR 84 | Temp 97.7°F | Resp 17

## 2022-12-20 DIAGNOSIS — K9089 Other intestinal malabsorption: Secondary | ICD-10-CM

## 2022-12-20 DIAGNOSIS — R1032 Left lower quadrant pain: Secondary | ICD-10-CM

## 2022-12-20 LAB — POCT URINALYSIS DIP (MANUAL ENTRY)
Bilirubin, UA: NEGATIVE
Blood, UA: NEGATIVE
Glucose, UA: NEGATIVE mg/dL
Leukocytes, UA: NEGATIVE
Nitrite, UA: NEGATIVE
Protein Ur, POC: NEGATIVE mg/dL
Spec Grav, UA: 1.025 (ref 1.010–1.025)
Urobilinogen, UA: 0.2 E.U./dL
pH, UA: 7 (ref 5.0–8.0)

## 2022-12-20 MED ORDER — CHOLESTYRAMINE 4 G PO PACK: PACK | ORAL | 1 refills | Status: DC

## 2022-12-20 MED ORDER — IOHEXOL 300 MG/ML  SOLN
100.0000 mL | Freq: Once | INTRAMUSCULAR | Status: AC | PRN
  Administered 2022-12-20: 100 mL via INTRAVENOUS

## 2022-12-20 NOTE — ED Provider Notes (Signed)
[facility]    CSN: [id number] Arrival date & time: 12/20/22  1420      History   Chief Complaint Chief Complaint  Patient presents with   Abdominal Pain    LT side    HPI [name] is a 52 y.o. female.   Patient complains of 3 week history of vague intermittent dull left lower quadrant abdominal pain that has recently started to radiate to her left flank and back.  Her symptoms have become worse during the past 4 to 5 days.  Furthermore she has had intermittent worsening diarrhea for several weeks.  She notes that she has always had occasional watery diarrhea since her cholecystectomy 25 years ago.  Her present diarrhea usually begins about 10 minutes after eating.  She feels well otherwise without nausea/vomiting, fevers, chills, and sweats, urinary symptoms, pelvic pain, vaginal discharge, etc.  She denies hematochezia and melena. She has a history of colonic polyps (last colonoscopy 03/02/19), presently scheduled for a follow-up colonoscopy on 12/31/22.  She has no history of kidney stones.  She reports that she had negative pap smear one month ago.  Other surgical history includes panniculectomy approximately one year ago. Review of most recent lab results (08/19/22):  CBC, CMP, and TSH normal.  Lipid panel:  elevated chol, trig, and LDL>  The history is provided by the patient.  Abdominal Pain Pain location:  LLQ Pain quality: aching and dull   Pain radiates to:  L flank Pain severity:  Mild Onset quality:  Gradual Duration:  3 weeks Timing:  Intermittent Progression:  Worsening Chronicity:  New Context: eating   Context: not diet changes, not recent illness, not recent travel and not suspicious food intake   Relieved by:  Nothing Worsened by:  Eating Ineffective treatments:  None tried Associated symptoms: diarrhea and nausea   Associated symptoms: no anorexia, no belching, no chest pain, no chills, no constipation, no cough, no dysuria, no fatigue, no  fever, no hematemesis, no hematochezia, no hematuria, no melena, no shortness of breath, no vaginal bleeding, no vaginal discharge and no vomiting     Past Medical History:  Diagnosis Date   Anemia    Anxiety    Arthritis    Left Foot   Back pain    Chronic fatigue syndrome    Gallbladder problem    High cholesterol    Hyperlipidemia    Hypertension    Hypothyroidism    IBS (irritable bowel syndrome)    Insulin resistance    Joint pain    Low vitamin D level    Obesity (BMI 30-39.9)    Palpitations    PCOS (polycystic ovarian syndrome)    PVC's (premature ventricular contractions)    Vitamin D deficiency     Patient Active Problem List   Diagnosis Date Noted   Primary insomnia 08/19/2022   Preoperative clearance 01/29/2022   Protrusion of lumbar intervertebral disc 06/20/2021   Acute right-sided low back pain with right-sided sciatica 06/19/2021   Bilateral hearing loss 03/30/2021   Tinnitus of both ears 03/30/2021   Diarrhea 03/14/2021   Left lower quadrant abdominal pain 03/14/2021   Uterine fibroid 01/02/2021   At risk for activity intolerance 01/01/2021   Class 1 obesity due to excess calories with serious comorbidity and body mass index (BMI) of 30.0 to 30.9 in adult 12/27/2020   Acne vulgaris 12/27/2020   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/13/2020   Metabolic syndrome 12/13/2020   At risk for side effect of medication  12/13/2020   COVID-19 virus infection 11/02/2019   Left foot pain 09/29/2019   Depressed mood 10/27/2018   Insulin resistance 06/21/2018   Prediabetes 02/13/2018   Colon cancer high risk 02/13/2018   Vitamin D deficiency 09/14/2017   Dyslipidemia (high LDL; low HDL) 01/13/2017   Drug-induced constipation 11/18/2016   Obesity (BMI 35.0-39.9 without comorbidity) 09/19/2016   Chronic fatigue syndrome 09/17/2016   Mass in neck 05/21/2016   Uveitis, intermediate 02/29/2016   Right thyroid nodule 01/29/2016   Family history of breast cancer  12/12/2015   Family history of melanoma 12/12/2015   OSA (obstructive sleep apnea) 04/03/2015   Other fatigue 09/19/2014   Dry skin 09/19/2014   PVC's (premature ventricular contractions) 05/31/2014   Hyperlipidemia 01/31/2014   Sinus tachycardia 01/26/2014   Breast mass, right 01/19/2014   PALPITATIONS 04/26/2008   Bilateral lower abdominal cramping 11/04/2007   LUMBAR STRAIN 06/10/2007   Anxiety state 06/08/2007   Essential hypertension 06/08/2007    Past Surgical History:  Procedure Laterality Date   BREAST REDUCTION SURGERY Bilateral    CHOLECYSTECTOMY     DILATATION & CURETTAGE/HYSTEROSCOPY WITH MYOSURE N/A 02/21/2021   Procedure: DILATATION & CURETTAGE/HYSTEROSCOPY WITH MYOSURE;  Surgeon: [name], MD;  Location: [facility];  Service: Gynecology;  Laterality: N/A;    OB History     Gravida  5   Para  4   Term  4   Preterm      AB  1   Living  4      SAB      IAB  1   Ectopic      Multiple      Live Births               Home Medications    Prior to Admission medications   Medication Sig Start Date End Date Taking? Authorizing Provider  ALPRAZolam Prudy Feeler) 0.5 MG tablet TAKE 1 TABLET BY MOUTH TWICE A DAY AS NEEDED FOR ANXIETY 11/04/22   Breeback, Jade L, PA-C  cholestyramine (QUESTRAN) 4 g packet Take one pack PO once daily.  Mix with fluid and take with a meal 12/20/22  Yes Jaivyn Gulla, Tera Mater, MD  Cholecalciferol (VITAMIN D) 125 MCG (5000 UT) CAPS Take 5,000 Units by mouth daily.     [provider]  cyclobenzaprine (FLEXERIL) 10 MG tablet Take 1 tablet (10 mg total) by mouth 3 (three) times daily as needed for muscle spasms. Patient not taking: Reported on 10/15/2022 08/19/22   Jomarie Longs, PA-C  metFORMIN (GLUCOPHAGE) 500 MG tablet Take 1 tablet (500 mg total) by mouth 2 (two) times daily with a meal. 08/19/22   Breeback, Jade L, PA-C  nebivolol (BYSTOLIC) 5 MG tablet TAKE 1 TABLET DAILY (APPOINTMENT FOR FURTHER REFILLS)  09/16/22   Tandy Gaw L, PA-C  sertraline (ZOLOFT) 50 MG tablet Take 1 tablet (50 mg total) by mouth daily. 08/19/22   Breeback, Jade L, PA-C  tirzepatide [facility]) 12.5 MG/0.5ML Pen Inject 12.5 mg into the skin once a week. 08/19/22   Breeback, Lonna Cobb, PA-C  traZODone (DESYREL) 50 MG tablet TAKE 0.5-1 TABLETS BY MOUTH AT BEDTIME AS NEEDED FOR SLEEP. 11/04/22   Breeback, Jade L, PA-C  tretinoin (RETIN-A) 0.1 % cream APPLY TO AFFECTED AREA EVERY DAY AT BEDTIME 11/04/22   Jomarie Longs, PA-C    Family History Family History  Problem Relation Age of Onset   Hyperlipidemia Mother    Hypertension Mother    Cancer  Mother        breast cancer invasive/pancreatic cancer   Pancreatic cancer Mother    Pulmonary fibrosis Mother    Anxiety disorder Mother    Obesity Mother    Cancer Father    Alcohol abuse Father    Alcoholism Father    Cancer Sister        melanoma   Alcohol abuse Other        family hx   Arthritis Other        family hx   Hypertension Other        family hx   Stroke Other        <50 family hx   Colitis Other     Social History Social History   Tobacco Use   Smoking status: Former   Smokeless tobacco: Never   Tobacco comments:    quit April 19, 2014  Vaping Use   Vaping Use: Never used  Substance Use Topics   Alcohol use: Yes    Alcohol/week: 0.0 standard drinks of alcohol    Comment: Occasional   Drug use: No     Allergies   Patient has no known allergies.   Review of Systems Review of Systems  Constitutional:  Negative for activity change, chills, diaphoresis, fatigue and fever.  Respiratory:  Negative for cough and shortness of breath.   Cardiovascular:  Negative for chest pain.  Gastrointestinal:  Positive for abdominal pain, diarrhea and nausea. Negative for anorexia, blood in stool, constipation, hematemesis, hematochezia, melena and vomiting.  Genitourinary:  Negative for dysuria, frequency, hematuria, pelvic pain, urgency, vaginal  bleeding and vaginal discharge.  Musculoskeletal:  Negative for myalgias.  Skin:  Negative for rash.  All other systems reviewed and are negative.    Physical Exam Triage Vital Signs ED Triage Vitals  Enc Vitals Group     BP 12/20/22 1425 105/72     Pulse Rate 12/20/22 1425 84     Resp 12/20/22 1425 17     Temp 12/20/22 1425 97.7 F (36.5 C)     Temp Source 12/20/22 1425 Oral     SpO2 12/20/22 1425 96 %     Weight --      Height --      Head Circumference --      Peak Flow --      Pain Score 12/20/22 1426 3     Pain Loc --      Pain Edu? --      Excl. in GC? --    No data found.  Updated Vital Signs BP 105/72 (BP Location: Right Arm)   Pulse 84   Temp 97.7 F (36.5 C) (Oral)   Resp 17   SpO2 96%   Visual Acuity Right Eye Distance:   Left Eye Distance:   Bilateral Distance:    Right Eye Near:   Left Eye Near:    Bilateral Near:     Physical Exam Constitutional:      General: She is not in acute distress.    Appearance: She is obese. She is not ill-appearing.  HENT:     Head: Normocephalic.     Mouth/Throat:     Mouth: Mucous membranes are moist.     Pharynx: Oropharynx is clear.  Eyes:     Conjunctiva/sclera: Conjunctivae normal.     Pupils: Pupils are equal, round, and reactive to light.  Cardiovascular:     Rate and Rhythm: Normal rate and regular rhythm.  Heart sounds: Normal heart sounds.  Pulmonary:     Breath sounds: Normal breath sounds.  Abdominal:     General: A surgical scar is present. Bowel sounds are normal. There is no distension.     Palpations: Abdomen is soft. There is no hepatomegaly, splenomegaly or mass.     Tenderness: There is abdominal tenderness in the left lower quadrant. There is no right CVA tenderness, left CVA tenderness, guarding or rebound. Negative signs include psoas sign and obturator sign.     Hernia: There is no hernia in the umbilical area or ventral area.    Musculoskeletal:     Cervical back: Neck supple.      Right lower leg: No edema.     Left lower leg: No edema.  Lymphadenopathy:     Cervical: No cervical adenopathy.  Skin:    General: Skin is warm and dry.     Findings: No rash.  Neurological:     General: No focal deficit present.     Mental Status: She is alert.      UC Treatments / Results  Labs (all labs ordered are listed, but only abnormal results are displayed) Labs Reviewed  POCT URINALYSIS DIP (MANUAL ENTRY) - Abnormal; Notable for the following components:      Result Value   Ketones, POC UA trace (5) (*)    All other components within normal limits    EKG   Radiology CT ABDOMEN PELVIS W CONTRAST  Result Date: 12/20/2022 CLINICAL DATA:  Left lower quadrant abdominal pain for a few weeks, initially intermittent but now constant with diarrhea. EXAM: CT ABDOMEN AND PELVIS WITH CONTRAST TECHNIQUE: Multidetector CT imaging of the abdomen and pelvis was performed using the standard protocol following bolus administration of intravenous contrast. RADIATION DOSE REDUCTION: This exam was performed according to the departmental dose-optimization program which includes automated exposure control, adjustment of the mA and/or kV according to patient size and/or use of iterative reconstruction technique. CONTRAST:  OMNIPAQUE IOHEXOL 300 MG/ML  SOLN COMPARISON:  CT abdomen and pelvis 03/14/2021 FINDINGS: Lower chest: Clear lung bases. Hepatobiliary: No focal liver abnormality is seen. Status post cholecystectomy. No biliary dilatation. Pancreas: Unremarkable. Spleen: Unremarkable. Adrenals/Urinary Tract: Unremarkable adrenal glands. No evidence of a renal mass, calculi, or hydronephrosis. Unremarkable bladder. Stomach/Bowel: The stomach is unremarkable. There is no evidence of bowel obstruction or inflammation. The appendix is unremarkable. Vascular/Lymphatic: Normal caliber of the abdominal aorta. No enlarged lymph nodes. Reproductive: Endometrial thickness of 1 cm, similar to the  prior study. Small fibroid in the left aspect of the uterus. Multiple nabothian cysts in the cervix as previously noted. No adnexal mass. Other: No ascites or pneumoperitoneum. Musculoskeletal: Chronic bilateral L5 pars defects with trace if any anterolisthesis. Asymmetrically advanced right-sided disc degeneration at L4-5. IMPRESSION: No acute abnormality identified in the abdomen or pelvis. Electronically Signed   By: Sebastian Ache M.D.   On: 12/20/2022 16:15    Procedures Procedures (including critical care time)  Medications Ordered in UC Medications - No data to display  Initial Impression / Assessment and Plan / UC Course  I have reviewed the triage vital signs and the nursing notes.  Pertinent labs & imaging results that were available during my care of the patient were reviewed by me and considered in my medical decision making (see chart for details).    Benign exam and CT abdomen/pelvis. U/A negative except trace ketones.   ? Irritable bowel pain exacerbated by bile induced diarrhea. Begin trial  of Questran. Followup with GI.  Final Clinical Impressions(s) / UC Diagnoses   Final diagnoses:  Left lower quadrant abdominal pain  Bile salt-induced diarrhea     Discharge Instructions      If symptoms become significantly worse during the night or over the weekend, proceed to the local emergency room.      ED Prescriptions     Medication Sig Dispense Auth. Provider   cholestyramine (QUESTRAN) 4 g packet Take one pack PO once daily.  Mix with fluid and take with a meal 30 each Lattie Haw, MD         Lattie Haw, MD 12/21/22 [phone]

## 2022-12-20 NOTE — Discharge Instructions (Signed)
If symptoms become significantly worse during the night or over the weekend, proceed to the local emergency room.  

## 2022-12-20 NOTE — ED Triage Notes (Addendum)
Pt c/o lt sided abd pain. Pain is intermittent, dull ache. Worsening in last week. Sometimes travels towards around towards her back. Notices its sometimes worse after eating. Does not have gallbladder. No hx of kidney stones.

## 2022-12-30 ENCOUNTER — Telehealth: Payer: Self-pay

## 2022-12-30 NOTE — Telephone Encounter (Signed)
Per Dr Cathren Harsh, call made to pt to check on status since UC visit. Pt states recommendation helped and has GI f/u scheduled. Advised to call if any questions or concerns.

## 2022-12-31 LAB — HM COLONOSCOPY

## 2023-01-22 ENCOUNTER — Ambulatory Visit: Payer: No Typology Code available for payment source | Admitting: Physician Assistant

## 2023-01-29 ENCOUNTER — Other Ambulatory Visit: Payer: Self-pay | Admitting: Physician Assistant

## 2023-01-29 DIAGNOSIS — F5101 Primary insomnia: Secondary | ICD-10-CM

## 2023-02-17 ENCOUNTER — Ambulatory Visit: Payer: No Typology Code available for payment source | Admitting: Physician Assistant

## 2023-02-21 ENCOUNTER — Ambulatory Visit: Payer: No Typology Code available for payment source | Admitting: Physician Assistant

## 2023-02-21 VITALS — BP 105/67 | HR 77 | Ht 66.0 in | Wt 194.0 lb

## 2023-02-21 DIAGNOSIS — R7303 Prediabetes: Secondary | ICD-10-CM | POA: Diagnosis not present

## 2023-02-21 DIAGNOSIS — F411 Generalized anxiety disorder: Secondary | ICD-10-CM

## 2023-02-21 DIAGNOSIS — M5441 Lumbago with sciatica, right side: Secondary | ICD-10-CM

## 2023-02-21 DIAGNOSIS — I10 Essential (primary) hypertension: Secondary | ICD-10-CM | POA: Diagnosis not present

## 2023-02-21 DIAGNOSIS — E782 Mixed hyperlipidemia: Secondary | ICD-10-CM

## 2023-02-21 DIAGNOSIS — F5101 Primary insomnia: Secondary | ICD-10-CM

## 2023-02-21 DIAGNOSIS — Z6831 Body mass index (BMI) 31.0-31.9, adult: Secondary | ICD-10-CM

## 2023-02-21 DIAGNOSIS — E6609 Other obesity due to excess calories: Secondary | ICD-10-CM

## 2023-02-21 DIAGNOSIS — K760 Fatty (change of) liver, not elsewhere classified: Secondary | ICD-10-CM

## 2023-02-21 DIAGNOSIS — I493 Ventricular premature depolarization: Secondary | ICD-10-CM

## 2023-02-21 MED ORDER — NEBIVOLOL HCL 5 MG PO TABS
5.0000 mg | ORAL_TABLET | Freq: Every day | ORAL | 1 refills | Status: DC
Start: 2023-02-21 — End: 2023-03-14

## 2023-02-21 MED ORDER — ZOLPIDEM TARTRATE 5 MG PO TABS
5.0000 mg | ORAL_TABLET | Freq: Every evening | ORAL | 5 refills | Status: DC | PRN
Start: 2023-02-21 — End: 2023-07-29

## 2023-02-21 MED ORDER — METFORMIN HCL 500 MG PO TABS
500.0000 mg | ORAL_TABLET | Freq: Two times a day (BID) | ORAL | 1 refills | Status: DC
Start: 2023-02-21 — End: 2023-07-29

## 2023-02-21 MED ORDER — MOUNJARO 12.5 MG/0.5ML ~~LOC~~ SOAJ
12.5000 mg | SUBCUTANEOUS | 1 refills | Status: DC
Start: 2023-02-21 — End: 2023-12-26

## 2023-02-21 NOTE — Progress Notes (Signed)
Established Patient Office Visit  Subjective   Patient ID: [name], female    DOB: 08-23-1970  Age: 52 y.o. MRN: [id number]  Chief Complaint  Patient presents with   Hypertension    HPI Pt is a 52 yo obese female with HTN, PVC, HLD, pre-diabetes, anxiety, insomnia who presents to the clinic for follow up.   Pt is doing well and has no concerns. Denies any palpitations, headaches, vision changes. Taking her medications. Not checking BP at home.   Greggory Keen is helping her keep weight stable. Minimal constipation. Not checking sugars.    Mood and anxiety doing well. No concerns with zoloft. Trazodone not working at all and increased to 100mg . Would like to try something else for sleep.   .. Active Ambulatory Problems    Diagnosis Date Noted   Anxiety state 06/08/2007   Essential hypertension 06/08/2007   PALPITATIONS 04/26/2008   Bilateral lower abdominal cramping 11/04/2007   LUMBAR STRAIN 06/10/2007   Breast mass, right 01/19/2014   Sinus tachycardia 01/26/2014   Hyperlipidemia 01/31/2014   PVC's (premature ventricular contractions) 05/31/2014   Other fatigue 09/19/2014   Dry skin 09/19/2014   OSA (obstructive sleep apnea) 04/03/2015   Family history of breast cancer 12/12/2015   Family history of melanoma 12/12/2015   Right thyroid nodule 01/29/2016   Uveitis, intermediate 02/29/2016   Mass in neck 05/21/2016   Chronic fatigue syndrome 09/17/2016   Obesity (BMI 35.0-39.9 without comorbidity) 09/19/2016   Drug-induced constipation 11/18/2016   Dyslipidemia (high LDL; low HDL) 01/13/2017   Vitamin D deficiency 09/14/2017   Prediabetes 02/13/2018   Colon cancer high risk 02/13/2018   Insulin resistance 06/21/2018   Depressed mood 10/27/2018   Left foot pain 09/29/2019   COVID-19 virus infection 11/02/2019   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/13/2020   Metabolic syndrome 12/13/2020   At risk for side effect of medication 12/13/2020   Class 1 obesity due to  excess calories without serious comorbidity with body mass index (BMI) of 31.0 to 31.9 in adult 12/27/2020   Acne vulgaris 12/27/2020   At risk for activity intolerance 01/01/2021   Uterine fibroid 01/02/2021   Diarrhea 03/14/2021   Left lower quadrant abdominal pain 03/14/2021   Bilateral hearing loss 03/30/2021   Tinnitus of both ears 03/30/2021   Acute right-sided low back pain with right-sided sciatica 06/19/2021   Protrusion of lumbar intervertebral disc 06/20/2021   Preoperative clearance 01/29/2022   Primary insomnia 08/19/2022   Resolved Ambulatory Problems    Diagnosis Date Noted   Weight gain 09/19/2014   DUB (dysfunctional uterine bleeding) 06/28/2015   Redness of left eye 12/12/2015   Pelvic pain 02/13/2018   Class 1 obesity with serious comorbidity and body mass index (BMI) of 34.0 to 34.9 in adult 10/27/2018   Menorrhagia with irregular cycle 12/26/2020   Thickened endometrium 01/02/2021   Irregular bleeding    Past Medical History:  Diagnosis Date   Anemia    Anxiety    Arthritis    Back pain    Gallbladder problem    High cholesterol    Hypertension    Hypothyroidism    IBS (irritable bowel syndrome)    Joint pain    Low vitamin D level    Obesity (BMI 30-39.9)    PCOS (polycystic ovarian syndrome)      ROS See HPI.    Objective:     BP 105/67 (BP Location: Right Arm, Patient Position: Sitting, Cuff Size: Normal)   Pulse 77  Ht 5\' 6"  (1.676 m)   Wt 194 lb (88 kg)   SpO2 97%   BMI 31.31 kg/m  BP Readings from Last 3 Encounters:  02/21/23 105/67  12/20/22 105/72  10/15/22 116/79   Wt Readings from Last 3 Encounters:  02/21/23 194 lb (88 kg)  10/15/22 195 lb 9.6 oz (88.7 kg)  08/19/22 193 lb (87.5 kg)    ..    10/15/2022    1:57 PM 08/19/2022   11:24 AM 01/25/2022    4:01 PM 12/26/2020    4:01 PM 04/05/2020    8:26 AM  Depression screen PHQ 2/9  Decreased Interest 0 0 0 0 2  Down, Depressed, Hopeless 0 0 0 0 2  PHQ - 2 Score 0 0 0 0 4   Altered sleeping  3  1 3   Tired, decreased energy  2  1 3   Change in appetite  0  0 3  Feeling bad or failure about yourself   0  0 2  Trouble concentrating  0  0 1  Moving slowly or fidgety/restless  0  0 1  Suicidal thoughts  0  0 0  PHQ-9 Score  5  2 17   Difficult doing work/chores  Somewhat difficult  Not difficult at all Somewhat difficult   ..    08/19/2022   11:25 AM 12/26/2020    4:01 PM 09/29/2019    2:06 PM 08/02/2019   10:37 AM  GAD 7 : Generalized Anxiety Score  Nervous, Anxious, on Edge 0 1 0 0  Control/stop worrying 0 1 0 0  Worry too much - different things 0 0 0 0  Trouble relaxing 1 0 1 0  Restless 0 0 1 0  Easily annoyed or irritable 0 1 1 0  Afraid - awful might happen 0 0 0 0  Total GAD 7 Score 1 3 3  0  Anxiety Difficulty Not difficult at all Not difficult at all Not difficult at all Not difficult at all      Physical Exam Constitutional:      Appearance: Normal appearance. She is obese.  HENT:     Head: Normocephalic.  Neck:     Vascular: No carotid bruit.  Cardiovascular:     Rate and Rhythm: Normal rate and regular rhythm.  Pulmonary:     Effort: Pulmonary effort is normal.     Breath sounds: Normal breath sounds.  Musculoskeletal:     Cervical back: Normal range of motion and neck supple. No rigidity or tenderness.     Right lower leg: No edema.     Left lower leg: No edema.  Lymphadenopathy:     Cervical: No cervical adenopathy.  Neurological:     General: No focal deficit present.     Mental Status: She is alert and oriented to person, place, and time.  Psychiatric:        Mood and Affect: Mood normal.       The 10-year ASCVD risk score (Arnett DK, et al., 2019) is: 1.7%    Assessment & Plan:  Marland KitchenMarland KitchenHailley was seen today for hypertension.  Diagnoses and all orders for this visit:  Primary insomnia -     zolpidem (AMBIEN) 5 MG tablet; Take 1 tablet (5 mg total) by mouth at bedtime as needed for sleep. -     COMPLETE METABOLIC  PANEL WITH GFR -     TSH  Essential hypertension -     tirzepatide (MOUNJARO) 12.5 MG/0.5ML Pen; Inject 12.5  mg into the skin once a week. -     COMPLETE METABOLIC PANEL WITH GFR -     nebivolol (BYSTOLIC) 5 MG tablet; Take 1 tablet (5 mg total) by mouth daily.  Class 1 obesity due to excess calories without serious comorbidity with body mass index (BMI) of 31.0 to 31.9 in adult -     tirzepatide (MOUNJARO) 12.5 MG/0.5ML Pen; Inject 12.5 mg into the skin once a week. -     COMPLETE METABOLIC PANEL WITH GFR  Prediabetes -     tirzepatide (MOUNJARO) 12.5 MG/0.5ML Pen; Inject 12.5 mg into the skin once a week. -     metFORMIN (GLUCOPHAGE) 500 MG tablet; Take 1 tablet (500 mg total) by mouth 2 (two) times daily with a meal. -     COMPLETE METABOLIC PANEL WITH GFR  Nonalcoholic hepatosteatosis -     tirzepatide (MOUNJARO) 12.5 MG/0.5ML Pen; Inject 12.5 mg into the skin once a week. -     metFORMIN (GLUCOPHAGE) 500 MG tablet; Take 1 tablet (500 mg total) by mouth 2 (two) times daily with a meal. -     COMPLETE METABOLIC PANEL WITH GFR  PVC's (premature ventricular contractions) -     tirzepatide (MOUNJARO) 12.5 MG/0.5ML Pen; Inject 12.5 mg into the skin once a week. -     nebivolol (BYSTOLIC) 5 MG tablet; Take 1 tablet (5 mg total) by mouth daily. -     TSH  Mixed hyperlipidemia -     tirzepatide (MOUNJARO) 12.5 MG/0.5ML Pen; Inject 12.5 mg into the skin once a week. -     Lipid Panel w/reflex Direct LDL   Weight is staying fairly stable right now Just in obesity range Continue on mounjaro 12.5mg  and metformin Continue to work on regular exercise and healthy diet CMP ordered Vitals controlled and refilled bystolic LDL to follow up on hyperlipidemia, needs to be fasting Stop trazodone, trial of ambien Mood good, refilled zoloft  Reports colonoscopy at [facility], will get copy.       Return in about 6 months (around 08/24/2023) for weight/BP.    Tandy Gaw,  PA-C

## 2023-02-24 ENCOUNTER — Encounter: Payer: Self-pay | Admitting: Physician Assistant

## 2023-02-24 MED ORDER — CYCLOBENZAPRINE HCL 10 MG PO TABS
10.0000 mg | ORAL_TABLET | Freq: Three times a day (TID) | ORAL | 1 refills | Status: AC | PRN
Start: 2023-02-24 — End: ?

## 2023-02-24 MED ORDER — SERTRALINE HCL 50 MG PO TABS
50.0000 mg | ORAL_TABLET | Freq: Every day | ORAL | 1 refills | Status: DC
Start: 2023-02-24 — End: 2023-07-29

## 2023-03-14 ENCOUNTER — Other Ambulatory Visit: Payer: Self-pay | Admitting: Physician Assistant

## 2023-03-14 DIAGNOSIS — I10 Essential (primary) hypertension: Secondary | ICD-10-CM

## 2023-03-14 DIAGNOSIS — I493 Ventricular premature depolarization: Secondary | ICD-10-CM

## 2023-03-25 ENCOUNTER — Other Ambulatory Visit: Payer: Self-pay | Admitting: Physician Assistant

## 2023-03-25 DIAGNOSIS — F411 Generalized anxiety disorder: Secondary | ICD-10-CM

## 2023-03-26 ENCOUNTER — Ambulatory Visit: Payer: No Typology Code available for payment source

## 2023-04-02 ENCOUNTER — Encounter: Payer: Self-pay | Admitting: Physician Assistant

## 2023-04-02 MED ORDER — MOUNJARO 10 MG/0.5ML ~~LOC~~ SOAJ: 10.0000 mg | SUBCUTANEOUS | 0 refills | Status: DC

## 2023-06-07 ENCOUNTER — Other Ambulatory Visit: Payer: Self-pay | Admitting: Physician Assistant

## 2023-06-25 ENCOUNTER — Other Ambulatory Visit: Payer: Self-pay | Admitting: Physician Assistant

## 2023-07-11 ENCOUNTER — Other Ambulatory Visit: Payer: Self-pay | Admitting: Physician Assistant

## 2023-07-11 DIAGNOSIS — F411 Generalized anxiety disorder: Secondary | ICD-10-CM

## 2023-07-28 ENCOUNTER — Ambulatory Visit: Payer: No Typology Code available for payment source | Admitting: Physician Assistant

## 2023-07-28 ENCOUNTER — Encounter: Payer: Self-pay | Admitting: Physician Assistant

## 2023-07-28 VITALS — BP 135/87 | HR 81 | Resp 12 | Ht 66.0 in | Wt 190.7 lb

## 2023-07-28 DIAGNOSIS — Z01818 Encounter for other preprocedural examination: Secondary | ICD-10-CM | POA: Diagnosis not present

## 2023-07-28 DIAGNOSIS — Z79899 Other long term (current) drug therapy: Secondary | ICD-10-CM

## 2023-07-28 DIAGNOSIS — R7303 Prediabetes: Secondary | ICD-10-CM

## 2023-07-28 DIAGNOSIS — N951 Menopausal and female climacteric states: Secondary | ICD-10-CM

## 2023-07-28 DIAGNOSIS — I10 Essential (primary) hypertension: Secondary | ICD-10-CM

## 2023-07-28 DIAGNOSIS — F411 Generalized anxiety disorder: Secondary | ICD-10-CM

## 2023-07-28 DIAGNOSIS — Z7984 Long term (current) use of oral hypoglycemic drugs: Secondary | ICD-10-CM

## 2023-07-28 DIAGNOSIS — K760 Fatty (change of) liver, not elsewhere classified: Secondary | ICD-10-CM

## 2023-07-28 DIAGNOSIS — E782 Mixed hyperlipidemia: Secondary | ICD-10-CM

## 2023-07-28 DIAGNOSIS — I493 Ventricular premature depolarization: Secondary | ICD-10-CM

## 2023-07-28 DIAGNOSIS — F5101 Primary insomnia: Secondary | ICD-10-CM

## 2023-07-28 DIAGNOSIS — E785 Hyperlipidemia, unspecified: Secondary | ICD-10-CM

## 2023-07-28 DIAGNOSIS — E66811 Obesity, class 1: Secondary | ICD-10-CM

## 2023-07-28 LAB — POCT GLYCOSYLATED HEMOGLOBIN (HGB A1C): Hemoglobin A1C: 5.4 % (ref 4.0–5.6)

## 2023-07-28 MED ORDER — VEOZAH 45 MG PO TABS: 1.0000 | ORAL_TABLET | Freq: Every day | ORAL | 11 refills | Status: AC

## 2023-07-28 NOTE — Progress Notes (Signed)
e  Established Patient Office Visit  Subjective   Patient ID: [name], female    DOB: 11-09-1970  Age: 52 y.o. MRN: [id number]  Chief Complaint  Patient presents with   pre op elective surgery    HPI Pt is a 52 yo female who presents to the clinic for pre surgical clearance for neck lift.   Pt is doing well. Her mood is controlled. She continues to lose weight on mounjaro. She is exercising and eating well. She does need PA done on mounjaro.   She is having lots of hot flashes with menopause and wants to know if there is anything she can do other than HRT.   Needs ambien, zoloft refilled.   .. Active Ambulatory Problems    Diagnosis Date Noted   Anxiety state 06/08/2007   Essential hypertension 06/08/2007   PALPITATIONS 04/26/2008   Bilateral lower abdominal cramping 11/04/2007   LUMBAR STRAIN 06/10/2007   Breast mass, right 01/19/2014   Sinus tachycardia 01/26/2014   Hyperlipidemia 01/31/2014   PVC's (premature ventricular contractions) 05/31/2014   Other fatigue 09/19/2014   Dry skin 09/19/2014   OSA (obstructive sleep apnea) 04/03/2015   Family history of breast cancer 12/12/2015   Family history of melanoma 12/12/2015   Right thyroid nodule 01/29/2016   Uveitis, intermediate 02/29/2016   Mass in neck 05/21/2016   Chronic fatigue syndrome 09/17/2016   Obesity (BMI 35.0-39.9 without comorbidity) 09/19/2016   Drug-induced constipation 11/18/2016   Dyslipidemia (high LDL; low HDL) 01/13/2017   Vitamin D deficiency 09/14/2017   Prediabetes 02/13/2018   Colon cancer high risk 02/13/2018   Insulin resistance 06/21/2018   Depressed mood 10/27/2018   Left foot pain 09/29/2019   COVID-19 virus infection 11/02/2019   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/13/2020   Metabolic syndrome 12/13/2020   At risk for side effect of medication 12/13/2020   Class 1 obesity due to excess calories without serious comorbidity with body mass index (BMI) of 31.0 to 31.9 in adult  12/27/2020   Acne vulgaris 12/27/2020   At risk for activity intolerance 01/01/2021   Uterine fibroid 01/02/2021   Diarrhea 03/14/2021   Left lower quadrant abdominal pain 03/14/2021   Bilateral hearing loss 03/30/2021   Tinnitus of both ears 03/30/2021   Acute right-sided low back pain with right-sided sciatica 06/19/2021   Protrusion of lumbar intervertebral disc 06/20/2021   Preoperative clearance 01/29/2022   Primary insomnia 08/19/2022   Menopausal hot flushes 07/29/2023   Resolved Ambulatory Problems    Diagnosis Date Noted   Weight gain 09/19/2014   DUB (dysfunctional uterine bleeding) 06/28/2015   Redness of left eye 12/12/2015   Pelvic pain 02/13/2018   Class 1 obesity with serious comorbidity and body mass index (BMI) of 34.0 to 34.9 in adult 10/27/2018   Menorrhagia with irregular cycle 12/26/2020   Thickened endometrium 01/02/2021   Irregular bleeding    Past Medical History:  Diagnosis Date   Anemia    Anxiety    Arthritis    Back pain    Gallbladder problem    High cholesterol    Hypertension    Hypothyroidism    IBS (irritable bowel syndrome)    Joint pain    Low vitamin D level    Obesity (BMI 30-39.9)    PCOS (polycystic ovarian syndrome)        Review of Systems  All other systems reviewed and are negative.     Objective:     BP 135/87 (BP Location: Right Arm,  Patient Position: Sitting)   Pulse 81   Resp 12   Ht 5\' 6"  (1.676 m)   Wt 190 lb 11.2 oz (86.5 kg)   SpO2 92%   BMI 30.78 kg/m  BP Readings from Last 3 Encounters:  07/29/23 135/87  02/21/23 105/67  12/20/22 105/72   Wt Readings from Last 3 Encounters:  07/29/23 190 lb 11.2 oz (86.5 kg)  02/21/23 194 lb (88 kg)  10/15/22 195 lb 9.6 oz (88.7 kg)      Physical Exam Constitutional:      Appearance: Normal appearance.  HENT:     Head: Normocephalic.  Cardiovascular:     Rate and Rhythm: Normal rate and regular rhythm.  Pulmonary:     Effort: Pulmonary effort is  normal.     Breath sounds: Normal breath sounds.  Musculoskeletal:     Cervical back: Normal range of motion and neck supple. No tenderness.     Right lower leg: No edema.     Left lower leg: No edema.  Lymphadenopathy:     Cervical: No cervical adenopathy.  Neurological:     General: No focal deficit present.     Mental Status: She is alert and oriented to person, place, and time.  Psychiatric:        Mood and Affect: Mood normal.     EKG-NSR with no ST elevation or depression.    The 10-year ASCVD risk score (Arnett DK, et al., 2019) is: 3%    Assessment & Plan:  Marland KitchenMarland KitchenTalula was seen today for pre op elective surgery.  Diagnoses and all orders for this visit:  Preop examination -     CBC w/Diff/Platelet -     CMP14+EGFR -     TSH -     EKG 12-Lead -     POCT HgB A1C -     Lipid panel  Mixed hyperlipidemia -     Lipid panel  Menopausal hot flushes -     Fezolinetant (VEOZAH) 45 MG TABS; Take 1 tablet (45 mg total) by mouth daily.  Medication management -     Hepatic function panel; Future  Essential hypertension  PVC's (premature ventricular contractions)  Prediabetes -     metFORMIN (GLUCOPHAGE) 500 MG tablet; Take 1 tablet (500 mg total) by mouth 2 (two) times daily with a meal.  Dyslipidemia (high LDL; low HDL)  Class 1 obesity due to excess calories without serious comorbidity with body mass index (BMI) of 30.0 to 30.9 in adult  Primary insomnia -     zolpidem (AMBIEN) 5 MG tablet; Take 1 tablet (5 mg total) by mouth at bedtime as needed for sleep.  Nonalcoholic hepatosteatosis -     metFORMIN (GLUCOPHAGE) 500 MG tablet; Take 1 tablet (500 mg total) by mouth 2 (two) times daily with a meal.  Anxiety state -     sertraline (ZOLOFT) 50 MG tablet; Take 1 tablet (50 mg total) by mouth daily.    TSH, CBC, CMP, A1C ordered in office for pre-surgical clearance EKG NSR with no acute abnormalities.  Will fax once we get labs  Added lipid due to need.    Discussed hot flashes and Veozah decided to try Recheck in 1 month liver enzymes, standing order printed Follow up in 3 months.   Continue on mounjaro 10mg  weekly for weight management and CV risk reduction.  Lost 4 more lbs.  Sent note to nurse for PA to be done.    Pt declined vaccines today.  Tandy Gaw, PA-C

## 2023-07-29 ENCOUNTER — Encounter: Payer: Self-pay | Admitting: Physician Assistant

## 2023-07-29 DIAGNOSIS — N951 Menopausal and female climacteric states: Secondary | ICD-10-CM | POA: Insufficient documentation

## 2023-07-29 LAB — CMP14+EGFR
ALT: 20 [IU]/L (ref 0–32)
AST: 19 [IU]/L (ref 0–40)
Albumin: 4.6 g/dL (ref 3.8–4.9)
Alkaline Phosphatase: 87 [IU]/L (ref 44–121)
BUN/Creatinine Ratio: 15 (ref 9–23)
BUN: 10 mg/dL (ref 6–24)
Bilirubin Total: 0.4 mg/dL (ref 0.0–1.2)
CO2: 18 mmol/L — ABNORMAL LOW (ref 20–29)
Calcium: 9.6 mg/dL (ref 8.7–10.2)
Chloride: 105 mmol/L (ref 96–106)
Creatinine, Ser: 0.67 mg/dL (ref 0.57–1.00)
Globulin, Total: 2.5 g/dL (ref 1.5–4.5)
Glucose: 99 mg/dL (ref 70–99)
Potassium: 4.6 mmol/L (ref 3.5–5.2)
Sodium: 141 mmol/L (ref 134–144)
Total Protein: 7.1 g/dL (ref 6.0–8.5)
eGFR: 104 mL/min/{1.73_m2} (ref >59)

## 2023-07-29 LAB — CBC WITH DIFFERENTIAL/PLATELET
Basophils Absolute: 0.1 10*3/uL (ref 0.0–0.2)
Basos: 1 %
EOS (ABSOLUTE): 0.3 10*3/uL (ref 0.0–0.4)
Eos: 3 %
Hematocrit: 44.5 % (ref 34.0–46.6)
Hemoglobin: 15.1 g/dL (ref 11.1–15.9)
Immature Grans (Abs): 0 10*3/uL (ref 0.0–0.1)
Immature Granulocytes: 0 %
Lymphocytes Absolute: 2.7 10*3/uL (ref 0.7–3.1)
Lymphs: 30 %
MCH: 31.9 pg (ref 26.6–33.0)
MCHC: 33.9 g/dL (ref 31.5–35.7)
MCV: 94 fL (ref 79–97)
Monocytes Absolute: 0.5 10*3/uL (ref 0.1–0.9)
Monocytes: 5 %
Neutrophils Absolute: 5.3 10*3/uL (ref 1.4–7.0)
Neutrophils: 61 %
Platelets: 338 10*3/uL (ref 150–450)
RBC: 4.73 x10E6/uL (ref 3.77–5.28)
RDW: 12.7 % (ref 11.7–15.4)
WBC: 8.8 10*3/uL (ref 3.4–10.8)

## 2023-07-29 LAB — LIPID PANEL
Chol/HDL Ratio: 4.6 {ratio} — ABNORMAL HIGH (ref 0.0–4.4)
Cholesterol, Total: 278 mg/dL — ABNORMAL HIGH (ref 100–199)
HDL: 61 mg/dL (ref >39)
LDL Chol Calc (NIH): 173 mg/dL — ABNORMAL HIGH (ref 0–99)
Triglycerides: 235 mg/dL — ABNORMAL HIGH (ref 0–149)
VLDL Cholesterol Cal: 44 mg/dL — ABNORMAL HIGH (ref 5–40)

## 2023-07-29 LAB — TSH: TSH: 2.47 u[IU]/mL (ref 0.450–4.500)

## 2023-07-29 MED ORDER — ZOLPIDEM TARTRATE 5 MG PO TABS: 5.0000 mg | ORAL_TABLET | Freq: Every evening | ORAL | 5 refills | Status: DC | PRN

## 2023-07-29 MED ORDER — METFORMIN HCL 500 MG PO TABS: 500.0000 mg | ORAL_TABLET | Freq: Two times a day (BID) | ORAL | 1 refills | Status: AC

## 2023-07-29 MED ORDER — SERTRALINE HCL 50 MG PO TABS: 50.0000 mg | ORAL_TABLET | Freq: Every day | ORAL | 1 refills | Status: DC

## 2023-07-29 NOTE — Progress Notes (Signed)
Labs look good. No concerns. Will fax surgical clearance today.

## 2023-07-29 NOTE — Progress Notes (Signed)
Cholesterol is elevated.   10 year risk below is not terrible but I would consider starting a statin. Even with weight loss your LDL and TG are elevated.   [name].The 10-year ASCVD risk score (Arnett DK, et al., 2019) is: 3.5%   Values used to calculate the score:     Age: 52 years     Sex: Female     Is Non-Hispanic African American: No     Diabetic: No     Tobacco smoker: No     Systolic Blood Pressure: 135 mmHg     Is BP treated: Yes     HDL Cholesterol: 61 mg/dL     Total Cholesterol: 278 mg/dL

## 2023-08-08 ENCOUNTER — Telehealth: Payer: Self-pay | Admitting: Physician Assistant

## 2023-08-08 ENCOUNTER — Encounter: Payer: Self-pay | Admitting: Physician Assistant

## 2023-08-08 NOTE — Telephone Encounter (Unsigned)
Copied from CRM [id number]. Topic: Medical Record Request - Provider/Facility Request >> Aug 08, 2023  1:21 PM Fonda Kinder J wrote: Reason for CRM: [facility] called in stating they received the pt's lab work but did not receive the EKG strip. She is asking that it be sent over via fax Attention: Felicity Coyer [phone]. Can follow up at [phone]

## 2023-08-19 ENCOUNTER — Ambulatory Visit: Payer: No Typology Code available for payment source

## 2023-08-27 ENCOUNTER — Other Ambulatory Visit: Payer: Self-pay | Admitting: Physician Assistant

## 2023-08-28 ENCOUNTER — Telehealth: Payer: Self-pay

## 2023-08-28 NOTE — Telephone Encounter (Addendum)
Prior auth for: Mounjaro 10 mg & 12.5 mg Case #: [id number] Determination: DENIED Reason: Plan exclusion. Insurance does not authorize classifications of weight loss medications. Informed by the insurance, patient will need to pay full retail price at the pharmacy.  Patient notified via a Mychart msg.

## 2023-08-29 ENCOUNTER — Ambulatory Visit: Payer: No Typology Code available for payment source | Admitting: Physician Assistant

## 2023-08-29 MED ORDER — ZEPBOUND 12.5 MG/0.5ML ~~LOC~~ SOAJ: 12.5000 mg | SUBCUTANEOUS | 0 refills | Status: DC

## 2023-09-01 ENCOUNTER — Telehealth: Payer: Self-pay

## 2023-09-01 NOTE — Telephone Encounter (Signed)
Prior auth for: Zepbound 12.5 mg Determination: DENIED - plan exclusion. Attempted to contact [facility] for more information. Informed that their fax system is down. Per the Rep, we can try to re-submit auth at a later time.  Auth #: [id number] Valid from: n/a Patient notified via MyChart

## 2023-09-07 ENCOUNTER — Other Ambulatory Visit: Payer: Self-pay | Admitting: Physician Assistant

## 2023-09-07 DIAGNOSIS — I10 Essential (primary) hypertension: Secondary | ICD-10-CM

## 2023-09-07 DIAGNOSIS — I493 Ventricular premature depolarization: Secondary | ICD-10-CM

## 2023-09-17 ENCOUNTER — Ambulatory Visit: Payer: No Typology Code available for payment source

## 2023-09-17 DIAGNOSIS — Z1231 Encounter for screening mammogram for malignant neoplasm of breast: Secondary | ICD-10-CM | POA: Diagnosis not present

## 2023-09-25 ENCOUNTER — Telehealth: Payer: Self-pay

## 2023-09-25 NOTE — Telephone Encounter (Signed)
Copied from CRM [id number]. Topic: Clinical - Prescription Issue >> Sep 25, 2023 10:02 AM Antwanette L wrote: Reason for CRM: Patients insurance company is requesting a prior authorization for tirzepatide (ZEPBOUND) 12.5 MG/0.5ML Pen. Patient is currently out of the medication Patient can be contacted by mychart or by phone [phone]. Listed below is the patient preferred pharmacy  [facility] [address] [place], [place] [address] Phone number [phone] Fax number [phone]

## 2023-10-01 ENCOUNTER — Encounter: Payer: Self-pay | Admitting: Physician Assistant

## 2023-10-02 NOTE — Telephone Encounter (Signed)
 Patient called office requesting to check status of Prior Auth for tirzepatide (Zepbound) 12.5mg . Pt is frustrated that no insurance has not received any information. Please advise.  CB:[phone]

## 2023-10-08 ENCOUNTER — Encounter: Payer: Self-pay | Admitting: Physician Assistant

## 2023-10-09 ENCOUNTER — Telehealth: Payer: Self-pay | Admitting: Physician Assistant

## 2023-10-09 NOTE — Telephone Encounter (Signed)
 Copied from CRM [id number]. Topic: Complaint (DO NOT CONVERT) - Care >> Oct 08, 2023  4:48 PM Shon Hale wrote: Date of Incident: Starting November 2024 going onto today Details of complaint: Patient states this started in November w/ [facility] Patient informed Greggory Keen is not covered by insurance (weighloss medications not covered under patient's plan) Patient switched to zepbound because of this. Patient very upset about PA request for zepbound. Patient has sent multiple messages about prior authorization, patient called [facility], her insurance & HR at her job. Patient told that zepbound would be covered and prior authorization is needed. Patient states she called earlier this month. and told that the person in charge of PA requests is "really busy because it is January". Patient has also included screenshots that state medication is covered.   Patient very upset as she usually has not had issues with getting any prescriptions.    How would the patient like to see it resolved? Patient requesting a call back as soon as possible and for someone to work on prior authorization.   On a scale of 1-10, how was your experience? 0  What would it take to bring it to a 10? Prior authorization to be worked on.   Route to Research officer, political party.

## 2023-10-13 ENCOUNTER — Encounter: Payer: Self-pay | Admitting: Physician Assistant

## 2023-10-13 NOTE — Telephone Encounter (Signed)
 Prior auth for: ZEPBOUND 12.5 MG  Determination: Pending as of 10/13/23. Request placed as urgent. Auth #: [id number] Valid from: n/a Patient notified via telephone call

## 2023-10-13 NOTE — Telephone Encounter (Signed)
 Pt called office requesting to check status of Zepbund 12.5mg  prior authorization. Patient expressed frustration regarding Prior Auth and was told by insurance company that no PA had been received. Patient was told that previous PA was denied due to plan exclusion and drug was not covered.   Patient aware that office would contact her insurance company to expedite Prior Auth redetermination. Pt expressed understanding.

## 2023-10-20 ENCOUNTER — Other Ambulatory Visit (HOSPITAL_COMMUNITY)
Admission: RE | Admit: 2023-10-20 | Discharge: 2023-10-20 | Disposition: A | Discharge status: Home / Self Care | Source: Ambulatory Visit | Attending: Obstetrics & Gynecology | Admitting: Obstetrics & Gynecology

## 2023-10-20 ENCOUNTER — Encounter (HOSPITAL_BASED_OUTPATIENT_CLINIC_OR_DEPARTMENT_OTHER): Payer: Self-pay | Admitting: Obstetrics & Gynecology

## 2023-10-20 ENCOUNTER — Ambulatory Visit (HOSPITAL_BASED_OUTPATIENT_CLINIC_OR_DEPARTMENT_OTHER): Payer: No Typology Code available for payment source | Admitting: Obstetrics & Gynecology

## 2023-10-20 VITALS — BP 127/87 | HR 89 | Ht 65.0 in | Wt 197.8 lb

## 2023-10-20 DIAGNOSIS — Z803 Family history of malignant neoplasm of breast: Secondary | ICD-10-CM | POA: Diagnosis not present

## 2023-10-20 DIAGNOSIS — Z780 Asymptomatic menopausal state: Secondary | ICD-10-CM

## 2023-10-20 DIAGNOSIS — Z124 Encounter for screening for malignant neoplasm of cervix: Secondary | ICD-10-CM | POA: Insufficient documentation

## 2023-10-20 DIAGNOSIS — Z01419 Encounter for gynecological examination (general) (routine) without abnormal findings: Secondary | ICD-10-CM

## 2023-10-20 NOTE — Telephone Encounter (Signed)
 Left a detailed message for the patient to contact the office and for someone to read the following instructions to her : Go into [employer] and select the tirzepatide 12.5 mg/0.5 ML Pen - 90 day supply and then they will send Prior Auth to CoverMyMeds so we can begin the PA process. According to the rep at [facility] , the Prior authorization was never sent to covermymeds.

## 2023-10-20 NOTE — Progress Notes (Unsigned)
 ANNUAL EXAM Patient name: [name] MRN [id number]  Date of birth: [birth year] Chief Complaint:   Annual Exam  History of Present Illness:   [name] is a 53 y.o. [id number] Caucasian female being seen today for a routine annual exam.  Has been working with PCP for prior authorization of zepbound.  Was on Mounjaro and has lost weight.  Would like to maintain what she has already lost.    Patient's last menstrual period was 02/06/2021.  She is PMP.    Last pap 01/17/2021. Results were: NILM w/ HRHPV negative. H/O abnormal pap: yes Last mammogram: 09/17/2023. Results were: normal. Family h/o breast cancer: yes mother Last colonoscopy: 12/2022. Follow up 3 years.       10/20/2023    2:57 PM 10/15/2022    1:57 PM 08/19/2022   11:24 AM 01/25/2022    4:01 PM 12/26/2020    4:01 PM  Depression screen PHQ 2/9  Decreased Interest 0 0 0 0 0  Down, Depressed, Hopeless 0 0 0 0 0  PHQ - 2 Score 0 0 0 0 0  Altered sleeping   3  1  Tired, decreased energy   2  1  Change in appetite   0  0  Feeling bad or failure about yourself    0  0  Trouble concentrating   0  0  Moving slowly or fidgety/restless   0  0  Suicidal thoughts   0  0  PHQ-9 Score   5  2  Difficult doing work/chores   Somewhat difficult  Not difficult at all    Review of Systems:   Pertinent items are noted in HPI  Denies any vaginal bleeding, vaginal discharge, pelvic pain, bowel changes.  She does have urinary urgency.    Pertinent History Reviewed:  Reviewed past medical,surgical, social and family history.   Reviewed problem list, medications and allergies. Physical Assessment:   Vitals:   10/20/23 1450  BP: 127/87  Pulse: 89  Weight: 197 lb 12.8 oz (89.7 kg)  Height: 5\' 5"  (1.651 m)  Body mass index is 32.92 kg/m.        Physical Examination:   General appearance - well appearing, and in no distress  Mental status - alert, oriented to person, place, and time  Psych:  She has a normal mood and  affect  Skin - warm and dry, normal color, no suspicious lesions noted  Chest - effort normal, all lung fields clear to auscultation bilaterally  Heart - normal rate and regular rhythm  Neck:  midline trachea, no thyromegaly or nodules  Breasts - breasts appear normal, no suspicious masses, no skin or nipple changes or  axillary nodes  Abdomen - soft, nontender, nondistended, no masses or organomegaly  Pelvic - VULVA: normal appearing vulva with no masses, tenderness or lesions   VAGINA: normal appearing vagina with normal color and discharge, no lesions   CERVIX: normal appearing cervix without discharge or lesions, no CMT  Thin prep pap with HR HPV obtained today.  UTERUS: uterus is felt to be normal size, shape, consistency and nontender   ADNEXA: No adnexal masses or tenderness noted.  Rectal - normal rectal, good sphincter tone, no masses felt  Extremities:  No swelling or varicosities noted  Chaperone present for exam  No results found for this or any previous visit (from the past 24 hours).  Assessment & Plan:  1. Well woman exam with routine gynecological exam (Primary) - Pap  smear 01/2021.  Updated today. - Mammogram 09/17/2023 - Colonoscopy 12/2022.  Follow up 3 years. - Bone mineral density not indicated at this time - lab work done with PCP, Tandy Gaw - vaccines reviewed/updated  2. Family history of breast cancer -will proceed with breast MRI later this year.  Reminder placed.  Alternating MMG/MRI every 6 months.  Due around early September.    3. Postmenopausal - on veozah.  No HRT.     Meds: No orders of the defined types were placed in this encounter.   Follow-up: Return in about 1 year (around 10/19/2024).  [name] Bears, MD 10/24/2023 12:01 AM

## 2023-10-22 LAB — CYTOLOGY - PAP
Comment: NEGATIVE
Diagnosis: NEGATIVE
High risk HPV: NEGATIVE

## 2023-10-23 ENCOUNTER — Encounter (HOSPITAL_BASED_OUTPATIENT_CLINIC_OR_DEPARTMENT_OTHER): Payer: Self-pay | Admitting: Obstetrics & Gynecology

## 2023-10-24 ENCOUNTER — Encounter (HOSPITAL_BASED_OUTPATIENT_CLINIC_OR_DEPARTMENT_OTHER): Payer: Self-pay | Admitting: Obstetrics & Gynecology

## 2023-10-24 NOTE — Telephone Encounter (Signed)
 The original PA was for [facility] - which was denied because the patient does not have a diagnosis of Diabetes. Zepbound has been denied as well. I had already faxed in all the necessary documents to the insurance for an expedited appeal to reverse the denial. It is still under a medical review.

## 2023-10-24 NOTE — Telephone Encounter (Signed)
 Spoke with lashondra-  Answered missing questions to appeal form that was submitted on 10/14/23 Faxed additional office notes. To fax# [phone] Case # [id number] States case should be determined by March 19th if not sooner.

## 2023-10-24 NOTE — Telephone Encounter (Signed)
 Per the provider's request - an expedited appeal was faxed to the insurance on 10/21/23.

## 2023-11-05 ENCOUNTER — Other Ambulatory Visit: Payer: Self-pay | Admitting: Physician Assistant

## 2023-11-05 DIAGNOSIS — F411 Generalized anxiety disorder: Secondary | ICD-10-CM

## 2023-11-05 DIAGNOSIS — F5101 Primary insomnia: Secondary | ICD-10-CM

## 2023-12-05 NOTE — Telephone Encounter (Signed)
 Please assist with prior auth request. Multiple prior auths were denied. All forms and necessary documents were sent to the insurance at time of the process with no luck. Thanks in advance.

## 2023-12-05 NOTE — Telephone Encounter (Signed)
 Noted.

## 2023-12-05 NOTE — Telephone Encounter (Signed)
 Duplicate msg.

## 2023-12-05 NOTE — Telephone Encounter (Signed)
 Patient has been updated in other telephone encounters regarding the PA process for Zepbound .

## 2023-12-05 NOTE — Telephone Encounter (Signed)
 Duplicated messaging. Case is being handled by the Rx auth team.

## 2023-12-08 ENCOUNTER — Telehealth: Payer: Self-pay

## 2023-12-08 ENCOUNTER — Other Ambulatory Visit (HOSPITAL_COMMUNITY): Payer: Self-pay

## 2023-12-08 NOTE — Telephone Encounter (Signed)
 Pharmacy Patient Advocate Encounter   Received notification from Patient Advice Request messages that prior authorization for Zepbound  12.5MG /0.5ML pen-injectors is required/requested.   Insurance verification completed.   The patient is insured through [employer] .   Per test claim: PA required; PA submitted to above mentioned insurance via CoverMyMeds Key/confirmation #/EOC Zepbound  12.5MG /0.5ML pen-injectors Status is pending

## 2023-12-09 ENCOUNTER — Other Ambulatory Visit (HOSPITAL_COMMUNITY): Payer: Self-pay

## 2023-12-10 NOTE — Telephone Encounter (Signed)
 This request has been handled. No further action is required. Please review other telephone encounters for additional information.

## 2023-12-11 NOTE — Telephone Encounter (Deleted)
 PA has been submitted and documented in separate encounter, please sign off on rx in this encounter as PA team is unable to resolve RX requests. Thank you

## 2023-12-15 NOTE — Telephone Encounter (Signed)
 FYI - Zepbound  has been denied per the RX auth team. Patient was also denied coverage for Mounjaro  rx due to not meeting the criteria guidelines.

## 2023-12-15 NOTE — Telephone Encounter (Signed)
 Pharmacy Patient Advocate Encounter  Received notification from [facility] that Prior Authorization for Zepbound  12.5mg /0.59ml has been DENIED.  Full denial letter will be uploaded to the media tab. See denial reason below.   PA #/Case ID/Reference #: [id number]

## 2023-12-17 ENCOUNTER — Telehealth: Payer: Self-pay | Admitting: Pharmacist

## 2023-12-17 NOTE — Telephone Encounter (Signed)
 Appeal has been submitted and documented in separate encounter.  Thank you

## 2023-12-17 NOTE — Telephone Encounter (Signed)
 Appeal has been submitted for Zepbound  12.5mg . Will advise when response is received, please be advised that most companies may take 30 days to make a decision. Appeal letter and supporting information has been faxed to [phone] on 12/17/2023 @12 :22 pm.  Thank you, Dene Fines, PharmD Clinical Pharmacist  [place]  Direct Dial: [phone]

## 2023-12-17 NOTE — Telephone Encounter (Signed)
 Additional information has been requested from the patient's insurance in order to proceed with the appeal request. Requested information has been sent, or form has been filled out and faxed back to [phone]    Appeal has been submitted for Zepbound  12.5mg . Will advise when response is received, please be advised that most companies may take 30 days to make a decision. Appeal letter and supporting information has been faxed to [phone] on 12/17/2023 @12 :22 pm.      Thank you, Dene Fines, PharmD Clinical Pharmacist  [place]  Direct Dial: [phone]

## 2023-12-24 ENCOUNTER — Ambulatory Visit: Payer: Self-pay

## 2023-12-24 NOTE — Telephone Encounter (Signed)
  Chief Complaint: palpitations Symptoms: palpitations Frequency: sporadic, chronic issue Pertinent Negatives: Patient denies SOB, CP,  Disposition: [] ED /[] Urgent Care (no appt availability in office) / [x] Appointment(In office/virtual)/ []  [facility]/ [] Home Care/ [] Refused Recommended Disposition /[] [place] [facility]/ []  Follow-up with PCP Additional Notes: Pt states that she has been seen by PCP for this. Pt states it was under control with her bystolic , pt states that she has started taking super B vitamin. Scheduled for 5/16 with PCP.  Copied from CRM [id number]. Topic: Clinical - Red Word Triage >> Dec 24, 2023 12:01 PM Blair Bumpers wrote: Red Word that prompted transfer to Nurse Triage: Patient wanting to schedule with Dr. Lindaann Requena. States she is not feeling well & is having heart palpitations. Patient states they have been happening for a while not but has kind of gotten worse as of yesterday & she's a little fatigued. Reason for Disposition  Palpitations are a chronic symptom (recurrent or ongoing AND present > 4 weeks)  Answer Assessment - Initial Assessment Questions 1. DESCRIPTION: "Please describe your heart rate or heartbeat that you are having" (e.g., fast/slow, regular/irregular, skipped or extra beats, "palpitations")     palpitations 2. ONSET: "When did it start?" (Minutes, hours or days)      Ongoing issue 3. DURATION: "How long does it last" (e.g., seconds, minutes, hours)     briefly 4. PATTERN "Does it come and go, or has it been constant since it started?"  "Does it get worse with exertion?"   "Are you feeling it now?"     intermittent 6. HEART RATE: "Can you tell me your heart rate?" "How many beats in 15 seconds?"  (Note: not all patients can do this)       50-60, pt states that her RHR is 74 7. RECURRENT SYMPTOM: "Have you ever had this before?" If Yes, ask: "When was the last time?" and "What happened that time?"      Yes this is ongoing , treated  with bystolic  8. CAUSE: "What do you think is causing the palpitations?"     unsure 9. CARDIAC HISTORY: "Do you have any history of heart disease?" (e.g., heart attack, angina, bypass surgery, angioplasty, arrhythmia)      pvc 10. OTHER SYMPTOMS: "Do you have any other symptoms?" (e.g., dizziness, chest pain, sweating, difficulty breathing)       denies  Protocols used: Heart Rate and Heartbeat Questions-A-AH

## 2023-12-26 ENCOUNTER — Ambulatory Visit: Admitting: Physician Assistant

## 2023-12-26 ENCOUNTER — Encounter: Payer: Self-pay | Admitting: Physician Assistant

## 2023-12-26 ENCOUNTER — Ambulatory Visit: Attending: Physician Assistant

## 2023-12-26 VITALS — BP 129/86 | HR 71 | Ht 65.0 in | Wt 204.0 lb

## 2023-12-26 DIAGNOSIS — I493 Ventricular premature depolarization: Secondary | ICD-10-CM

## 2023-12-26 DIAGNOSIS — Z6833 Body mass index (BMI) 33.0-33.9, adult: Secondary | ICD-10-CM

## 2023-12-26 DIAGNOSIS — E66811 Obesity, class 1: Secondary | ICD-10-CM

## 2023-12-26 DIAGNOSIS — R635 Abnormal weight gain: Secondary | ICD-10-CM

## 2023-12-26 DIAGNOSIS — R002 Palpitations: Secondary | ICD-10-CM

## 2023-12-26 MED ORDER — TIRZEPATIDE 10 MG/0.5ML ~~LOC~~ SOAJ
SUBCUTANEOUS | Status: AC
Start: 2023-12-26 — End: ?

## 2023-12-26 NOTE — Patient Instructions (Signed)
 Cut bystolic  in half Will make referral to cardiology Will get zio patch ordered Get labs today Start liposlim  Premature Ventricular Contraction  A premature ventricular contraction (PVC) is a type of irregular heartbeat (arrhythmia). The heart has four chambers, including the upper chambers (atria) and lower chambers (ventricles). Normally, an electrical signal starts in a group of cells called the sinoatrial node (SA node) and travels through the atria, causing them to pump blood into the ventricles. During a PVC, the heartbeat starts in one of the lower ventricles. This may cause the heartbeat to be shorter and less effective. In most cases, PVCs come and go and do not require treatment. What are the causes? Common causes of the condition include: Heart attack or coronary artery disease (CAD). Heart valve problems. Heart surgery. Infection of the heart (myocarditis). Inflammation of the heart. In many cases, the cause of this condition is not known. What increases the risk? The following factors may make you more likely to develop this condition: Age, especially being over age 2. Being female. An imbalance of salts and minerals in the body (electrolytes). Low blood oxygen levels or high carbon dioxide levels. Certain medicines, including over-the-counter and prescribed medicines. High blood pressure. Obesity. Episodes may be triggered by: Vigorous exercise. Tobacco, alcohol, or caffeine use. Illegal drug use. Emotional stress. Poor or irregular sleep. What are the signs or symptoms? The main symptoms of this condition are fast or irregular heartbeats (palpitations) or the feeling of a pause in the heartbeat. Other symptoms include: Shortness of breath. Difficulty exercising. Chest pain. Feeling tired. Dizziness. In some cases, there are no symptoms. How is this diagnosed? This condition may be diagnosed based on: Your medical history or symptoms. A physical exam. Your  health care provider may listen to your heart. Tests, such as: Blood tests. An ECG (electrocardiogram) to monitor the electrical activity of your heart. An ambulatory cardiac monitor that records your heartbeats for 24 hours or more. Stress tests to see how exercise affects your heart rhythm and blood supply. An echocardiogram, which creates an image of your heart. An electrophysiology study (EPS) to check for electrical problems in your heart. How is this treated? Treatment for this condition depends on any underlying conditions, the type of PVC, how many PVCs you have had, and if the symptoms are affecting your daily life. Possible treatments include: Avoiding things that cause PVCs (triggers). These include caffeine, tobacco, and alcohol. Taking medicines if symptoms are severe or if the arrhythmias happen a lot. Getting treatment for underlying conditions that cause PVCs. Having an implantable cardioverter defibrillator (ICD) placed in the chest to monitor the heartbeat. The monitor sends a shock to the heart if it senses an arrhythmia and brings the heartbeat back to normal. Having a catheter ablation procedure to destroy the part of the heart tissue that sends abnormal signals. In many cases, no treatment is required. Follow these instructions at home: Lifestyle  Do not use any products that contain nicotine or tobacco. These products include cigarettes, chewing tobacco, and vaping devices, such as e-cigarettes. If you need help quitting, ask your health care provider. Do not use illegal drugs. Exercise regularly. Ask your health care provider what type of exercise is safe for you. Try to get at least 7-9 hours of sleep each night. Find healthy ways to manage stress. Avoid stressful situations when possible. Alcohol use Do not drink alcohol if: Your health care provider tells you not to drink. You are pregnant, may be pregnant, or  are planning to become pregnant. Alcohol triggers  your episodes. If you drink alcohol: Limit how much you have to: 0-1 drink a day for women. 0-2 drinks a day for men. Know how much alcohol is in your drink. In the [place] one drink equals one 12 oz bottle of beer (355 mL), one 5 oz glass of wine (148 mL), or one 1 oz glass of hard liquor (44 mL). General instructions Take over-the-counter and prescription medicines only as told by your health care provider. If caffeine triggers episodes of PVC, do not eat, drink, or use anything with caffeine in it. Contact a health care provider if: You feel palpitations often. You have nausea and vomiting. Get help right away if: You have chest pain. You have trouble breathing. You start sweating for no reason. You become light-headed or you faint. These symptoms may be an emergency. Get help right away. Call 911. Do not wait to see if the symptoms will go away. Do not drive yourself to the hospital. This information is not intended to replace advice given to you by your health care provider. Make sure you discuss any questions you have with your health care provider. Document Revised: 12/28/2021 Document Reviewed: 12/28/2021 Elsevier Patient Education  2024 [employer].

## 2023-12-26 NOTE — Progress Notes (Signed)
 Acute Office Visit  Subjective:     Patient ID: [name], female    DOB: Jun 07, 1971, 53 y.o.   MRN: [id number]  Chief Complaint  Patient presents with   Medical Management of Chronic Issues    Chest pain palpitations    HPI Patient is in today for palpitations for the last week. 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 She has seen cardiology once before but has been a while. Pt denies any chest pain. She has gained about 25lbs since January after having to come off mounjaro /zepbound  due to insurance not paying for it. She is very active. She admits to drinking more alcohol last weekend but normal just a few drinks every few weeks. Pt does not drink any caffeine. Palpitations are intermittent and worse at night. To get them to stop she has to lay on her chest in the bed. This is very concerning for her.    .. Active Ambulatory Problems    Diagnosis Date Noted   Anxiety state 06/08/2007   Essential hypertension 06/08/2007   PALPITATIONS 04/26/2008   Bilateral lower abdominal cramping 11/04/2007   LUMBAR STRAIN 06/10/2007   Breast mass, right 01/19/2014   Sinus tachycardia 01/26/2014   Hyperlipidemia 01/31/2014   PVC's (premature ventricular contractions) 05/31/2014   Weight gain 09/19/2014   Other fatigue 09/19/2014   Dry skin 09/19/2014   OSA (obstructive sleep apnea) 04/03/2015   Family history of breast cancer 12/12/2015   Family history of melanoma 12/12/2015   Right thyroid  nodule 01/29/2016   Uveitis, intermediate 02/29/2016   Mass in neck 05/21/2016   Chronic fatigue syndrome 09/17/2016   Obesity (BMI 35.0-39.9 without comorbidity) 09/19/2016   Drug-induced constipation 11/18/2016   Dyslipidemia (high LDL; low HDL) 01/13/2017   Vitamin D  deficiency 09/14/2017   Prediabetes 02/13/2018   Colon cancer high risk 02/13/2018   Insulin resistance 06/21/2018   Depressed mood 10/27/2018   Left foot pain 09/29/2019   COVID-19 virus infection 11/02/2019    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/13/2020   Metabolic syndrome 12/13/2020   At risk for side effect of medication 12/13/2020   Class 1 obesity due to excess calories without serious comorbidity with body mass index (BMI) of 33.0 to 33.9 in adult 12/27/2020   Acne vulgaris 12/27/2020   At risk for activity intolerance 01/01/2021   Uterine fibroid 01/02/2021   Diarrhea 03/14/2021   Left lower quadrant abdominal pain 03/14/2021   Bilateral hearing loss 03/30/2021   Tinnitus of both ears 03/30/2021   Acute right-sided low back pain with right-sided sciatica 06/19/2021   Protrusion of lumbar intervertebral disc 06/20/2021   Preoperative clearance 01/29/2022   Primary insomnia 08/19/2022   Menopausal hot flushes 07/29/2023   Resolved Ambulatory Problems    Diagnosis Date Noted   DUB (dysfunctional uterine bleeding) 06/28/2015   Redness of left eye 12/12/2015   Pelvic pain 02/13/2018   Class 1 obesity with serious comorbidity and body mass index (BMI) of 34.0 to 34.9 in adult 10/27/2018   Menorrhagia with irregular cycle 12/26/2020   Thickened endometrium 01/02/2021   Irregular bleeding    Past Medical History:  Diagnosis Date   Anemia    Anxiety    Arthritis    Back pain    Gallbladder problem    High cholesterol    Hypertension    Hypothyroidism    IBS (irritable bowel syndrome)    Joint pain    Low vitamin D  level    Obesity (BMI 30-39.9)  PCOS (polycystic ovarian syndrome)        ROS See HPI.      Objective:    BP 129/86   Pulse 71   Ht 5\' 5"  (1.651 m)   Wt 204 lb (92.5 kg)   LMP 02/06/2021 Comment: UPT negative 02/21/21  SpO2 99%   BMI 33.95 kg/m  BP Readings from Last 3 Encounters:  12/26/23 129/86  10/20/23 127/87  07/29/23 135/87   Wt Readings from Last 3 Encounters:  12/26/23 204 lb (92.5 kg)  10/20/23 197 lb 12.8 oz (89.7 kg)  07/29/23 190 lb 11.2 oz (86.5 kg)      Physical Exam Constitutional:      Appearance: Normal appearance. She is  obese.  HENT:     Head: Normocephalic.  Cardiovascular:     Rate and Rhythm: Normal rate and regular rhythm.  Pulmonary:     Effort: Pulmonary effort is normal.     Breath sounds: Normal breath sounds.  Neurological:     General: No focal deficit present.     Mental Status: She is alert and oriented to person, place, and time.  Psychiatric:        Mood and Affect: Mood normal.           Assessment & Plan:  Aaron AasAaron AasTherasa was seen today for medical management of chronic issues.  Diagnoses and all orders for this visit:  Palpitations -     EKG 12-Lead -     Hemoglobin A1c -     CMP14+EGFR -     CBC w/Diff/Platelet -     Fe+TIBC+Fer -     TSH + free T4 -     LONG TERM MONITOR (3-14 DAYS); Future -     Ambulatory referral to Cardiology  PVC's (premature ventricular contractions) -     Hemoglobin A1c -     CMP14+EGFR -     CBC w/Diff/Platelet -     Fe+TIBC+Fer -     TSH + free T4 -     LONG TERM MONITOR (3-14 DAYS); Future -     Ambulatory referral to Cardiology  Weight gain -     Hemoglobin A1c -     CMP14+EGFR -     CBC w/Diff/Platelet -     Fe+TIBC+Fer -     TSH + free T4 -     tirzepatide  (MOUNJARO ) 10 MG/0.5ML Pen; Liposlim.  Tirzepatide /Pyridoxine/Thiamine/L-Carnitine 10mg /mL.  Inject 2.5 mg/25 units subcu weekly for 4 weeks then 5 mg/50 units subcu weekly for 4 weeks then 7.5 mg/75 units subcu weekly for 4 weeks then 10 mg/100 units subcu weekly for 4 weeks then 15 mg/150 units subcu weekly  Class 1 obesity due to excess calories without serious comorbidity with body mass index (BMI) of 33.0 to 33.9 in adult -     Hemoglobin A1c -     CMP14+EGFR -     CBC w/Diff/Platelet -     Fe+TIBC+Fer -     TSH + free T4 -     tirzepatide  (MOUNJARO ) 10 MG/0.5ML Pen; Liposlim.  Tirzepatide /Pyridoxine/Thiamine/L-Carnitine 10mg /mL.  Inject 2.5 mg/25 units subcu weekly for 4 weeks then 5 mg/50 units subcu weekly for 4 weeks then 7.5 mg/75 units subcu weekly for 4 weeks then 10  mg/100 units subcu weekly for 4 weeks then 15 mg/150 units subcu weekly   Vitals look good today, reassurance given  Palpitations have increased. Not taking bystolic  could also increase palpitations Start back 1/2 tablet of bystolic  Discussed  other triggers such as stress, weight gain, alcohol EKG reassuring today no ST elevation or depression or abnormal rhythms Will order zio patch  Will get labs to look for any metabolic causes for palpitations such as thyroid  disease Will make referral to cardiology  Pt gained 25lbs since being off GLP-1 Ok for compounding liposlim Discussed risk vs benefits and how to titrate up to 100units weekly to equal 10mg  weekly Follow up in 3 months  Sandy Crumb, PA-C

## 2023-12-26 NOTE — Progress Notes (Unsigned)
 EP to read.

## 2023-12-27 LAB — CBC WITH DIFFERENTIAL/PLATELET
Basophils Absolute: 0.1 10*3/uL (ref 0.0–0.2)
Basos: 1 %
EOS (ABSOLUTE): 0.2 10*3/uL (ref 0.0–0.4)
Eos: 2 %
Hematocrit: 44.2 % (ref 34.0–46.6)
Hemoglobin: 14.8 g/dL (ref 11.1–15.9)
Immature Grans (Abs): 0 10*3/uL (ref 0.0–0.1)
Immature Granulocytes: 0 %
Lymphocytes Absolute: 2.6 10*3/uL (ref 0.7–3.1)
Lymphs: 29 %
MCH: 31.7 pg (ref 26.6–33.0)
MCHC: 33.5 g/dL (ref 31.5–35.7)
MCV: 95 fL (ref 79–97)
Monocytes Absolute: 0.6 10*3/uL (ref 0.1–0.9)
Monocytes: 7 %
Neutrophils Absolute: 5.5 10*3/uL (ref 1.4–7.0)
Neutrophils: 61 %
Platelets: 320 10*3/uL (ref 150–450)
RBC: 4.67 x10E6/uL (ref 3.77–5.28)
RDW: 13.2 % (ref 11.7–15.4)
WBC: 9 10*3/uL (ref 3.4–10.8)

## 2023-12-27 LAB — CMP14+EGFR
ALT: 46 IU/L — ABNORMAL HIGH (ref 0–32)
AST: 40 IU/L (ref 0–40)
Albumin: 4.7 g/dL (ref 3.8–4.9)
Alkaline Phosphatase: 83 IU/L (ref 44–121)
BUN/Creatinine Ratio: 12 (ref 9–23)
BUN: 9 mg/dL (ref 6–24)
Bilirubin Total: 0.4 mg/dL (ref 0.0–1.2)
CO2: 20 mmol/L (ref 20–29)
Calcium: 9.7 mg/dL (ref 8.7–10.2)
Chloride: 102 mmol/L (ref 96–106)
Creatinine, Ser: 0.73 mg/dL (ref 0.57–1.00)
Globulin, Total: 2.5 g/dL (ref 1.5–4.5)
Glucose: 100 mg/dL — ABNORMAL HIGH (ref 70–99)
Potassium: 4.7 mmol/L (ref 3.5–5.2)
Sodium: 138 mmol/L (ref 134–144)
Total Protein: 7.2 g/dL (ref 6.0–8.5)
eGFR: 98 mL/min/{1.73_m2} (ref >59)

## 2023-12-27 LAB — HEMOGLOBIN A1C
Est. average glucose Bld gHb Est-mCnc: 108 mg/dL
Hgb A1c MFr Bld: 5.4 % (ref 4.8–5.6)

## 2023-12-27 LAB — IRON,TIBC AND FERRITIN PANEL
Ferritin: 84 ng/mL (ref 15–150)
Iron Saturation: 25 % (ref 15–55)
Iron: 95 ug/dL (ref 27–159)
Total Iron Binding Capacity: 383 ug/dL (ref 250–450)
UIBC: 288 ug/dL (ref 131–425)

## 2023-12-27 LAB — TSH+FREE T4
Free T4: 0.92 ng/dL (ref 0.82–1.77)
TSH: 2.42 u[IU]/mL (ref 0.450–4.500)

## 2023-12-29 ENCOUNTER — Ambulatory Visit: Payer: Self-pay | Admitting: Physician Assistant

## 2023-12-29 DIAGNOSIS — I4729 Other ventricular tachycardia: Secondary | ICD-10-CM

## 2023-12-29 DIAGNOSIS — R002 Palpitations: Secondary | ICD-10-CM

## 2023-12-29 DIAGNOSIS — I493 Ventricular premature depolarization: Secondary | ICD-10-CM

## 2023-12-29 NOTE — Progress Notes (Signed)
 Kc,   ALT, liver enzyme, up some. Could be weight gain. Could be the alcohol from the weekend before. Will recheck in 3 months.  A1C normal range.  Serum iron and iron stores look good.

## 2023-12-31 ENCOUNTER — Other Ambulatory Visit (HOSPITAL_COMMUNITY): Payer: Self-pay

## 2023-12-31 NOTE — Telephone Encounter (Signed)
 The insurance has approved the appeal for Zepbound :    Thank you, Dene Fines, PharmD Clinical Pharmacist  [place]  Direct Dial: [phone]

## 2024-01-01 MED ORDER — ZEPBOUND 2.5 MG/0.5ML ~~LOC~~ SOAJ: 2.5000 mg | SUBCUTANEOUS | 0 refills | Status: AC

## 2024-01-01 MED ORDER — ZEPBOUND 7.5 MG/0.5ML ~~LOC~~ SOAJ: 7.5000 mg | SUBCUTANEOUS | 0 refills | Status: AC

## 2024-01-01 MED ORDER — ZEPBOUND 5 MG/0.5ML ~~LOC~~ SOAJ: 5.0000 mg | SUBCUTANEOUS | 0 refills | Status: AC

## 2024-01-01 NOTE — Addendum Note (Signed)
 Addended by: Araceli Knight on: 01/01/2024 07:32 AM   Modules accepted: Orders

## 2024-01-01 NOTE — Telephone Encounter (Signed)
 Left message advising of approval.

## 2024-01-22 DIAGNOSIS — R002 Palpitations: Secondary | ICD-10-CM | POA: Diagnosis not present

## 2024-01-22 DIAGNOSIS — I493 Ventricular premature depolarization: Secondary | ICD-10-CM | POA: Diagnosis not present

## 2024-01-23 DIAGNOSIS — I4729 Other ventricular tachycardia: Secondary | ICD-10-CM | POA: Insufficient documentation

## 2024-01-23 NOTE — Progress Notes (Signed)
 HR 55 to 182, average 75. 1 nonsustained VT lasting 6 beats. Rare supraventricular and ventricular ectopy. No sustained arrhythmias. No atrial fibrillation. Symptom trigger episodes correspond to sinus rhythm and NSVT.  On episode of concerning rhythm. You do need to make sure you stay on bystolic  for rate control. Do you have appt with cardiology coming up?

## 2024-02-22 ENCOUNTER — Other Ambulatory Visit: Payer: Self-pay | Admitting: Physician Assistant

## 2024-02-22 DIAGNOSIS — F411 Generalized anxiety disorder: Secondary | ICD-10-CM

## 2024-03-03 ENCOUNTER — Encounter: Payer: Self-pay | Admitting: Physician Assistant

## 2024-03-08 NOTE — Telephone Encounter (Signed)
 Address shows [place]

## 2024-03-16 ENCOUNTER — Encounter (HOSPITAL_BASED_OUTPATIENT_CLINIC_OR_DEPARTMENT_OTHER): Payer: Self-pay | Admitting: Obstetrics & Gynecology

## 2024-03-17 ENCOUNTER — Other Ambulatory Visit (HOSPITAL_BASED_OUTPATIENT_CLINIC_OR_DEPARTMENT_OTHER): Payer: Self-pay | Admitting: Obstetrics & Gynecology

## 2024-03-17 DIAGNOSIS — Z9189 Other specified personal risk factors, not elsewhere classified: Secondary | ICD-10-CM

## 2024-03-17 DIAGNOSIS — Z803 Family history of malignant neoplasm of breast: Secondary | ICD-10-CM

## 2024-03-17 NOTE — Progress Notes (Signed)
 MRI order placed.

## 2024-03-30 ENCOUNTER — Other Ambulatory Visit: Payer: Self-pay | Admitting: Physician Assistant

## 2024-03-30 DIAGNOSIS — F411 Generalized anxiety disorder: Secondary | ICD-10-CM

## 2024-04-13 ENCOUNTER — Encounter: Payer: Self-pay | Admitting: Sports Medicine

## 2024-04-22 ENCOUNTER — Other Ambulatory Visit: Payer: Self-pay | Admitting: Physician Assistant

## 2024-04-22 DIAGNOSIS — I493 Ventricular premature depolarization: Secondary | ICD-10-CM

## 2024-04-22 DIAGNOSIS — I10 Essential (primary) hypertension: Secondary | ICD-10-CM

## 2024-05-21 ENCOUNTER — Encounter (HOSPITAL_BASED_OUTPATIENT_CLINIC_OR_DEPARTMENT_OTHER): Payer: Self-pay | Admitting: Obstetrics & Gynecology

## 2024-05-21 ENCOUNTER — Encounter (HOSPITAL_BASED_OUTPATIENT_CLINIC_OR_DEPARTMENT_OTHER): Payer: Self-pay

## 2024-06-15 ENCOUNTER — Other Ambulatory Visit: Payer: Self-pay | Admitting: Physician Assistant

## 2024-06-15 DIAGNOSIS — F411 Generalized anxiety disorder: Secondary | ICD-10-CM

## 2024-06-16 ENCOUNTER — Encounter: Payer: Self-pay | Admitting: Physician Assistant

## 2024-06-17 ENCOUNTER — Other Ambulatory Visit: Payer: Self-pay

## 2024-06-17 DIAGNOSIS — F411 Generalized anxiety disorder: Secondary | ICD-10-CM

## 2024-06-17 DIAGNOSIS — F5101 Primary insomnia: Secondary | ICD-10-CM

## 2024-06-17 MED ORDER — TRETINOIN 0.1 % EX CREA: TOPICAL_CREAM | Freq: Every day | CUTANEOUS | 1 refills | Status: AC

## 2024-06-18 ENCOUNTER — Telehealth: Payer: Self-pay

## 2024-06-18 DIAGNOSIS — Z0184 Encounter for antibody response examination: Secondary | ICD-10-CM

## 2024-06-18 MED ORDER — ALPRAZOLAM 0.5 MG PO TABS: ORAL_TABLET | ORAL | 1 refills | Status: AC

## 2024-06-18 MED ORDER — ZOLPIDEM TARTRATE 5 MG PO TABS
5.0000 mg | ORAL_TABLET | Freq: Every evening | ORAL | 1 refills | Status: AC | PRN
Start: 2024-06-18 — End: ?

## 2024-06-18 NOTE — Telephone Encounter (Signed)
 Copied from CRM [id number]. Topic: Clinical - Lab/Test Results >> Jun 18, 2024 12:42 PM Emylou G wrote: Reason for CRM: Patient needs to do tb test, with results, order labs for antibodies ( titer ), and flu shot exemption letter.  Pls order labs and can she do all this at once?

## 2024-06-22 ENCOUNTER — Telehealth: Payer: Self-pay

## 2024-06-22 NOTE — Telephone Encounter (Signed)
 Copied from CRM [id number]. Topic: Clinical - Lab/Test Results >> Jun 18, 2024 12:42 PM [name] wrote: Reason for CRM: Patient needs to do tb test, with results, order labs for antibodies ( titer ), and flu shot exemption letter.  Pls order labs and can she do all this at once? >> Jun 22, 2024  1:32 PM [name] wrote: Patient is calling back to follow up on her request for labs. Can someone give patient a call when labs have been put in? [phone].

## 2024-06-22 NOTE — Telephone Encounter (Signed)
 Patient informed that lab orders are in place in her chart. And she come by the office at her convenience to have these drawn   As far as the flu shot exemption letter.  Patient states she had discussed with Jade Breeback,PA in the past that she had a previous flu shot with Dr. Delight that caused tachycardia- she states that she already sees a cardiologists for arrhythmias and PVC's and is on Bystolic .  She states she was hoping Vermell Bologna, [place] could write her an exemption letter based on this.

## 2024-06-23 NOTE — Telephone Encounter (Signed)
 Ok for excemption based on previous side effect to flu shot with palpitations and PVC

## 2024-06-24 NOTE — Telephone Encounter (Signed)
 Patient informed that letter will be written.  Letter not yet written  Will send pt Mychart message once this is written

## 2024-06-25 ENCOUNTER — Ambulatory Visit: Payer: Self-pay | Admitting: Physician Assistant

## 2024-06-25 NOTE — Progress Notes (Signed)
 With patient  history of reaction to past flu shot - am I o.k. to schedule her a nurse visit for administration of MMR and Hep B ?

## 2024-06-25 NOTE — Progress Notes (Signed)
 TB screening still pending.  Not immune to hep B or Mumps will need boosters Immune to varicella, measles, rubeola

## 2024-06-26 LAB — MEASLES/MUMPS/RUBELLA IMMUNITY
MUMPS ABS, IGG: <9 [AU]/ml — ABNORMAL LOW (ref >10.9)
RUBEOLA AB, IGG: 22.9 [AU]/ml (ref >16.4)
Rubella Antibodies, IGG: 4.21 {index} (ref >0.99)

## 2024-06-26 LAB — QFT-TB PLUS (CLIENT INCUBATED)
QuantiFERON Mitogen Value: >10 [IU]/mL
QuantiFERON Nil Value: 0.02 [IU]/mL
QuantiFERON TB1 Ag Value: 0.04 [IU]/mL
QuantiFERON TB2 Ag Value: 0.05 [IU]/mL
QuantiFERON-TB Gold Plus: NEGATIVE

## 2024-06-26 LAB — HEPATITIS B SURFACE ANTIBODY, QUANTITATIVE: Hepatitis B Surf Ab Quant: <3.5 m[IU]/mL — ABNORMAL LOW

## 2024-06-26 LAB — VARICELLA ZOSTER ANTIBODY, IGG: Varicella zoster IgG: REACTIVE

## 2024-06-28 NOTE — Progress Notes (Signed)
 Negative TB screening

## 2024-06-30 ENCOUNTER — Encounter (HOSPITAL_BASED_OUTPATIENT_CLINIC_OR_DEPARTMENT_OTHER): Payer: Self-pay | Admitting: Obstetrics & Gynecology

## 2024-07-05 ENCOUNTER — Ambulatory Visit
Admission: RE | Admit: 2024-07-05 | Discharge: 2024-07-05 | Disposition: A | Discharge status: Home / Self Care | Source: Ambulatory Visit | Attending: Obstetrics & Gynecology | Admitting: Obstetrics & Gynecology

## 2024-07-05 DIAGNOSIS — Z803 Family history of malignant neoplasm of breast: Secondary | ICD-10-CM

## 2024-07-05 DIAGNOSIS — Z9189 Other specified personal risk factors, not elsewhere classified: Secondary | ICD-10-CM

## 2024-07-05 MED ORDER — GADOPICLENOL 0.5 MMOL/ML IV SOLN
9.0000 mL | Freq: Once | INTRAVENOUS | Status: AC | PRN
  Administered 2024-07-05: 9 mL via INTRAVENOUS

## 2024-07-06 ENCOUNTER — Ambulatory Visit (HOSPITAL_BASED_OUTPATIENT_CLINIC_OR_DEPARTMENT_OTHER): Payer: Self-pay | Admitting: Obstetrics & Gynecology

## 2024-07-12 ENCOUNTER — Ambulatory Visit: Attending: Cardiology | Admitting: Cardiology

## 2024-07-12 ENCOUNTER — Encounter: Payer: Self-pay | Admitting: Cardiology

## 2024-07-12 ENCOUNTER — Other Ambulatory Visit (HOSPITAL_COMMUNITY): Payer: Self-pay

## 2024-07-12 VITALS — BP 116/80 | HR 84 | Ht 65.0 in | Wt 199.0 lb

## 2024-07-12 DIAGNOSIS — I493 Ventricular premature depolarization: Secondary | ICD-10-CM | POA: Diagnosis not present

## 2024-07-12 DIAGNOSIS — R0609 Other forms of dyspnea: Secondary | ICD-10-CM | POA: Diagnosis not present

## 2024-07-12 DIAGNOSIS — I4729 Other ventricular tachycardia: Secondary | ICD-10-CM

## 2024-07-12 DIAGNOSIS — E782 Mixed hyperlipidemia: Secondary | ICD-10-CM | POA: Diagnosis not present

## 2024-07-12 MED ORDER — METOPROLOL TARTRATE 100 MG PO TABS
100.0000 mg | ORAL_TABLET | Freq: Once | ORAL | 0 refills | Status: AC
  Filled 2024-07-12: qty 1, 1d supply, fill #0

## 2024-07-12 NOTE — Patient Instructions (Addendum)
 Medication Instructions:  ON DAY OF CT: metoprolol tartrate (LOPRESSOR) 100 MG tablet         Take 1 tablet (100 mg total) by mouth once for 1 dose. Take 90-120 minutes prior to scan. Hold for SBP less than 110.   *If you need a refill on your cardiac medications before your next appointment, please call your pharmacy*.c Lab Work: Bmp Fasting lipid panel   If you have labs (blood work) drawn today and your tests are completely normal, you will receive your results only by: MyChart Message (if you have MyChart) OR A paper copy in the mail If you have any lab test that is abnormal or we need to change your treatment, we will call you to review the results.  Testing/Procedures: Coronary CTA  Your physician has requested that you have cardiac CT. Cardiac computed tomography (CT) is a painless test that uses an x-ray machine to take clear, detailed pictures of your heart. For further information please visit [email or web address] Please follow instruction sheet as given.   Echocardiogram   Your physician has requested that you have an echocardiogram. Echocardiography is a painless test that uses sound waves to create images of your heart. It provides your doctor with information about the size and shape of your heart and how well your heart's chambers and valves are working. This procedure takes approximately one hour. There are no restrictions for this procedure. Please do NOT wear cologne, perfume, aftershave, or lotions (deodorant is allowed). Please arrive 15 minutes prior to your appointment time.  Please note: We ask at that you not bring children with you during ultrasound (echo/ vascular) testing. Due to room size and safety concerns, children are not allowed in the ultrasound rooms during exams. Our front office staff cannot provide observation of children in our lobby area while testing is being conducted. An adult accompanying a patient to their appointment will only be allowed in the  ultrasound room at the discretion of the ultrasound technician under special circumstances. We apologize for any inconvenience.  Follow-Up: At [facility], you and your health needs are our priority.  As part of our continuing mission to provide you with exceptional heart care, our providers are all part of one team.  This team includes your primary Cardiologist (physician) and Advanced Practice Providers or APPs (Physician Assistants and Nurse Practitioners) who all work together to provide you with the care you need, when you need it.  Your next appointment:   3 month(s)  Provider:   Newman JINNY Lawrence, MD    Other Instructions   Your cardiac CT will be scheduled at one of the below locations:   [facility] [address]  [place], [place] [address]  If scheduled at the [facility] at [address], please enter the parking lot using the [address] entrance and use the FREE valet service at the patient drop-off area. Enter the building and check-in with registration on the main floor.   Please follow these instructions carefully (unless otherwise directed):  An IV will be required for this test and Nitroglycerin will be given.  Hold all erectile dysfunction medications at least 3 days (72 hrs) prior to test. (Ie viagra, cialis, sildenafil, tadalafil, etc)   On the Night Before the Test: Be sure to Drink plenty of water. Do not consume any caffeinated/decaffeinated beverages or chocolate 12 hours prior to your test. Do not take any antihistamines 12 hours prior to your test.  On the Day of the Test: Drink plenty of water until 1 hour prior to the test. Do not eat any food 1 hour prior to test. You may take your regular medications prior to the test.  Take metoprolol (Lopressor)100mg   two hours prior to test. If you take Furosemide/Hydrochlorothiazide/Spironolactone/Chlorthalidone, please HOLD on the morning of the  test. Patients who wear a continuous glucose monitor MUST remove the device prior to scanning. FEMALES- please wear underwire-free bra if available, avoid dresses & tight clothing      After the Test: Drink plenty of water. After receiving IV contrast, you may experience a mild flushed feeling. This is normal. On occasion, you may experience a mild rash up to 24 hours after the test. This is not dangerous. If this occurs, you can take Benadryl 25 mg, Zyrtec, Claritin, or Allegra and increase your fluid intake. (Patients taking Tikosyn should avoid Benadryl, and may take Zyrtec, Claritin, or Allegra) If you experience trouble breathing, this can be serious. If it is severe call 911 IMMEDIATELY. If it is mild, please call our office.  We will call to schedule your test 2-4 weeks out understanding that some insurance companies will need an authorization prior to the service being performed.   For more information and frequently asked questions, please visit our website : [email or web address]  For non-scheduling related questions, please contact the cardiac imaging nurse navigator should you have any questions/concerns: Cardiac Imaging Nurse Navigators Direct Office Dial: [phone]   For scheduling needs, including cancellations and rescheduling, please call Brittany, [phone].

## 2024-07-12 NOTE — Progress Notes (Signed)
 Cardiology Office Note:  .   Date:  07/12/2024  ID:  [name], DOB [birth year], MRN [id number] PCP: Antoniette Vermell LITTIE DEVONNA  [facility] Providers Cardiologist:  Newman Lawrence, MD PCP: Antoniette Vermell LITTIE, PA-C  Chief Complaint  Patient presents with   Palpitations   Dyspnea on exertion     [name] is a 53 y.o. female with hypertension, hyperlipidemia, type 2 diabetes mellitus, PVC, NSVT, dyspnea on exertion  Discussed the use of AI scribe software for clinical note transcription with the patient, who gave verbal consent to proceed.  History of Present Illness [name] is a 53 year old female with PVCs and anxiety who presents with shortness of breath and swelling in the legs.  She has had PVCs since 1998, with a recent monitor showing six beats of non-sustained ventricular tachycardia. She takes Bystolic  along with Zoloft  and Xanax , which had controlled symptoms, but PVCs have increased again around perimenopausal hormonal changes with associated hot flashes. She describes them as a strong kick to the chest. Alcohol worsens symptoms with next-day tachycardia. She avoids caffeine and rarely eats chocolate. She carries Xanax  for panic symptoms when traveling.  She reports new onset exertional shortness of breath when lifting her grandchildren, dancing, or climbing stairs. She denies chest pain.  She had ankle swelling during a conference in early October after prolonged standing and travel, which resolved with leg elevation overnight. She now uses compression stockings when traveling.  She quit smoking ten years ago. She denies family history of heart disease. Her cholesterol was last checked in December of last year.    Vitals:   07/12/24 1113  BP: 116/80  Pulse: 84  SpO2: 94%      Review of Systems  Cardiovascular:  Positive for dyspnea on exertion and palpitations. Negative for chest pain, leg swelling and syncope.         Studies Reviewed: [facility]        EKG 07/12/2024: Normal sinus rhythm Low voltage QRS When compared with ECG of 30-Aug-2006 12:53, T wave inversion now evident in Anterior leads     Monitor 01/2024: HR 55 to 182, average 75. 1 nonsustained VT lasting 6 beats. Rare supraventricular and ventricular ectopy. No sustained arrhythmias. No atrial fibrillation. Symptom trigger episodes correspond to sinus rhythm and NSVT.  Labs 07/2023-12/2023: Chol 278, TG 235, HDL 61, LDL 173 HbA1C 5.4% Hb 14.8 Cr 0.73, ALT 46 TSH 2.4   Physical Exam Vitals and nursing note reviewed.  Constitutional:      General: She is not in acute distress. Neck:     Vascular: No JVD.  Cardiovascular:     Rate and Rhythm: Normal rate and regular rhythm.     Heart sounds: Normal heart sounds. No murmur heard. Pulmonary:     Effort: Pulmonary effort is normal.     Breath sounds: Normal breath sounds. No wheezing or rales.  Musculoskeletal:     Right lower leg: No edema.     Left lower leg: No edema.      VISIT DIAGNOSES:   ICD-10-CM   1. NSVT (nonsustained ventricular tachycardia) (HCC)  I47.29 EKG 12-Lead    ECHOCARDIOGRAM COMPLETE    2. PVC (premature ventricular contraction)  I49.3 EKG 12-Lead    3. Mixed hyperlipidemia  E78.2 EKG 12-Lead    Lipid panel    CT CORONARY MORPH W/CTA COR W/SCORE W/CA W/CM &/OR WO/CM    metoprolol tartrate (LOPRESSOR) 100 MG tablet    4.  DOE (dyspnea on exertion)  R06.09 Basic metabolic panel with GFR    ECHOCARDIOGRAM COMPLETE    CT CORONARY MORPH W/CTA COR W/SCORE W/CA W/CM &/OR WO/CM    metoprolol tartrate (LOPRESSOR) 100 MG tablet       [name] is a 53 y.o. female with hypertension, hyperlipidemia, type 2 diabetes mellitus, PVC, NSVT, dyspnea on exertion Assessment & Plan Nonsustained ventricular tachycardia and frequent premature ventricular contractions: Long-standing PVCs with recent recurrence, possibly due to hormonal changes. Recent  nonsustained ventricular tachycardia episode. No significant family history of heart disease. Increased shortness of breath noted. - Continue Bystolic . - Ordered echocardiogram. - Advised to avoid alcohol and caffeine.  Mixed hyperlipidemia: High cholesterol levels noted last December. Diet high in red meat contributing to elevated cholesterol and triglycerides. Open to medication if necessary. - Ordered fasting lipid panel. - Advised dietary modifications to reduce red meat and increase fish, lean meats, vegetables, whole grains, and nuts. - Will discuss potential cholesterol medication based on results.  Dyspnea on exertion, possible angina equivalent: New onset shortness of breath with exertion. No chest pain. High cholesterol is a risk factor for coronary artery disease. Shortness of breath may be an angina equivalent. - Ordered coronary CT angiogram. - Ordered kidney function test within one month of CT angiogram. - Will administer metoprolol prior to CT angiogram. - Scheduled follow-up in three months.        Meds ordered this encounter  Medications   metoprolol tartrate (LOPRESSOR) 100 MG tablet    Sig: Take 1 tablet (100 mg total) by mouth once for 1 dose. Take 90-120 minutes prior to scan. Hold for SBP less than 110.    Dispense:  1 tablet    Refill:  0     F/u in 3 months  Signed, Newman JINNY Lawrence, MD

## 2024-07-14 ENCOUNTER — Ambulatory Visit: Payer: Self-pay | Admitting: Cardiology

## 2024-07-14 DIAGNOSIS — E782 Mixed hyperlipidemia: Secondary | ICD-10-CM

## 2024-07-14 LAB — BASIC METABOLIC PANEL WITH GFR
BUN/Creatinine Ratio: 13 (ref 9–23)
BUN: 10 mg/dL (ref 6–24)
CO2: 24 mmol/L (ref 20–29)
Calcium: 10.8 mg/dL — ABNORMAL HIGH (ref 8.7–10.2)
Chloride: 96 mmol/L (ref 96–106)
Creatinine, Ser: 0.76 mg/dL (ref 0.57–1.00)
Glucose: 85 mg/dL (ref 70–99)
Potassium: 4.8 mmol/L (ref 3.5–5.2)
Sodium: 139 mmol/L (ref 134–144)
eGFR: 93 mL/min/1.73 (ref >59)

## 2024-07-14 LAB — LIPID PANEL
Chol/HDL Ratio: 5.1 ratio — ABNORMAL HIGH (ref 0.0–4.4)
Cholesterol, Total: 283 mg/dL — ABNORMAL HIGH (ref 100–199)
HDL: 56 mg/dL (ref >39)
LDL Chol Calc (NIH): 181 mg/dL — ABNORMAL HIGH (ref 0–99)
Triglycerides: 245 mg/dL — ABNORMAL HIGH (ref 0–149)
VLDL Cholesterol Cal: 46 mg/dL — ABNORMAL HIGH (ref 5–40)

## 2024-07-14 NOTE — Progress Notes (Signed)
 Cholesterol remains elevated.  Recommend starting Crestor 20 mg daily.  Repeat lipid panel in 3 months.  Thanks MJP

## 2024-07-15 ENCOUNTER — Encounter: Payer: Self-pay | Admitting: Physician Assistant

## 2024-07-15 NOTE — Telephone Encounter (Signed)
 I am okay for you to start with Crestor 5 mg daily, but I suspect this may be too small of a dose to make significant reduction in your cholesterol.  We can always check lipid panel in 3 months and reassess. Your right, calcium  was elevated at 10.8.  Please discuss with your primary care provider if this would require any further test or any medications.  Thanks MJP

## 2024-07-15 NOTE — Telephone Encounter (Signed)
 Please advise if okay to start at 5 mg and looks like calcium  resulted as 10.8.

## 2024-07-16 ENCOUNTER — Other Ambulatory Visit: Payer: Self-pay | Admitting: Physician Assistant

## 2024-07-16 MED ORDER — ROSUVASTATIN CALCIUM 5 MG PO TABS: 5.0000 mg | ORAL_TABLET | Freq: Every day | ORAL | 0 refills | Status: AC

## 2024-07-20 ENCOUNTER — Ambulatory Visit: Payer: Self-pay | Admitting: Physician Assistant

## 2024-07-20 LAB — PTH, INTACT AND CALCIUM
Calcium: 9.6 mg/dL (ref 8.7–10.2)
PTH: 29 pg/mL (ref 15–65)

## 2024-07-20 LAB — VITAMIN D 25 HYDROXY (VIT D DEFICIENCY, FRACTURES): Vit D, 25-Hydroxy: 56.6 ng/mL (ref 30.0–100.0)

## 2024-07-20 NOTE — Progress Notes (Signed)
 [name],   Calcium  decreased into normal range. Waiting on parathyroid hormone to result.

## 2024-07-21 NOTE — Progress Notes (Signed)
 Last read by Rollene CHRISTELLA Freund at 8:59AM on 07/21/2024.

## 2024-07-21 NOTE — Progress Notes (Signed)
 PTH is normal.  Vitamin D  looks great.  No concerns.  Calcium  back in normal range.

## 2024-07-23 ENCOUNTER — Other Ambulatory Visit: Payer: Self-pay | Admitting: Physician Assistant

## 2024-07-23 DIAGNOSIS — Z79899 Other long term (current) drug therapy: Secondary | ICD-10-CM

## 2024-07-28 ENCOUNTER — Telehealth (HOSPITAL_COMMUNITY): Payer: Self-pay | Admitting: *Deleted

## 2024-07-28 NOTE — Telephone Encounter (Signed)
 Attempted to call patient regarding upcoming cardiac CT appointment. Left message on voicemail with name and callback number  Larey Brick RN Navigator Cardiac Imaging [facility] [phone] Office [phone] Cell

## 2024-07-29 ENCOUNTER — Ambulatory Visit (HOSPITAL_COMMUNITY)
Admission: RE | Admit: 2024-07-29 | Discharge: 2024-07-29 | Attending: Cardiovascular Disease | Admitting: Cardiovascular Disease

## 2024-07-29 DIAGNOSIS — R0609 Other forms of dyspnea: Secondary | ICD-10-CM | POA: Insufficient documentation

## 2024-07-29 DIAGNOSIS — E782 Mixed hyperlipidemia: Secondary | ICD-10-CM | POA: Diagnosis present

## 2024-07-29 MED ORDER — IOHEXOL 350 MG/ML SOLN
100.0000 mL | Freq: Once | INTRAVENOUS | Status: AC | PRN
  Administered 2024-07-29: 13:00:00 100 mL via INTRAVENOUS

## 2024-07-29 MED ORDER — NITROGLYCERIN 0.4 MG SL SUBL
0.8000 mg | SUBLINGUAL_TABLET | Freq: Once | SUBLINGUAL | Status: AC
  Administered 2024-07-29: 13:00:00 0.8 mg via SUBLINGUAL

## 2024-08-19 ENCOUNTER — Ambulatory Visit (HOSPITAL_COMMUNITY)

## 2024-09-11 ENCOUNTER — Other Ambulatory Visit: Payer: Self-pay | Admitting: Physician Assistant

## 2024-09-11 DIAGNOSIS — K760 Fatty (change of) liver, not elsewhere classified: Secondary | ICD-10-CM

## 2024-09-11 DIAGNOSIS — R7303 Prediabetes: Secondary | ICD-10-CM

## 2024-09-13 NOTE — Telephone Encounter (Signed)
 Please call pt and advise her that she will need to schedule an OV for medication refill of Metformin .

## 2024-09-20 ENCOUNTER — Ambulatory Visit: Admitting: Physician Assistant

## 2024-09-20 DIAGNOSIS — K760 Fatty (change of) liver, not elsewhere classified: Secondary | ICD-10-CM

## 2024-09-20 DIAGNOSIS — R7303 Prediabetes: Secondary | ICD-10-CM

## 2024-09-20 DIAGNOSIS — Z79899 Other long term (current) drug therapy: Secondary | ICD-10-CM

## 2024-09-23 ENCOUNTER — Ambulatory Visit (HOSPITAL_COMMUNITY)

## 2024-10-12 ENCOUNTER — Ambulatory Visit: Admitting: Cardiology

## 2024-10-25 ENCOUNTER — Ambulatory Visit (HOSPITAL_BASED_OUTPATIENT_CLINIC_OR_DEPARTMENT_OTHER): Admitting: Obstetrics & Gynecology
